# Patient Record
Sex: Male | Born: 1940
Health system: Southern US, Community
[De-identification: ages and names within clinical notes are randomized; demographics above are authoritative.]

## PROBLEM LIST (undated history)

## (undated) DIAGNOSIS — K219 Gastro-esophageal reflux disease without esophagitis: Secondary | ICD-10-CM

## (undated) DIAGNOSIS — J45909 Unspecified asthma, uncomplicated: Secondary | ICD-10-CM

## (undated) HISTORY — PX: EYE SURGERY: SHX253

## (undated) HISTORY — PX: HERNIA REPAIR: SHX51

---

## 1998-10-26 ENCOUNTER — Emergency Department (HOSPITAL_COMMUNITY): Admission: EM | Admit: 1998-10-26 | Discharge: 1998-10-27 | Payer: Self-pay

## 2000-07-13 ENCOUNTER — Ambulatory Visit (HOSPITAL_COMMUNITY): Admission: RE | Admit: 2000-07-13 | Discharge: 2000-07-13 | Payer: Self-pay | Admitting: *Deleted

## 2001-10-19 ENCOUNTER — Ambulatory Visit (HOSPITAL_COMMUNITY): Admission: RE | Admit: 2001-10-19 | Discharge: 2001-10-19 | Payer: Self-pay | Admitting: Gastroenterology

## 2005-11-23 ENCOUNTER — Ambulatory Visit (HOSPITAL_COMMUNITY): Admission: RE | Admit: 2005-11-23 | Discharge: 2005-11-23 | Payer: Self-pay | Admitting: General Surgery

## 2015-01-11 ENCOUNTER — Ambulatory Visit (INDEPENDENT_AMBULATORY_CARE_PROVIDER_SITE_OTHER): Payer: Medicare Other | Admitting: Family Medicine

## 2015-01-11 VITALS — BP 142/88 | HR 72 | Temp 98.0°F | Resp 18 | Ht 72.0 in | Wt 177.0 lb

## 2015-01-11 DIAGNOSIS — M79645 Pain in left finger(s): Secondary | ICD-10-CM

## 2015-01-11 DIAGNOSIS — L03012 Cellulitis of left finger: Secondary | ICD-10-CM

## 2015-01-11 MED ORDER — DOXYCYCLINE HYCLATE 100 MG PO TABS: 100.0000 mg | ORAL_TABLET | Freq: Two times a day (BID) | ORAL | Status: DC

## 2015-01-11 NOTE — Progress Notes (Signed)
° °  Subjective:    Patient ID: Chad Washington, male    DOB: 1941-12-20, 74 y.o.   MRN: [id number] This chart was scribed for Robyn Haber, MD by Marti Sleigh, Medical Scribe. This patient was seen in Room 13 and the patient's care was started a 8:33 AM.  Chief Complaint  Patient presents with   Hand Pain    left thumb, banged it couple of days ago, swelling since yesterday    HPI HPI Comments: Chad Washington is a 74 y.o. male who presents to [facility] complaining of an injury to his left thumb that happened two days ago. Pt endorses associated pain, swelling, discoloration, sleep disturbance due to pain, and limited ROM.  Pt is a retired Psychologist, sport and exercise at [employer].    Review of Systems  Constitutional: Negative for fever and chills.  Musculoskeletal: Positive for joint swelling.  Skin: Positive for color change and wound.  Psychiatric/Behavioral: Positive for sleep disturbance.       Objective:   Physical Exam  Constitutional: He is oriented to person, place, and time. He appears well-developed and well-nourished.  HENT:  Head: Normocephalic and atraumatic.  Eyes: Pupils are equal, round, and reactive to light.  Neck: No JVD present.  Cardiovascular: Normal rate and regular rhythm.   Pulmonary/Chest: Effort normal and breath sounds normal. No respiratory distress.  Neurological: He is alert and oriented to person, place, and time.  Skin: Skin is warm and dry.  Psychiatric: He has a normal mood and affect. His behavior is normal.  Nursing note and vitals reviewed. left thumb paronychia, quite large, I&D'd yielding copious yellow pus and patient relief     Assessment & Plan:   This chart was scribed in my presence and reviewed by me personally.    ICD-9-CM ICD-10-CM   1. Paronychia, left 681.9 L03.012 doxycycline (VIBRA-TABS) 100 MG tablet  2. Pain of left thumb 729.5 M79.645      Signed, Robyn Haber, MD

## 2015-01-11 NOTE — Patient Instructions (Signed)

## 2015-08-25 ENCOUNTER — Other Ambulatory Visit: Payer: Self-pay | Admitting: Internal Medicine

## 2015-08-25 ENCOUNTER — Ambulatory Visit
Admission: RE | Admit: 2015-08-25 | Discharge: 2015-08-25 | Disposition: A | Discharge status: Home / Self Care | Payer: Medicare Other | Source: Ambulatory Visit | Attending: Internal Medicine | Admitting: Internal Medicine

## 2015-08-25 DIAGNOSIS — R059 Cough, unspecified: Secondary | ICD-10-CM

## 2015-08-25 DIAGNOSIS — R05 Cough: Secondary | ICD-10-CM

## 2017-04-08 ENCOUNTER — Other Ambulatory Visit: Payer: Self-pay | Admitting: Gastroenterology

## 2017-06-15 ENCOUNTER — Encounter (HOSPITAL_COMMUNITY): Payer: Self-pay | Admitting: *Deleted

## 2017-06-17 ENCOUNTER — Encounter (HOSPITAL_COMMUNITY): Payer: Self-pay | Admitting: *Deleted

## 2017-06-21 ENCOUNTER — Ambulatory Visit (HOSPITAL_COMMUNITY): Payer: Medicare Other | Admitting: Anesthesiology

## 2017-06-21 ENCOUNTER — Ambulatory Visit (HOSPITAL_COMMUNITY)
Admission: RE | Admit: 2017-06-21 | Discharge: 2017-06-21 | Disposition: A | Discharge status: Home / Self Care | Payer: Medicare Other | Source: Ambulatory Visit | Attending: Gastroenterology | Admitting: Gastroenterology

## 2017-06-21 ENCOUNTER — Encounter (HOSPITAL_COMMUNITY): Payer: Self-pay | Admitting: Anesthesiology

## 2017-06-21 ENCOUNTER — Encounter (HOSPITAL_COMMUNITY)
Admission: RE | Disposition: A | Discharge status: Home / Self Care | Payer: Self-pay | Source: Ambulatory Visit | Attending: Gastroenterology

## 2017-06-21 DIAGNOSIS — L719 Rosacea, unspecified: Secondary | ICD-10-CM | POA: Diagnosis not present

## 2017-06-21 DIAGNOSIS — Z1211 Encounter for screening for malignant neoplasm of colon: Secondary | ICD-10-CM | POA: Insufficient documentation

## 2017-06-21 DIAGNOSIS — Z79899 Other long term (current) drug therapy: Secondary | ICD-10-CM | POA: Insufficient documentation

## 2017-06-21 DIAGNOSIS — D122 Benign neoplasm of ascending colon: Secondary | ICD-10-CM | POA: Diagnosis not present

## 2017-06-21 DIAGNOSIS — K219 Gastro-esophageal reflux disease without esophagitis: Secondary | ICD-10-CM | POA: Insufficient documentation

## 2017-06-21 DIAGNOSIS — Z7982 Long term (current) use of aspirin: Secondary | ICD-10-CM | POA: Insufficient documentation

## 2017-06-21 HISTORY — DX: Unspecified asthma, uncomplicated: J45.909

## 2017-06-21 HISTORY — PX: COLONOSCOPY WITH PROPOFOL: SHX5780

## 2017-06-21 HISTORY — DX: Gastro-esophageal reflux disease without esophagitis: K21.9

## 2017-06-21 SURGERY — COLONOSCOPY WITH PROPOFOL
Anesthesia: Monitor Anesthesia Care

## 2017-06-21 MED ORDER — PROPOFOL 500 MG/50ML IV EMUL
INTRAVENOUS | Status: DC | PRN
  Administered 2017-06-21: 140 ug/kg/min via INTRAVENOUS

## 2017-06-21 MED ORDER — SODIUM CHLORIDE 0.9 % IV SOLN: INTRAVENOUS | Status: DC

## 2017-06-21 MED ORDER — PROPOFOL 10 MG/ML IV BOLUS
INTRAVENOUS | Status: AC
  Filled 2017-06-21: qty 20

## 2017-06-21 MED ORDER — PROPOFOL 10 MG/ML IV BOLUS
INTRAVENOUS | Status: DC | PRN
  Administered 2017-06-21 (×2): 20 mg via INTRAVENOUS

## 2017-06-21 MED ORDER — LACTATED RINGERS IV SOLN
INTRAVENOUS | Status: DC
  Administered 2017-06-21: 1000 mL via INTRAVENOUS

## 2017-06-21 MED ORDER — LIDOCAINE 2% (20 MG/ML) 5 ML SYRINGE
INTRAMUSCULAR | Status: AC
  Filled 2017-06-21: qty 5

## 2017-06-21 SURGICAL SUPPLY — 21 items

## 2017-06-21 NOTE — Discharge Instructions (Signed)
Colonoscopy, Adult, Care After  This sheet gives you information about how to care for yourself after your procedure. Your health care provider may also give you more specific instructions. If you have problems or questions, contact your health care provider.  What can I expect after the procedure?  After the procedure, it is common to have:  · A small amount of blood in your stool for 24 hours after the procedure.  · Some gas.  · Mild abdominal cramping or bloating.    Follow these instructions at home:  General instructions    · For the first 24 hours after the procedure:  ? Do not drive or use machinery.  ? Do not sign important documents.  ? Do not drink alcohol.  ? Do your regular daily activities at a slower pace than normal.  ? Eat soft, easy-to-digest foods.  ? Rest often.  · Take over-the-counter or prescription medicines only as told by your health care provider.  · It is up to you to get the results of your procedure. Ask your health care provider, or the department performing the procedure, when your results will be ready.  Relieving cramping and bloating  · Try walking around when you have cramps or feel bloated.  · Apply heat to your abdomen as told by your health care provider. Use a heat source that your health care provider recommends, such as a moist heat pack or a heating pad.  ? Place a towel between your skin and the heat source.  ? Leave the heat on for 20-30 minutes.  ? Remove the heat if your skin turns bright red. This is especially important if you are unable to feel pain, heat, or cold. You may have a greater risk of getting burned.  Eating and drinking  · Drink enough fluid to keep your urine clear or pale yellow.  · Resume your normal diet as instructed by your health care provider. Avoid heavy or fried foods that are hard to digest.  · Avoid drinking alcohol for as long as instructed by your health care provider.  Contact a health care provider if:  · You have blood in your stool 2-3  days after the procedure.  Get help right away if:  · You have more than a small spotting of blood in your stool.  · You pass large blood clots in your stool.  · Your abdomen is swollen.  · You have nausea or vomiting.  · You have a fever.  · You have increasing abdominal pain that is not relieved with medicine.  This information is not intended to replace advice given to you by your health care provider. Make sure you discuss any questions you have with your health care provider.  Document Released: 07/27/2004 Document Revised: 09/06/2016 Document Reviewed: 02/24/2016  Elsevier Interactive Patient Education © 2018 Elsevier Inc.

## 2017-06-21 NOTE — Op Note (Signed)
[facility] Patient Name: Chad Washington Procedure Date: 06/21/2017 MRN: [id number] Attending MD: [name] , MD Date of Birth: 10/24/1941 CSN: [id number] Age: 76 Admit Type: Outpatient Procedure:                Colonoscopy Indications:              Screening for colorectal malignant neoplasm. Normal                            screening colonoscopy was performed on 01/05/2007. Providers:                [name], MD, Laverta Baltimore RN, RN,                            [name], Technician, Danley Danker, CRNA Referring MD:              Medicines:                Propofol per Anesthesia Complications:            No immediate complications. Estimated Blood Loss:     Estimated blood loss was minimal. Procedure:                Pre-Anesthesia Assessment:                           - Prior to the procedure, a History and Physical                            was performed, and patient medications and                            allergies were reviewed. The patient's tolerance of                            previous anesthesia was also reviewed. The risks                            and benefits of the procedure and the sedation                            options and risks were discussed with the patient.                            All questions were answered, and informed consent                            was obtained. Prior Anticoagulants: The patient has                            taken aspirin, last dose was 1 day prior to                            procedure. ASA Grade Assessment: II - A patient  with mild systemic disease. After reviewing the                            risks and benefits, the patient was deemed in                            satisfactory condition to undergo the procedure.                           After obtaining informed consent, the colonoscope                            was passed under direct vision.  Throughout the                            procedure, the patient's blood pressure, pulse, and                            oxygen saturations were monitored continuously. The                            EC-[id number] ([id number]) scope was introduced through                            the anus and advanced to the the cecum, identified                            by appendiceal orifice and ileocecal valve. The                            colonoscopy was performed without difficulty. The                            patient tolerated the procedure well. The quality                            of the bowel preparation was good. The appendiceal                            orifice and the rectum were photographed. Scope In: 7:25:04 AM Scope Out: 7:42:24 AM Scope Withdrawal Time: 0 hours 9 minutes 47 seconds  Total Procedure Duration: 0 hours 17 minutes 20 seconds  Findings:      The perianal and digital rectal examinations were normal.      A 3 mm polyp was found in the mid ascending colon. The polyp was       sessile. The polyp was removed with a cold biopsy forceps. Resection and       retrieval were complete.      The exam was otherwise without abnormality. Universal colonic       diverticulosis was present. Impression:               - One 3 mm polyp in the mid ascending colon,  removed with a cold biopsy forceps. Resected and                            retrieved.                           - The examination was otherwise normal. Moderate Sedation:      N/A- Per Anesthesia Care Recommendation:           - Patient has a contact number available for                            emergencies. The signs and symptoms of potential                            delayed complications were discussed with the                            patient. Return to normal activities tomorrow.                            Written discharge instructions were provided to the                             patient.                           - Repeat colonoscopy is not recommended for                            screening purposes.                           - Resume previous diet.                           - Continue present medications. Procedure Code(s):        --- Professional ---                           [phone], Colonoscopy, flexible; with biopsy, single                            or multiple Diagnosis Code(s):        --- Professional ---                           Z12.11, Encounter for screening for malignant                            neoplasm of colon                           D12.2, Benign neoplasm of ascending colon CPT copyright 2016 American Medical Association. All rights reserved. The codes documented in this report are preliminary and upon coder review may  be revised to meet current compliance requirements. Earle Gell, MD [name], MD 06/21/2017  7:49:20 AM This report has been signed electronically. Number of Addenda: 0

## 2017-06-21 NOTE — Anesthesia Preprocedure Evaluation (Signed)
Anesthesia Evaluation  Patient identified by MRN, date of birth, ID band Patient awake    Reviewed: Allergy & Precautions, NPO status , Patient's Chart, lab work & pertinent test results  Airway Mallampati: I       Dental no notable dental hx. (+) Teeth Intact   Pulmonary    Pulmonary exam normal breath sounds clear to auscultation       Cardiovascular negative cardio ROS Normal cardiovascular exam Rhythm:Regular Rate:Normal     Neuro/Psych negative neurological ROS  negative psych ROS   GI/Hepatic Neg liver ROS,   Endo/Other  negative endocrine ROS  Renal/GU negative Renal ROS  negative genitourinary   Musculoskeletal negative musculoskeletal ROS (+)   Abdominal Normal abdominal exam  (+)   Peds  Hematology negative hematology ROS (+)   Anesthesia Other Findings   Reproductive/Obstetrics                             Anesthesia Physical Anesthesia Plan  ASA: II  Anesthesia Plan: MAC   Post-op Pain Management:    Induction:   PONV Risk Score and Plan: 1 and Ondansetron, Dexamethasone and Treatment may vary due to age or medical condition  Airway Management Planned: Natural Airway and Simple Face Mask  Additional Equipment:   Intra-op Plan:   Post-operative Plan:   Informed Consent: I have reviewed the patients History and Physical, chart, labs and discussed the procedure including the risks, benefits and alternatives for the proposed anesthesia with the patient or authorized representative who has indicated his/her understanding and acceptance.     Plan Discussed with: CRNA and Surgeon  Anesthesia Plan Comments:         Anesthesia Quick Evaluation

## 2017-06-21 NOTE — Transfer of Care (Signed)
Immediate Anesthesia Transfer of Care Note  Patient: Chad Washington  Procedure(s) Performed: Procedure(s): COLONOSCOPY WITH PROPOFOL (N/A)  Patient Location: Endoscopy Unit  Anesthesia Type:MAC  Level of Consciousness: sedated  Airway & Oxygen Therapy: Patient Spontanous Breathing and Patient connected to face mask oxygen  Post-op Assessment: Report given to RN and Post -op Vital signs reviewed and stable  Post vital signs: Reviewed and stable  Last Vitals:  Vitals:   06/21/17 0647  BP: (!) 148/85  Pulse: 75  Resp: 16  Temp: 36.4 C    Last Pain:  Vitals:   06/21/17 0647  TempSrc: Oral         Complications: No apparent anesthesia complications

## 2017-06-21 NOTE — H&P (Signed)
Procedure: Screening colonoscopy. Normal screening colonoscopy was performed on 01/05/2007  History: The patient is a 76 year old male born 1941-10-04. He is scheduled to undergo a repeat screening colonoscopy today.  Past medical history: Gastroesophageal reflux. Rosacea. Tonsillectomy. Right inguinal hernia repair as a child. Left inguinal hernia repair performed in 2006. Sinus surgery. Bilateral cataract surgery.  Medication allergies: Latex gloves  Exam: Patient is alert and lying comfortably on the endoscopy stretcher. Abdomen is soft and nontender to palpation. Lungs are clear to auscultation. Cardiac exam reveals a regular rhythm.  Plan: Proceed with screening colonoscopy

## 2017-06-21 NOTE — Anesthesia Postprocedure Evaluation (Signed)
Anesthesia Post Note  Patient: Chad Washington  Procedure(s) Performed: Procedure(s) (LRB): COLONOSCOPY WITH PROPOFOL (N/A)     Patient location during evaluation: Endoscopy Anesthesia Type: MAC Level of consciousness: awake Pain management: pain level controlled Vital Signs Assessment: post-procedure vital signs reviewed and stable Respiratory status: spontaneous breathing Cardiovascular status: stable Postop Assessment: no signs of nausea or vomiting Anesthetic complications: no    Last Vitals:  Vitals:   06/21/17 0800 06/21/17 0810  BP: 114/70 126/72  Pulse: (!) 58 70  Resp: 16 13  Temp:      Last Pain:  Vitals:   06/21/17 0748  TempSrc: Oral   Pain Goal:                 Maximus Hoffert JR,JOHN Michaelangelo Mittelman

## 2017-06-24 ENCOUNTER — Encounter (HOSPITAL_COMMUNITY): Payer: Self-pay | Admitting: Gastroenterology

## 2017-11-15 ENCOUNTER — Ambulatory Visit
Admission: RE | Admit: 2017-11-15 | Discharge: 2017-11-15 | Disposition: A | Discharge status: Home / Self Care | Payer: Medicare Other | Source: Ambulatory Visit | Attending: Internal Medicine | Admitting: Internal Medicine

## 2017-11-15 ENCOUNTER — Other Ambulatory Visit: Payer: Self-pay | Admitting: Internal Medicine

## 2017-11-15 DIAGNOSIS — M545 Low back pain: Secondary | ICD-10-CM

## 2017-11-25 ENCOUNTER — Other Ambulatory Visit: Payer: Self-pay | Admitting: Internal Medicine

## 2017-11-25 DIAGNOSIS — M5416 Radiculopathy, lumbar region: Secondary | ICD-10-CM

## 2017-12-02 ENCOUNTER — Ambulatory Visit
Admission: RE | Admit: 2017-12-02 | Discharge: 2017-12-02 | Disposition: A | Discharge status: Home / Self Care | Payer: Medicare Other | Source: Ambulatory Visit | Attending: Internal Medicine | Admitting: Internal Medicine

## 2017-12-02 DIAGNOSIS — M5416 Radiculopathy, lumbar region: Secondary | ICD-10-CM

## 2020-05-02 DIAGNOSIS — J3089 Other allergic rhinitis: Secondary | ICD-10-CM | POA: Diagnosis not present

## 2020-05-02 DIAGNOSIS — J3081 Allergic rhinitis due to animal (cat) (dog) hair and dander: Secondary | ICD-10-CM | POA: Diagnosis not present

## 2020-05-02 DIAGNOSIS — J301 Allergic rhinitis due to pollen: Secondary | ICD-10-CM | POA: Diagnosis not present

## 2020-05-12 DIAGNOSIS — J3089 Other allergic rhinitis: Secondary | ICD-10-CM | POA: Diagnosis not present

## 2020-05-12 DIAGNOSIS — J3081 Allergic rhinitis due to animal (cat) (dog) hair and dander: Secondary | ICD-10-CM | POA: Diagnosis not present

## 2020-05-12 DIAGNOSIS — J301 Allergic rhinitis due to pollen: Secondary | ICD-10-CM | POA: Diagnosis not present

## 2020-05-13 DIAGNOSIS — J3089 Other allergic rhinitis: Secondary | ICD-10-CM | POA: Diagnosis not present

## 2020-05-13 DIAGNOSIS — J301 Allergic rhinitis due to pollen: Secondary | ICD-10-CM | POA: Diagnosis not present

## 2020-05-13 DIAGNOSIS — J3081 Allergic rhinitis due to animal (cat) (dog) hair and dander: Secondary | ICD-10-CM | POA: Diagnosis not present

## 2020-05-20 DIAGNOSIS — J3081 Allergic rhinitis due to animal (cat) (dog) hair and dander: Secondary | ICD-10-CM | POA: Diagnosis not present

## 2020-05-20 DIAGNOSIS — J3089 Other allergic rhinitis: Secondary | ICD-10-CM | POA: Diagnosis not present

## 2020-05-20 DIAGNOSIS — J301 Allergic rhinitis due to pollen: Secondary | ICD-10-CM | POA: Diagnosis not present

## 2020-05-28 DIAGNOSIS — J301 Allergic rhinitis due to pollen: Secondary | ICD-10-CM | POA: Diagnosis not present

## 2020-05-28 DIAGNOSIS — J3089 Other allergic rhinitis: Secondary | ICD-10-CM | POA: Diagnosis not present

## 2020-05-28 DIAGNOSIS — J3081 Allergic rhinitis due to animal (cat) (dog) hair and dander: Secondary | ICD-10-CM | POA: Diagnosis not present

## 2020-06-04 DIAGNOSIS — J301 Allergic rhinitis due to pollen: Secondary | ICD-10-CM | POA: Diagnosis not present

## 2020-06-04 DIAGNOSIS — J3081 Allergic rhinitis due to animal (cat) (dog) hair and dander: Secondary | ICD-10-CM | POA: Diagnosis not present

## 2020-06-04 DIAGNOSIS — J3089 Other allergic rhinitis: Secondary | ICD-10-CM | POA: Diagnosis not present

## 2020-06-11 DIAGNOSIS — J301 Allergic rhinitis due to pollen: Secondary | ICD-10-CM | POA: Diagnosis not present

## 2020-06-11 DIAGNOSIS — J3081 Allergic rhinitis due to animal (cat) (dog) hair and dander: Secondary | ICD-10-CM | POA: Diagnosis not present

## 2020-06-11 DIAGNOSIS — J3089 Other allergic rhinitis: Secondary | ICD-10-CM | POA: Diagnosis not present

## 2020-06-19 DIAGNOSIS — J301 Allergic rhinitis due to pollen: Secondary | ICD-10-CM | POA: Diagnosis not present

## 2020-06-19 DIAGNOSIS — J3089 Other allergic rhinitis: Secondary | ICD-10-CM | POA: Diagnosis not present

## 2020-06-19 DIAGNOSIS — J3081 Allergic rhinitis due to animal (cat) (dog) hair and dander: Secondary | ICD-10-CM | POA: Diagnosis not present

## 2020-06-26 DIAGNOSIS — J3089 Other allergic rhinitis: Secondary | ICD-10-CM | POA: Diagnosis not present

## 2020-06-26 DIAGNOSIS — J301 Allergic rhinitis due to pollen: Secondary | ICD-10-CM | POA: Diagnosis not present

## 2020-06-26 DIAGNOSIS — J3081 Allergic rhinitis due to animal (cat) (dog) hair and dander: Secondary | ICD-10-CM | POA: Diagnosis not present

## 2020-07-03 DIAGNOSIS — J301 Allergic rhinitis due to pollen: Secondary | ICD-10-CM | POA: Diagnosis not present

## 2020-07-03 DIAGNOSIS — J3089 Other allergic rhinitis: Secondary | ICD-10-CM | POA: Diagnosis not present

## 2020-07-03 DIAGNOSIS — J3081 Allergic rhinitis due to animal (cat) (dog) hair and dander: Secondary | ICD-10-CM | POA: Diagnosis not present

## 2020-07-11 DIAGNOSIS — J3081 Allergic rhinitis due to animal (cat) (dog) hair and dander: Secondary | ICD-10-CM | POA: Diagnosis not present

## 2020-07-11 DIAGNOSIS — J3089 Other allergic rhinitis: Secondary | ICD-10-CM | POA: Diagnosis not present

## 2020-07-11 DIAGNOSIS — J301 Allergic rhinitis due to pollen: Secondary | ICD-10-CM | POA: Diagnosis not present

## 2020-07-18 DIAGNOSIS — J3081 Allergic rhinitis due to animal (cat) (dog) hair and dander: Secondary | ICD-10-CM | POA: Diagnosis not present

## 2020-07-18 DIAGNOSIS — J301 Allergic rhinitis due to pollen: Secondary | ICD-10-CM | POA: Diagnosis not present

## 2020-07-18 DIAGNOSIS — J3089 Other allergic rhinitis: Secondary | ICD-10-CM | POA: Diagnosis not present

## 2020-07-24 DIAGNOSIS — J301 Allergic rhinitis due to pollen: Secondary | ICD-10-CM | POA: Diagnosis not present

## 2020-07-24 DIAGNOSIS — J3089 Other allergic rhinitis: Secondary | ICD-10-CM | POA: Diagnosis not present

## 2020-07-24 DIAGNOSIS — J3081 Allergic rhinitis due to animal (cat) (dog) hair and dander: Secondary | ICD-10-CM | POA: Diagnosis not present

## 2020-07-31 DIAGNOSIS — J301 Allergic rhinitis due to pollen: Secondary | ICD-10-CM | POA: Diagnosis not present

## 2020-07-31 DIAGNOSIS — J3081 Allergic rhinitis due to animal (cat) (dog) hair and dander: Secondary | ICD-10-CM | POA: Diagnosis not present

## 2020-07-31 DIAGNOSIS — J3089 Other allergic rhinitis: Secondary | ICD-10-CM | POA: Diagnosis not present

## 2020-08-11 DIAGNOSIS — Z961 Presence of intraocular lens: Secondary | ICD-10-CM | POA: Diagnosis not present

## 2020-08-11 DIAGNOSIS — J3081 Allergic rhinitis due to animal (cat) (dog) hair and dander: Secondary | ICD-10-CM | POA: Diagnosis not present

## 2020-08-11 DIAGNOSIS — J301 Allergic rhinitis due to pollen: Secondary | ICD-10-CM | POA: Diagnosis not present

## 2020-08-11 DIAGNOSIS — J3089 Other allergic rhinitis: Secondary | ICD-10-CM | POA: Diagnosis not present

## 2020-08-11 DIAGNOSIS — H43393 Other vitreous opacities, bilateral: Secondary | ICD-10-CM | POA: Diagnosis not present

## 2020-08-20 DIAGNOSIS — J3081 Allergic rhinitis due to animal (cat) (dog) hair and dander: Secondary | ICD-10-CM | POA: Diagnosis not present

## 2020-08-20 DIAGNOSIS — J301 Allergic rhinitis due to pollen: Secondary | ICD-10-CM | POA: Diagnosis not present

## 2020-08-20 DIAGNOSIS — J3089 Other allergic rhinitis: Secondary | ICD-10-CM | POA: Diagnosis not present

## 2020-09-02 DIAGNOSIS — J301 Allergic rhinitis due to pollen: Secondary | ICD-10-CM | POA: Diagnosis not present

## 2020-09-02 DIAGNOSIS — J3089 Other allergic rhinitis: Secondary | ICD-10-CM | POA: Diagnosis not present

## 2020-09-11 DIAGNOSIS — J3081 Allergic rhinitis due to animal (cat) (dog) hair and dander: Secondary | ICD-10-CM | POA: Diagnosis not present

## 2020-09-11 DIAGNOSIS — J3089 Other allergic rhinitis: Secondary | ICD-10-CM | POA: Diagnosis not present

## 2020-09-11 DIAGNOSIS — J301 Allergic rhinitis due to pollen: Secondary | ICD-10-CM | POA: Diagnosis not present

## 2020-09-17 DIAGNOSIS — J3081 Allergic rhinitis due to animal (cat) (dog) hair and dander: Secondary | ICD-10-CM | POA: Diagnosis not present

## 2020-09-17 DIAGNOSIS — J301 Allergic rhinitis due to pollen: Secondary | ICD-10-CM | POA: Diagnosis not present

## 2020-09-17 DIAGNOSIS — J3089 Other allergic rhinitis: Secondary | ICD-10-CM | POA: Diagnosis not present

## 2020-09-23 DIAGNOSIS — J3089 Other allergic rhinitis: Secondary | ICD-10-CM | POA: Diagnosis not present

## 2020-09-23 DIAGNOSIS — J301 Allergic rhinitis due to pollen: Secondary | ICD-10-CM | POA: Diagnosis not present

## 2020-09-23 DIAGNOSIS — R05 Cough: Secondary | ICD-10-CM | POA: Diagnosis not present

## 2020-09-23 DIAGNOSIS — J3081 Allergic rhinitis due to animal (cat) (dog) hair and dander: Secondary | ICD-10-CM | POA: Diagnosis not present

## 2020-10-06 DIAGNOSIS — L570 Actinic keratosis: Secondary | ICD-10-CM | POA: Diagnosis not present

## 2020-10-06 DIAGNOSIS — L905 Scar conditions and fibrosis of skin: Secondary | ICD-10-CM | POA: Diagnosis not present

## 2020-10-06 DIAGNOSIS — L814 Other melanin hyperpigmentation: Secondary | ICD-10-CM | POA: Diagnosis not present

## 2020-10-06 DIAGNOSIS — D229 Melanocytic nevi, unspecified: Secondary | ICD-10-CM | POA: Diagnosis not present

## 2020-10-06 DIAGNOSIS — B351 Tinea unguium: Secondary | ICD-10-CM | POA: Diagnosis not present

## 2020-10-06 DIAGNOSIS — Z85828 Personal history of other malignant neoplasm of skin: Secondary | ICD-10-CM | POA: Diagnosis not present

## 2020-10-06 DIAGNOSIS — D1801 Hemangioma of skin and subcutaneous tissue: Secondary | ICD-10-CM | POA: Diagnosis not present

## 2020-10-06 DIAGNOSIS — L819 Disorder of pigmentation, unspecified: Secondary | ICD-10-CM | POA: Diagnosis not present

## 2020-10-06 DIAGNOSIS — L821 Other seborrheic keratosis: Secondary | ICD-10-CM | POA: Diagnosis not present

## 2020-10-13 DIAGNOSIS — J3081 Allergic rhinitis due to animal (cat) (dog) hair and dander: Secondary | ICD-10-CM | POA: Diagnosis not present

## 2020-10-13 DIAGNOSIS — J301 Allergic rhinitis due to pollen: Secondary | ICD-10-CM | POA: Diagnosis not present

## 2020-10-13 DIAGNOSIS — J3089 Other allergic rhinitis: Secondary | ICD-10-CM | POA: Diagnosis not present

## 2020-10-20 DIAGNOSIS — Z0000 Encounter for general adult medical examination without abnormal findings: Secondary | ICD-10-CM | POA: Diagnosis not present

## 2020-10-20 DIAGNOSIS — K589 Irritable bowel syndrome without diarrhea: Secondary | ICD-10-CM | POA: Diagnosis not present

## 2020-10-20 DIAGNOSIS — E7800 Pure hypercholesterolemia, unspecified: Secondary | ICD-10-CM | POA: Diagnosis not present

## 2020-10-20 DIAGNOSIS — J309 Allergic rhinitis, unspecified: Secondary | ICD-10-CM | POA: Diagnosis not present

## 2020-10-20 DIAGNOSIS — N400 Enlarged prostate without lower urinary tract symptoms: Secondary | ICD-10-CM | POA: Diagnosis not present

## 2020-10-20 DIAGNOSIS — K219 Gastro-esophageal reflux disease without esophagitis: Secondary | ICD-10-CM | POA: Diagnosis not present

## 2020-10-23 DIAGNOSIS — J3081 Allergic rhinitis due to animal (cat) (dog) hair and dander: Secondary | ICD-10-CM | POA: Diagnosis not present

## 2020-10-23 DIAGNOSIS — J3089 Other allergic rhinitis: Secondary | ICD-10-CM | POA: Diagnosis not present

## 2020-10-23 DIAGNOSIS — J301 Allergic rhinitis due to pollen: Secondary | ICD-10-CM | POA: Diagnosis not present

## 2020-10-31 DIAGNOSIS — J3081 Allergic rhinitis due to animal (cat) (dog) hair and dander: Secondary | ICD-10-CM | POA: Diagnosis not present

## 2020-10-31 DIAGNOSIS — J301 Allergic rhinitis due to pollen: Secondary | ICD-10-CM | POA: Diagnosis not present

## 2020-10-31 DIAGNOSIS — J3089 Other allergic rhinitis: Secondary | ICD-10-CM | POA: Diagnosis not present

## 2020-11-12 DIAGNOSIS — J0191 Acute recurrent sinusitis, unspecified: Secondary | ICD-10-CM | POA: Diagnosis not present

## 2020-11-14 DIAGNOSIS — J301 Allergic rhinitis due to pollen: Secondary | ICD-10-CM | POA: Diagnosis not present

## 2020-11-14 DIAGNOSIS — J3089 Other allergic rhinitis: Secondary | ICD-10-CM | POA: Diagnosis not present

## 2020-11-14 DIAGNOSIS — J3081 Allergic rhinitis due to animal (cat) (dog) hair and dander: Secondary | ICD-10-CM | POA: Diagnosis not present

## 2020-11-27 DIAGNOSIS — J3089 Other allergic rhinitis: Secondary | ICD-10-CM | POA: Diagnosis not present

## 2020-11-27 DIAGNOSIS — J3081 Allergic rhinitis due to animal (cat) (dog) hair and dander: Secondary | ICD-10-CM | POA: Diagnosis not present

## 2020-11-27 DIAGNOSIS — J301 Allergic rhinitis due to pollen: Secondary | ICD-10-CM | POA: Diagnosis not present

## 2020-12-05 DIAGNOSIS — J3081 Allergic rhinitis due to animal (cat) (dog) hair and dander: Secondary | ICD-10-CM | POA: Diagnosis not present

## 2020-12-05 DIAGNOSIS — J3089 Other allergic rhinitis: Secondary | ICD-10-CM | POA: Diagnosis not present

## 2020-12-05 DIAGNOSIS — J301 Allergic rhinitis due to pollen: Secondary | ICD-10-CM | POA: Diagnosis not present

## 2020-12-12 DIAGNOSIS — J3089 Other allergic rhinitis: Secondary | ICD-10-CM | POA: Diagnosis not present

## 2020-12-12 DIAGNOSIS — J3081 Allergic rhinitis due to animal (cat) (dog) hair and dander: Secondary | ICD-10-CM | POA: Diagnosis not present

## 2020-12-12 DIAGNOSIS — J301 Allergic rhinitis due to pollen: Secondary | ICD-10-CM | POA: Diagnosis not present

## 2020-12-15 DIAGNOSIS — J301 Allergic rhinitis due to pollen: Secondary | ICD-10-CM | POA: Diagnosis not present

## 2020-12-15 DIAGNOSIS — J3081 Allergic rhinitis due to animal (cat) (dog) hair and dander: Secondary | ICD-10-CM | POA: Diagnosis not present

## 2020-12-15 DIAGNOSIS — J3089 Other allergic rhinitis: Secondary | ICD-10-CM | POA: Diagnosis not present

## 2020-12-18 DIAGNOSIS — J3081 Allergic rhinitis due to animal (cat) (dog) hair and dander: Secondary | ICD-10-CM | POA: Diagnosis not present

## 2020-12-18 DIAGNOSIS — J3089 Other allergic rhinitis: Secondary | ICD-10-CM | POA: Diagnosis not present

## 2020-12-18 DIAGNOSIS — J301 Allergic rhinitis due to pollen: Secondary | ICD-10-CM | POA: Diagnosis not present

## 2021-01-01 DIAGNOSIS — J3081 Allergic rhinitis due to animal (cat) (dog) hair and dander: Secondary | ICD-10-CM | POA: Diagnosis not present

## 2021-01-01 DIAGNOSIS — J3089 Other allergic rhinitis: Secondary | ICD-10-CM | POA: Diagnosis not present

## 2021-01-01 DIAGNOSIS — J301 Allergic rhinitis due to pollen: Secondary | ICD-10-CM | POA: Diagnosis not present

## 2021-01-08 DIAGNOSIS — J3081 Allergic rhinitis due to animal (cat) (dog) hair and dander: Secondary | ICD-10-CM | POA: Diagnosis not present

## 2021-01-08 DIAGNOSIS — J3089 Other allergic rhinitis: Secondary | ICD-10-CM | POA: Diagnosis not present

## 2021-01-08 DIAGNOSIS — J301 Allergic rhinitis due to pollen: Secondary | ICD-10-CM | POA: Diagnosis not present

## 2021-01-22 DIAGNOSIS — J3081 Allergic rhinitis due to animal (cat) (dog) hair and dander: Secondary | ICD-10-CM | POA: Diagnosis not present

## 2021-01-22 DIAGNOSIS — J3089 Other allergic rhinitis: Secondary | ICD-10-CM | POA: Diagnosis not present

## 2021-01-22 DIAGNOSIS — J301 Allergic rhinitis due to pollen: Secondary | ICD-10-CM | POA: Diagnosis not present

## 2021-01-29 DIAGNOSIS — J3089 Other allergic rhinitis: Secondary | ICD-10-CM | POA: Diagnosis not present

## 2021-01-29 DIAGNOSIS — J301 Allergic rhinitis due to pollen: Secondary | ICD-10-CM | POA: Diagnosis not present

## 2021-01-29 DIAGNOSIS — J3081 Allergic rhinitis due to animal (cat) (dog) hair and dander: Secondary | ICD-10-CM | POA: Diagnosis not present

## 2021-02-04 DIAGNOSIS — H00014 Hordeolum externum left upper eyelid: Secondary | ICD-10-CM | POA: Diagnosis not present

## 2021-02-04 DIAGNOSIS — B309 Viral conjunctivitis, unspecified: Secondary | ICD-10-CM | POA: Diagnosis not present

## 2021-02-11 DIAGNOSIS — L819 Disorder of pigmentation, unspecified: Secondary | ICD-10-CM | POA: Diagnosis not present

## 2021-02-11 DIAGNOSIS — L821 Other seborrheic keratosis: Secondary | ICD-10-CM | POA: Diagnosis not present

## 2021-02-11 DIAGNOSIS — L905 Scar conditions and fibrosis of skin: Secondary | ICD-10-CM | POA: Diagnosis not present

## 2021-02-11 DIAGNOSIS — L820 Inflamed seborrheic keratosis: Secondary | ICD-10-CM | POA: Diagnosis not present

## 2021-02-11 DIAGNOSIS — L570 Actinic keratosis: Secondary | ICD-10-CM | POA: Diagnosis not present

## 2021-02-11 DIAGNOSIS — L814 Other melanin hyperpigmentation: Secondary | ICD-10-CM | POA: Diagnosis not present

## 2021-02-11 DIAGNOSIS — Z85828 Personal history of other malignant neoplasm of skin: Secondary | ICD-10-CM | POA: Diagnosis not present

## 2021-02-12 DIAGNOSIS — J301 Allergic rhinitis due to pollen: Secondary | ICD-10-CM | POA: Diagnosis not present

## 2021-02-12 DIAGNOSIS — J3089 Other allergic rhinitis: Secondary | ICD-10-CM | POA: Diagnosis not present

## 2021-02-12 DIAGNOSIS — J3081 Allergic rhinitis due to animal (cat) (dog) hair and dander: Secondary | ICD-10-CM | POA: Diagnosis not present

## 2021-02-19 DIAGNOSIS — J3089 Other allergic rhinitis: Secondary | ICD-10-CM | POA: Diagnosis not present

## 2021-02-19 DIAGNOSIS — J301 Allergic rhinitis due to pollen: Secondary | ICD-10-CM | POA: Diagnosis not present

## 2021-02-26 DIAGNOSIS — J3089 Other allergic rhinitis: Secondary | ICD-10-CM | POA: Diagnosis not present

## 2021-02-26 DIAGNOSIS — J301 Allergic rhinitis due to pollen: Secondary | ICD-10-CM | POA: Diagnosis not present

## 2021-02-26 DIAGNOSIS — J3081 Allergic rhinitis due to animal (cat) (dog) hair and dander: Secondary | ICD-10-CM | POA: Diagnosis not present

## 2021-03-03 DIAGNOSIS — J3089 Other allergic rhinitis: Secondary | ICD-10-CM | POA: Diagnosis not present

## 2021-03-03 DIAGNOSIS — J301 Allergic rhinitis due to pollen: Secondary | ICD-10-CM | POA: Diagnosis not present

## 2021-03-03 DIAGNOSIS — J3081 Allergic rhinitis due to animal (cat) (dog) hair and dander: Secondary | ICD-10-CM | POA: Diagnosis not present

## 2021-03-05 DIAGNOSIS — H1032 Unspecified acute conjunctivitis, left eye: Secondary | ICD-10-CM | POA: Diagnosis not present

## 2021-03-11 DIAGNOSIS — J3081 Allergic rhinitis due to animal (cat) (dog) hair and dander: Secondary | ICD-10-CM | POA: Diagnosis not present

## 2021-03-11 DIAGNOSIS — J3089 Other allergic rhinitis: Secondary | ICD-10-CM | POA: Diagnosis not present

## 2021-03-11 DIAGNOSIS — J301 Allergic rhinitis due to pollen: Secondary | ICD-10-CM | POA: Diagnosis not present

## 2021-03-20 DIAGNOSIS — J3081 Allergic rhinitis due to animal (cat) (dog) hair and dander: Secondary | ICD-10-CM | POA: Diagnosis not present

## 2021-03-20 DIAGNOSIS — J301 Allergic rhinitis due to pollen: Secondary | ICD-10-CM | POA: Diagnosis not present

## 2021-03-20 DIAGNOSIS — J3089 Other allergic rhinitis: Secondary | ICD-10-CM | POA: Diagnosis not present

## 2021-04-03 DIAGNOSIS — J301 Allergic rhinitis due to pollen: Secondary | ICD-10-CM | POA: Diagnosis not present

## 2021-04-03 DIAGNOSIS — J3081 Allergic rhinitis due to animal (cat) (dog) hair and dander: Secondary | ICD-10-CM | POA: Diagnosis not present

## 2021-04-03 DIAGNOSIS — J3089 Other allergic rhinitis: Secondary | ICD-10-CM | POA: Diagnosis not present

## 2021-04-15 DIAGNOSIS — J3081 Allergic rhinitis due to animal (cat) (dog) hair and dander: Secondary | ICD-10-CM | POA: Diagnosis not present

## 2021-04-15 DIAGNOSIS — J3089 Other allergic rhinitis: Secondary | ICD-10-CM | POA: Diagnosis not present

## 2021-04-15 DIAGNOSIS — J301 Allergic rhinitis due to pollen: Secondary | ICD-10-CM | POA: Diagnosis not present

## 2021-04-17 DIAGNOSIS — Z961 Presence of intraocular lens: Secondary | ICD-10-CM | POA: Diagnosis not present

## 2021-04-17 DIAGNOSIS — H43813 Vitreous degeneration, bilateral: Secondary | ICD-10-CM | POA: Diagnosis not present

## 2021-04-30 DIAGNOSIS — J3089 Other allergic rhinitis: Secondary | ICD-10-CM | POA: Diagnosis not present

## 2021-04-30 DIAGNOSIS — J301 Allergic rhinitis due to pollen: Secondary | ICD-10-CM | POA: Diagnosis not present

## 2021-04-30 DIAGNOSIS — J3081 Allergic rhinitis due to animal (cat) (dog) hair and dander: Secondary | ICD-10-CM | POA: Diagnosis not present

## 2021-05-13 DIAGNOSIS — J3081 Allergic rhinitis due to animal (cat) (dog) hair and dander: Secondary | ICD-10-CM | POA: Diagnosis not present

## 2021-05-13 DIAGNOSIS — J301 Allergic rhinitis due to pollen: Secondary | ICD-10-CM | POA: Diagnosis not present

## 2021-05-13 DIAGNOSIS — J3089 Other allergic rhinitis: Secondary | ICD-10-CM | POA: Diagnosis not present

## 2021-05-21 DIAGNOSIS — J3089 Other allergic rhinitis: Secondary | ICD-10-CM | POA: Diagnosis not present

## 2021-05-21 DIAGNOSIS — J3081 Allergic rhinitis due to animal (cat) (dog) hair and dander: Secondary | ICD-10-CM | POA: Diagnosis not present

## 2021-05-21 DIAGNOSIS — K5900 Constipation, unspecified: Secondary | ICD-10-CM | POA: Diagnosis not present

## 2021-05-21 DIAGNOSIS — J301 Allergic rhinitis due to pollen: Secondary | ICD-10-CM | POA: Diagnosis not present

## 2021-06-03 DIAGNOSIS — J301 Allergic rhinitis due to pollen: Secondary | ICD-10-CM | POA: Diagnosis not present

## 2021-06-03 DIAGNOSIS — J3089 Other allergic rhinitis: Secondary | ICD-10-CM | POA: Diagnosis not present

## 2021-06-03 DIAGNOSIS — J3081 Allergic rhinitis due to animal (cat) (dog) hair and dander: Secondary | ICD-10-CM | POA: Diagnosis not present

## 2021-06-06 ENCOUNTER — Inpatient Hospital Stay (HOSPITAL_COMMUNITY)
Admission: EM | Disposition: A | Discharge status: Skilled Nursing Facility | Payer: Self-pay | Source: Home / Self Care | Attending: Thoracic Surgery (Cardiothoracic Vascular Surgery)

## 2021-06-06 ENCOUNTER — Emergency Department (HOSPITAL_COMMUNITY): Payer: Medicare PPO

## 2021-06-06 ENCOUNTER — Other Ambulatory Visit: Payer: Self-pay

## 2021-06-06 ENCOUNTER — Inpatient Hospital Stay (HOSPITAL_COMMUNITY)
Admission: EM | Admit: 2021-06-06 | Discharge: 2021-06-15 | DRG: 233 | Disposition: A | Discharge status: Skilled Nursing Facility | Payer: Medicare PPO | Attending: Thoracic Surgery (Cardiothoracic Vascular Surgery) | Admitting: Thoracic Surgery (Cardiothoracic Vascular Surgery)

## 2021-06-06 DIAGNOSIS — J9811 Atelectasis: Secondary | ICD-10-CM

## 2021-06-06 DIAGNOSIS — D62 Acute posthemorrhagic anemia: Secondary | ICD-10-CM | POA: Diagnosis not present

## 2021-06-06 DIAGNOSIS — J939 Pneumothorax, unspecified: Secondary | ICD-10-CM | POA: Diagnosis not present

## 2021-06-06 DIAGNOSIS — R000 Tachycardia, unspecified: Secondary | ICD-10-CM | POA: Diagnosis not present

## 2021-06-06 DIAGNOSIS — R931 Abnormal findings on diagnostic imaging of heart and coronary circulation: Secondary | ICD-10-CM

## 2021-06-06 DIAGNOSIS — R4182 Altered mental status, unspecified: Secondary | ICD-10-CM | POA: Diagnosis not present

## 2021-06-06 DIAGNOSIS — Z9889 Other specified postprocedural states: Secondary | ICD-10-CM

## 2021-06-06 DIAGNOSIS — R4587 Impulsiveness: Secondary | ICD-10-CM | POA: Diagnosis not present

## 2021-06-06 DIAGNOSIS — Z7982 Long term (current) use of aspirin: Secondary | ICD-10-CM

## 2021-06-06 DIAGNOSIS — I2102 ST elevation (STEMI) myocardial infarction involving left anterior descending coronary artery: Secondary | ICD-10-CM | POA: Diagnosis present

## 2021-06-06 DIAGNOSIS — I5043 Acute on chronic combined systolic (congestive) and diastolic (congestive) heart failure: Secondary | ICD-10-CM | POA: Diagnosis present

## 2021-06-06 DIAGNOSIS — J45998 Other asthma: Secondary | ICD-10-CM | POA: Diagnosis not present

## 2021-06-06 DIAGNOSIS — K219 Gastro-esophageal reflux disease without esophagitis: Secondary | ICD-10-CM | POA: Diagnosis present

## 2021-06-06 DIAGNOSIS — I2109 ST elevation (STEMI) myocardial infarction involving other coronary artery of anterior wall: Secondary | ICD-10-CM | POA: Diagnosis not present

## 2021-06-06 DIAGNOSIS — R5381 Other malaise: Secondary | ICD-10-CM | POA: Diagnosis not present

## 2021-06-06 DIAGNOSIS — Z833 Family history of diabetes mellitus: Secondary | ICD-10-CM

## 2021-06-06 DIAGNOSIS — J811 Chronic pulmonary edema: Secondary | ICD-10-CM | POA: Diagnosis not present

## 2021-06-06 DIAGNOSIS — Z743 Need for continuous supervision: Secondary | ICD-10-CM | POA: Diagnosis not present

## 2021-06-06 DIAGNOSIS — I25118 Atherosclerotic heart disease of native coronary artery with other forms of angina pectoris: Secondary | ICD-10-CM | POA: Diagnosis present

## 2021-06-06 DIAGNOSIS — I255 Ischemic cardiomyopathy: Secondary | ICD-10-CM | POA: Diagnosis present

## 2021-06-06 DIAGNOSIS — E861 Hypovolemia: Secondary | ICD-10-CM | POA: Diagnosis not present

## 2021-06-06 DIAGNOSIS — Z20822 Contact with and (suspected) exposure to covid-19: Secondary | ICD-10-CM | POA: Diagnosis present

## 2021-06-06 DIAGNOSIS — D696 Thrombocytopenia, unspecified: Secondary | ICD-10-CM | POA: Diagnosis not present

## 2021-06-06 DIAGNOSIS — Z4682 Encounter for fitting and adjustment of non-vascular catheter: Secondary | ICD-10-CM | POA: Diagnosis not present

## 2021-06-06 DIAGNOSIS — Z79899 Other long term (current) drug therapy: Secondary | ICD-10-CM

## 2021-06-06 DIAGNOSIS — I213 ST elevation (STEMI) myocardial infarction of unspecified site: Secondary | ICD-10-CM | POA: Diagnosis not present

## 2021-06-06 DIAGNOSIS — R9431 Abnormal electrocardiogram [ECG] [EKG]: Secondary | ICD-10-CM | POA: Diagnosis not present

## 2021-06-06 DIAGNOSIS — J45909 Unspecified asthma, uncomplicated: Secondary | ICD-10-CM | POA: Diagnosis present

## 2021-06-06 DIAGNOSIS — I2510 Atherosclerotic heart disease of native coronary artery without angina pectoris: Secondary | ICD-10-CM

## 2021-06-06 DIAGNOSIS — M545 Low back pain, unspecified: Secondary | ICD-10-CM | POA: Diagnosis not present

## 2021-06-06 DIAGNOSIS — I110 Hypertensive heart disease with heart failure: Secondary | ICD-10-CM | POA: Diagnosis present

## 2021-06-06 DIAGNOSIS — Z09 Encounter for follow-up examination after completed treatment for conditions other than malignant neoplasm: Secondary | ICD-10-CM

## 2021-06-06 DIAGNOSIS — J449 Chronic obstructive pulmonary disease, unspecified: Secondary | ICD-10-CM | POA: Diagnosis present

## 2021-06-06 DIAGNOSIS — R531 Weakness: Secondary | ICD-10-CM | POA: Diagnosis not present

## 2021-06-06 DIAGNOSIS — Z951 Presence of aortocoronary bypass graft: Secondary | ICD-10-CM | POA: Diagnosis not present

## 2021-06-06 DIAGNOSIS — I2101 ST elevation (STEMI) myocardial infarction involving left main coronary artery: Secondary | ICD-10-CM | POA: Diagnosis not present

## 2021-06-06 DIAGNOSIS — Z91040 Latex allergy status: Secondary | ICD-10-CM | POA: Diagnosis not present

## 2021-06-06 DIAGNOSIS — E785 Hyperlipidemia, unspecified: Secondary | ICD-10-CM | POA: Diagnosis present

## 2021-06-06 DIAGNOSIS — E569 Vitamin deficiency, unspecified: Secondary | ICD-10-CM | POA: Diagnosis not present

## 2021-06-06 DIAGNOSIS — Z8249 Family history of ischemic heart disease and other diseases of the circulatory system: Secondary | ICD-10-CM

## 2021-06-06 DIAGNOSIS — I501 Left ventricular failure: Secondary | ICD-10-CM | POA: Diagnosis not present

## 2021-06-06 DIAGNOSIS — H01119 Allergic dermatitis of unspecified eye, unspecified eyelid: Secondary | ICD-10-CM | POA: Diagnosis not present

## 2021-06-06 DIAGNOSIS — J90 Pleural effusion, not elsewhere classified: Secondary | ICD-10-CM | POA: Diagnosis not present

## 2021-06-06 DIAGNOSIS — I088 Other rheumatic multiple valve diseases: Secondary | ICD-10-CM | POA: Diagnosis not present

## 2021-06-06 HISTORY — PX: CORONARY ARTERY BYPASS GRAFT: SHX141

## 2021-06-06 HISTORY — PX: LEFT HEART CATH AND CORONARY ANGIOGRAPHY: CATH118249

## 2021-06-06 HISTORY — PX: IABP INSERTION: CATH118242

## 2021-06-06 HISTORY — PX: ENDOVEIN HARVEST OF GREATER SAPHENOUS VEIN: SHX5059

## 2021-06-06 HISTORY — PX: TEE WITHOUT CARDIOVERSION: SHX5443

## 2021-06-06 LAB — CBC WITH DIFFERENTIAL/PLATELET
Abs Immature Granulocytes: 0.03 10*3/uL (ref 0.00–0.07)
Basophils Absolute: 0.1 10*3/uL (ref 0.0–0.1)
Basophils Relative: 1 %
Eosinophils Absolute: 0.4 10*3/uL (ref 0.0–0.5)
Eosinophils Relative: 4 %
HCT: 47.2 % (ref 39.0–52.0)
Hemoglobin: 15.8 g/dL (ref 13.0–17.0)
Immature Granulocytes: 0 %
Lymphocytes Relative: 19 %
Lymphs Abs: 1.9 10*3/uL (ref 0.7–4.0)
MCH: 32 pg (ref 26.0–34.0)
MCHC: 33.5 g/dL (ref 30.0–36.0)
MCV: 95.5 fL (ref 80.0–100.0)
Monocytes Absolute: 1.2 10*3/uL — ABNORMAL HIGH (ref 0.1–1.0)
Monocytes Relative: 12 %
Neutro Abs: 6.3 10*3/uL (ref 1.7–7.7)
Neutrophils Relative %: 64 %
Platelets: 236 10*3/uL (ref 150–400)
RBC: 4.94 MIL/uL (ref 4.22–5.81)
RDW: 13.9 % (ref 11.5–15.5)
WBC: 9.8 10*3/uL (ref 4.0–10.5)
nRBC: 0 % (ref 0.0–0.2)

## 2021-06-06 LAB — PROTIME-INR
INR: 1 (ref 0.8–1.2)
Prothrombin Time: 13.3 seconds (ref 11.4–15.2)

## 2021-06-06 LAB — LIPID PANEL
Cholesterol: 191 mg/dL (ref 0–200)
HDL: 40 mg/dL — ABNORMAL LOW (ref >40)
LDL Cholesterol: 128 mg/dL — ABNORMAL HIGH (ref 0–99)
Total CHOL/HDL Ratio: 4.8 RATIO
Triglycerides: 117 mg/dL (ref <150)
VLDL: 23 mg/dL (ref 0–40)

## 2021-06-06 LAB — RESP PANEL BY RT-PCR (FLU A&B, COVID) ARPGX2
Influenza A by PCR: NEGATIVE
Influenza B by PCR: NEGATIVE
SARS Coronavirus 2 by RT PCR: NEGATIVE

## 2021-06-06 LAB — APTT: aPTT: 30 seconds (ref 24–36)

## 2021-06-06 SURGERY — LEFT HEART CATH AND CORONARY ANGIOGRAPHY
Anesthesia: LOCAL

## 2021-06-06 SURGERY — CORONARY ARTERY BYPASS GRAFTING (CABG)
Anesthesia: General | Site: Chest | Laterality: Right

## 2021-06-06 MED ORDER — LIDOCAINE HCL (PF) 1 % IJ SOLN
INTRAMUSCULAR | Status: AC
  Filled 2021-06-06: qty 30

## 2021-06-06 MED ORDER — ASPIRIN 81 MG PO CHEW
324.0000 mg | CHEWABLE_TABLET | Freq: Once | ORAL | Status: DC
  Filled 2021-06-06: qty 4

## 2021-06-06 MED ORDER — HEPARIN SODIUM (PORCINE) 1000 UNIT/ML IJ SOLN
INTRAMUSCULAR | Status: DC | PRN
Start: 2021-06-06 — End: 2021-06-07
  Administered 2021-06-06 – 2021-06-07 (×2): 4000 [IU] via INTRAVENOUS

## 2021-06-06 MED ORDER — LIDOCAINE HCL (PF) 1 % IJ SOLN
INTRAMUSCULAR | Status: DC | PRN
  Administered 2021-06-06: 5 mL via SUBCUTANEOUS
  Administered 2021-06-06: 18 mL via SUBCUTANEOUS

## 2021-06-06 MED ORDER — FENTANYL CITRATE (PF) 100 MCG/2ML IJ SOLN
INTRAMUSCULAR | Status: AC
  Filled 2021-06-06: qty 2

## 2021-06-06 MED ORDER — HEPARIN SODIUM (PORCINE) 1000 UNIT/ML IJ SOLN
INTRAMUSCULAR | Status: AC
  Filled 2021-06-06: qty 1

## 2021-06-06 MED ORDER — HEPARIN (PORCINE) IN NACL 1000-0.9 UT/500ML-% IV SOLN
INTRAVENOUS | Status: AC
  Filled 2021-06-06: qty 1000

## 2021-06-06 MED ORDER — HEPARIN (PORCINE) IN NACL 2-0.9 UNITS/ML
INTRAMUSCULAR | Status: DC | PRN
  Administered 2021-06-06: 10 mL via INTRA_ARTERIAL

## 2021-06-06 MED ORDER — NITROGLYCERIN 1 MG/10 ML FOR IR/CATH LAB
INTRA_ARTERIAL | Status: AC
  Filled 2021-06-06: qty 10

## 2021-06-06 MED ORDER — VERAPAMIL HCL 2.5 MG/ML IV SOLN
INTRAVENOUS | Status: AC
  Filled 2021-06-06: qty 2

## 2021-06-06 MED ORDER — MIDAZOLAM HCL 2 MG/2ML IJ SOLN
INTRAMUSCULAR | Status: DC | PRN
Start: 2021-06-06 — End: 2021-06-07
  Administered 2021-06-06: 1 mg via INTRAVENOUS

## 2021-06-06 MED ORDER — HEPARIN (PORCINE) IN NACL 1000-0.9 UT/500ML-% IV SOLN
INTRAVENOUS | Status: DC | PRN
Start: 2021-06-06 — End: 2021-06-07
  Administered 2021-06-06 (×2): 500 mL

## 2021-06-06 MED ORDER — FENTANYL CITRATE (PF) 100 MCG/2ML IJ SOLN
INTRAMUSCULAR | Status: DC | PRN
  Administered 2021-06-06: 25 ug via INTRAVENOUS

## 2021-06-06 MED ORDER — SODIUM CHLORIDE 0.9 % IV SOLN: INTRAVENOUS | Status: DC

## 2021-06-06 MED ORDER — MIDAZOLAM HCL 2 MG/2ML IJ SOLN
INTRAMUSCULAR | Status: AC
  Filled 2021-06-06: qty 2

## 2021-06-06 MED ORDER — HEPARIN SODIUM (PORCINE) 5000 UNIT/ML IJ SOLN
4000.0000 [IU] | Freq: Once | INTRAMUSCULAR | Status: AC
  Administered 2021-06-06: 4000 [IU] via INTRAVENOUS
  Filled 2021-06-06: qty 1

## 2021-06-06 SURGICAL SUPPLY — 84 items
BAG DECANTER FOR FLEXI CONT (MISCELLANEOUS) ×4 IMPLANT
BLADE CLIPPER SURG (BLADE) ×4 IMPLANT
BLADE STERNUM SYSTEM 6 (BLADE) ×4 IMPLANT
BNDG ELASTIC 4X5.8 VLCR STR LF (GAUZE/BANDAGES/DRESSINGS) ×4 IMPLANT
BNDG ELASTIC 6X5.8 VLCR STR LF (GAUZE/BANDAGES/DRESSINGS) ×4 IMPLANT
BNDG GAUZE ELAST 4 BULKY (GAUZE/BANDAGES/DRESSINGS) ×4 IMPLANT
CANISTER SUCT 3000ML PPV (MISCELLANEOUS) ×4 IMPLANT
CANNULA EZ GLIDE AORTIC 21FR (CANNULA) ×4 IMPLANT
CATH CPB KIT HENDRICKSON (MISCELLANEOUS) ×4 IMPLANT
CATH FOLEY LATEX FREE 16FR (CATHETERS) ×4
CATH FOLEY LF 16FR (CATHETERS) ×3 IMPLANT
CATH ROBINSON RED A/P 18FR (CATHETERS) IMPLANT
CATH THORACIC 36FR (CATHETERS) ×4 IMPLANT
CATH THORACIC 36FR RT ANG (CATHETERS) ×4 IMPLANT
CLIP VESOCCLUDE MED 24/CT (CLIP) IMPLANT
CLIP VESOCCLUDE SM WIDE 24/CT (CLIP) ×4 IMPLANT
CONTAINER PROTECT SURGISLUSH (MISCELLANEOUS) ×8 IMPLANT
DERMABOND ADVANCED (GAUZE/BANDAGES/DRESSINGS) ×1
DERMABOND ADVANCED .7 DNX12 (GAUZE/BANDAGES/DRESSINGS) ×3 IMPLANT
DRAPE CARDIOVASCULAR INCISE (DRAPES) ×4
DRAPE SRG 135X102X78XABS (DRAPES) ×3 IMPLANT
DRAPE WARM FLUID 44X44 (DRAPES) ×4 IMPLANT
DRSG COVADERM 4X14 (GAUZE/BANDAGES/DRESSINGS) ×4 IMPLANT
ELECT REM PT RETURN 9FT ADLT (ELECTROSURGICAL) ×8
ELECTRODE REM PT RTRN 9FT ADLT (ELECTROSURGICAL) ×6 IMPLANT
FELT TEFLON 1X6 (MISCELLANEOUS) ×4 IMPLANT
GAUZE SPONGE 4X4 12PLY STRL (GAUZE/BANDAGES/DRESSINGS) ×8 IMPLANT
GLOVE SURG SIGNA 7.5 PF LTX (GLOVE) ×12 IMPLANT
GOWN STRL REUS W/ TWL LRG LVL3 (GOWN DISPOSABLE) ×18 IMPLANT
GOWN STRL REUS W/ TWL XL LVL3 (GOWN DISPOSABLE) ×6 IMPLANT
GOWN STRL REUS W/TWL LRG LVL3 (GOWN DISPOSABLE) ×24
GOWN STRL REUS W/TWL XL LVL3 (GOWN DISPOSABLE) ×8
HEMOSTAT POWDER SURGIFOAM 1G (HEMOSTASIS) ×12 IMPLANT
HEMOSTAT SURGICEL 2X14 (HEMOSTASIS) ×4 IMPLANT
INSERT FOGARTY XLG (MISCELLANEOUS) IMPLANT
KIT BASIN OR (CUSTOM PROCEDURE TRAY) ×4 IMPLANT
KIT SUCTION CATH 14FR (SUCTIONS) ×12 IMPLANT
KIT TURNOVER KIT B (KITS) ×4 IMPLANT
KIT VASOVIEW HEMOPRO 2 VH 4000 (KITS) ×4 IMPLANT
MARKER GRAFT CORONARY BYPASS (MISCELLANEOUS) ×12 IMPLANT
NS IRRIG 1000ML POUR BTL (IV SOLUTION) ×20 IMPLANT
PACK E OPEN HEART (SUTURE) ×4 IMPLANT
PACK OPEN HEART (CUSTOM PROCEDURE TRAY) ×4 IMPLANT
PAD ARMBOARD 7.5X6 YLW CONV (MISCELLANEOUS) ×8 IMPLANT
PAD ELECT DEFIB RADIOL ZOLL (MISCELLANEOUS) ×4 IMPLANT
PENCIL BUTTON HOLSTER BLD 10FT (ELECTRODE) ×4 IMPLANT
POSITIONER HEAD DONUT 9IN (MISCELLANEOUS) ×4 IMPLANT
PUNCH AORTIC ROTATE 4.0MM (MISCELLANEOUS) IMPLANT
PUNCH AORTIC ROTATE 4.5MM 8IN (MISCELLANEOUS) ×4 IMPLANT
PUNCH AORTIC ROTATE 5MM 8IN (MISCELLANEOUS) IMPLANT
SET MPS 3-ND DEL (MISCELLANEOUS) ×4 IMPLANT
SUPPORT HEART JANKE-BARRON (MISCELLANEOUS) ×4 IMPLANT
SUT BONE WAX W31G (SUTURE) ×4 IMPLANT
SUT ETHIBOND 2 0 SH (SUTURE) ×4
SUT ETHIBOND 2 0 SH 36X2 (SUTURE) ×3 IMPLANT
SUT MNCRL AB 4-0 PS2 18 (SUTURE) IMPLANT
SUT PROLENE 3 0 SH DA (SUTURE) ×4 IMPLANT
SUT PROLENE 4 0 RB 1 (SUTURE) ×16
SUT PROLENE 4 0 SH DA (SUTURE) IMPLANT
SUT PROLENE 4-0 RB1 .5 CRCL 36 (SUTURE) ×12 IMPLANT
SUT PROLENE 6 0 C 1 30 (SUTURE) ×16 IMPLANT
SUT PROLENE 7 0 BV1 MDA (SUTURE) ×8 IMPLANT
SUT PROLENE 8 0 BV175 6 (SUTURE) IMPLANT
SUT STEEL 6MS V (SUTURE) ×4 IMPLANT
SUT STEEL STERNAL CCS#1 18IN (SUTURE) IMPLANT
SUT STEEL SZ 6 DBL 3X14 BALL (SUTURE) ×4 IMPLANT
SUT VIC AB 1 CTX 36 (SUTURE) ×8
SUT VIC AB 1 CTX36XBRD ANBCTR (SUTURE) ×6 IMPLANT
SUT VIC AB 2-0 CT1 27 (SUTURE) ×4
SUT VIC AB 2-0 CT1 TAPERPNT 27 (SUTURE) ×3 IMPLANT
SUT VIC AB 2-0 CTX 27 (SUTURE) IMPLANT
SUT VIC AB 3-0 SH 27 (SUTURE)
SUT VIC AB 3-0 SH 27X BRD (SUTURE) IMPLANT
SUT VIC AB 3-0 X1 27 (SUTURE) IMPLANT
SUT VICRYL 3 0 CT 3 (SUTURE) ×4 IMPLANT
SUT VICRYL 4-0 PS2 18IN ABS (SUTURE) IMPLANT
SYSTEM SAHARA CHEST DRAIN ATS (WOUND CARE) ×4 IMPLANT
TAPE CLOTH SURG 4X10 WHT LF (GAUZE/BANDAGES/DRESSINGS) ×4 IMPLANT
TAPE PAPER 2X10 WHT MICROPORE (GAUZE/BANDAGES/DRESSINGS) ×4 IMPLANT
TOWEL GREEN STERILE (TOWEL DISPOSABLE) ×4 IMPLANT
TOWEL GREEN STERILE FF (TOWEL DISPOSABLE) ×4 IMPLANT
TUBING LAP HI FLOW INSUFFLATIO (TUBING) ×4 IMPLANT
UNDERPAD 30X36 HEAVY ABSORB (UNDERPADS AND DIAPERS) ×4 IMPLANT
WATER STERILE IRR 1000ML POUR (IV SOLUTION) ×8 IMPLANT

## 2021-06-06 SURGICAL SUPPLY — 13 items
BALLN IABP SENSA PLUS 8F 50CC (BALLOONS) ×2
BALLOON IABP SENS PLUS 8F 50CC (BALLOONS) ×1 IMPLANT
CATH INFINITI JR4 5F (CATHETERS) ×2 IMPLANT
CATH OPTITORQUE TIG 4.0 5F (CATHETERS) ×2 IMPLANT
GLIDESHEATH SLEND SS 6F .021 (SHEATH) ×2 IMPLANT
GUIDEWIRE INQWIRE 1.5J.035X260 (WIRE) ×1 IMPLANT
INQWIRE 1.5J .035X260CM (WIRE) ×2
KIT HEART LEFT (KITS) ×2 IMPLANT
PACK CARDIAC CATHETERIZATION (CUSTOM PROCEDURE TRAY) ×2 IMPLANT
SHEATH PINNACLE 6F 10CM (SHEATH) ×2 IMPLANT
SHEATH PROBE COVER 6X72 (BAG) ×2 IMPLANT
TRANSDUCER W/STOPCOCK (MISCELLANEOUS) ×2 IMPLANT
TUBING CIL FLEX 10 FLL-RA (TUBING) ×2 IMPLANT

## 2021-06-06 NOTE — ED Notes (Signed)
Cards at bedside

## 2021-06-06 NOTE — ED Provider Notes (Addendum)
[facility] DEPART[facility] Provider Note   CSN: [id number] Arrival date & time: 06/06/21  2246     History Chief Complaint  Patient presents with   Code STEMI    Chad Washington is a 80 y.o. male with a hx of asthma & GERD who presents to the ED via EMS as CODE STEMI with complaints of chest pain that began approximately 1 hour PTA. Patient states he was not doing anything particularly strenuous with onset of chest pain- central, radiates to the LUE, feels like an ache, alleviated some with nitroglycerin given by EMS. Current pain is a 5/10 in severity. Per EMS gave 325 mg of ASA & 1 nitroglycerin, however they suspect this dropped his BP, initially 110/50, S/p nitro could not palpate radial pulse or get an accurate BP reading, improved with fluids. Patient had similar episode @ 10AM, EMS was called, reassuring EKG at that time, and was not transported- spontaneously resolved. Patient denies nausea, vomiting, diaphoresis, dyspnea, or syncope.   HPI     Past Medical History:  Diagnosis Date   Asthma    allergy induced asthma   GERD (gastroesophageal reflux disease)     There are no problems to display for this patient.   Past Surgical History:  Procedure Laterality Date   COLONOSCOPY WITH PROPOFOL N/A 06/21/2017   Procedure: COLONOSCOPY WITH PROPOFOL;  Surgeon: [name], MD;  Location: [facility] ENDOSCOPY;  Service: Endoscopy;  Laterality: N/A;   EYE SURGERY Bilateral 6 yrs ago   ioc lens implants   HERNIA REPAIR     inguinal hernia left 8- 10 yrs ago, right inguinal hernia age 50       Family History  Problem Relation Age of Onset   Hypertension Mother    Cancer Father    Diabetes Father     Social History   Tobacco Use   Smoking status: Never   Smokeless tobacco: Never  Vaping Use   Vaping Use: Never used  Substance Use Topics   Alcohol use: Yes    Alcohol/week: 0.0 standard drinks    Comment: occasionally   Drug use: No     Home Medications Prior to Admission medications   Medication Sig Start Date End Date Taking? Authorizing Provider  aspirin EC 81 MG tablet Take 81 mg by mouth. On Tuesday, Thursday, and Saturday    [provider]  azelastine (OPTIVAR) 0.05 % ophthalmic solution Place 1 drop into both eyes 2 (two) times daily.    [provider]  cetirizine (ZYRTEC) 10 MG tablet Take 10 mg by mouth daily at 6 PM.    [provider]  dexlansoprazole (DEXILANT) 60 MG capsule Take 60 mg by mouth daily.    [provider]  EPINEPHrine (EPIPEN 2-PAK) 0.3 mg/0.3 mL IJ SOAJ injection Inject 0.3 mg into the muscle as needed (allergic reaction).    [provider]  fluticasone (FLONASE) 50 MCG/ACT nasal spray Place 1 spray into both nostrils at bedtime.     [provider]  fluticasone (FLOVENT HFA) 110 MCG/ACT inhaler Inhale 1 puff into the lungs 2 (two) times daily.    [provider]  Ibuprofen-Diphenhydramine HCl (ADVIL PM) 200-25 MG CAPS Take 1 tablet by mouth at bedtime as needed (sleep).    [provider]  montelukast (SINGULAIR) 10 MG tablet Take 10 mg by mouth daily at 6 PM.    [provider]  Multiple Vitamins-Minerals (CENTRUM SILVER PO) Take 1 tablet by mouth  daily.    [provider]    Allergies    Latex  Review of Systems   Review of Systems  Constitutional:  Negative for chills, diaphoresis and fever.  Respiratory:  Negative for shortness of breath.   Cardiovascular:  Positive for chest pain. Negative for leg swelling.  Gastrointestinal:  Negative for abdominal pain, nausea and vomiting.  Neurological:  Negative for dizziness and syncope.  All other systems reviewed and are negative.  Physical Exam Updated Vital Signs Pulse 70   Resp 16   Ht 6' (1.829 m)   Wt 77.1 kg   BMI 23.06 kg/m   Physical Exam Vitals and nursing note reviewed.  Constitutional:      Appearance: He is well-developed. He  is not toxic-appearing.  HENT:     Head: Normocephalic and atraumatic.  Eyes:     General:        Right eye: No discharge.        Left eye: No discharge.     Conjunctiva/sclera: Conjunctivae normal.  Cardiovascular:     Rate and Rhythm: Normal rate and regular rhythm.  Pulmonary:     Effort: Pulmonary effort is normal. No respiratory distress.     Breath sounds: Normal breath sounds. No wheezing, rhonchi or rales.  Abdominal:     General: There is no distension.     Palpations: Abdomen is soft.     Tenderness: There is no abdominal tenderness.  Musculoskeletal:        General: No tenderness.     Cervical back: Neck supple.     Right lower leg: No edema.     Left lower leg: No edema.  Skin:    General: Skin is warm.     Findings: No rash.  Neurological:     Mental Status: He is alert.     Comments: Clear speech.   Psychiatric:        Behavior: Behavior normal.    ED Results / Procedures / Treatments   Labs (all labs ordered are listed, but only abnormal results are displayed) Labs Reviewed  RESP PANEL BY RT-PCR (FLU A&B, COVID) ARPGX2  HEMOGLOBIN A1C  CBC WITH DIFFERENTIAL/PLATELET  PROTIME-INR  APTT  COMPREHENSIVE METABOLIC PANEL  LIPID PANEL  TROPONIN I (HIGH SENSITIVITY)    EKG EKG Interpretation  Date/Time:  Saturday June 06 2021 22:51:43 EDT Ventricular Rate:  73 PR Interval:  145 QRS Duration: 92 QT Interval:  420 QTC Calculation: 463 R Axis:   73 Text Interpretation: ** ** ACUTE MI / STEMI ** ** Confirmed by Isla Pence [phone]) on 06/06/2021 11:12:01 PM  Radiology No results found.  Procedures .Critical Care  Date/Time: 06/06/2021 11:15 PM Performed by: Amaryllis Dyke, PA-C Authorized by: Amaryllis Dyke, PA-C   CRITICAL CARE Performed by: Kennith Maes   Total critical care time: 20 minutes  Critical care time was exclusive of separately billable procedures and treating other patients.  Critical care was  necessary to treat or prevent imminent or life-threatening deterioration.  Critical care was time spent personally by me on the following activities: development of treatment plan with patient and/or surrogate as well as nursing, discussions with consultants, evaluation of patient's response to treatment, examination of patient, obtaining history from patient or surrogate, ordering and performing treatments and interventions, ordering and review of laboratory studies, ordering and review of radiographic studies, pulse oximetry and re-evaluation of patient's condition.  Medications Ordered in ED Medications  0.9 %  sodium chloride infusion (has  no administration in time range)  aspirin chewable tablet 324 mg (has no administration in time range)  heparin injection 4,000 Units (has no administration in time range)    ED Course  I have reviewed the triage vital signs and the nursing notes.  Pertinent labs & imaging results that were available during my care of the patient were reviewed by me and considered in my medical decision making (see chart for details).    MDM Rules/Calculators/A&P                          Patient presents to the ED with complaints of chest pain. Current pain 5/10 in severity, has had aspirin en route, & received nitroglycerin with concern for decrease in blood pressure improved with fluids per EMS.  Patient is nontoxic, vitals WNL on arrival.   EKG on ED arrival consistent with acute MI- STEMI  Dr. Humphrey Rolls with cardiology @ bedside on arrival, interventionalist Dr. Claiborne Billings en route to the hospital- plan to go to cath lab, give 4,000 units of heparin.   Additional history obtained:  Additional history obtained from chart review & EMS - no prior cardiac catheterization or stress test on record.   Lab Tests:  Ordered & pending.   ED Course:  Patient transported to cath lab  Findings and plan of care discussed with supervising physician Dr. Gilford Raid who has evaluated  patient as shared visit & is in agreement.   Portions of this note were generated with [employer]. Dictation errors may occur despite best attempts at proofreading.  Final Clinical Impression(s) / ED Diagnoses Final diagnoses:  ST elevation myocardial infarction involving left anterior descending (LAD) coronary artery [facility])    Rx / DC Orders ED Discharge Orders     None        Leafy Kindle 06/06/21 2318    Isla Pence, MD 06/06/21 [address], [place] R, PA-C 06/06/21 2326    Isla Pence, MD 06/07/21 [phone]

## 2021-06-06 NOTE — ED Notes (Signed)
Pt reports began at 7/10, decreased to 5/10 after 1NTG.

## 2021-06-06 NOTE — ED Triage Notes (Signed)
Pt BIB [facility] from home, c/o sudden onset chest pain that started approx. 1 hour pta. Pt had similar episode this morning at 10am that resolved. Given 324mg  asa, 1 NTG given by EMS.

## 2021-06-07 ENCOUNTER — Inpatient Hospital Stay (HOSPITAL_COMMUNITY): Payer: Medicare PPO

## 2021-06-07 ENCOUNTER — Inpatient Hospital Stay (HOSPITAL_COMMUNITY): Payer: Medicare PPO | Admitting: Anesthesiology

## 2021-06-07 ENCOUNTER — Encounter (HOSPITAL_COMMUNITY): Payer: Self-pay | Admitting: Thoracic Surgery (Cardiothoracic Vascular Surgery)

## 2021-06-07 DIAGNOSIS — E861 Hypovolemia: Secondary | ICD-10-CM | POA: Diagnosis not present

## 2021-06-07 DIAGNOSIS — R4182 Altered mental status, unspecified: Secondary | ICD-10-CM | POA: Diagnosis not present

## 2021-06-07 DIAGNOSIS — Z951 Presence of aortocoronary bypass graft: Secondary | ICD-10-CM | POA: Diagnosis not present

## 2021-06-07 DIAGNOSIS — D62 Acute posthemorrhagic anemia: Secondary | ICD-10-CM | POA: Diagnosis not present

## 2021-06-07 DIAGNOSIS — I2510 Atherosclerotic heart disease of native coronary artery without angina pectoris: Secondary | ICD-10-CM | POA: Diagnosis not present

## 2021-06-07 DIAGNOSIS — I2109 ST elevation (STEMI) myocardial infarction involving other coronary artery of anterior wall: Secondary | ICD-10-CM

## 2021-06-07 DIAGNOSIS — M545 Low back pain, unspecified: Secondary | ICD-10-CM | POA: Diagnosis not present

## 2021-06-07 DIAGNOSIS — I2102 ST elevation (STEMI) myocardial infarction involving left anterior descending coronary artery: Secondary | ICD-10-CM | POA: Diagnosis present

## 2021-06-07 DIAGNOSIS — I5043 Acute on chronic combined systolic (congestive) and diastolic (congestive) heart failure: Secondary | ICD-10-CM | POA: Diagnosis present

## 2021-06-07 DIAGNOSIS — Z91040 Latex allergy status: Secondary | ICD-10-CM | POA: Diagnosis not present

## 2021-06-07 DIAGNOSIS — E785 Hyperlipidemia, unspecified: Secondary | ICD-10-CM | POA: Diagnosis present

## 2021-06-07 DIAGNOSIS — Z20822 Contact with and (suspected) exposure to covid-19: Secondary | ICD-10-CM | POA: Diagnosis present

## 2021-06-07 DIAGNOSIS — I501 Left ventricular failure: Secondary | ICD-10-CM | POA: Diagnosis not present

## 2021-06-07 DIAGNOSIS — Z8249 Family history of ischemic heart disease and other diseases of the circulatory system: Secondary | ICD-10-CM | POA: Diagnosis not present

## 2021-06-07 DIAGNOSIS — I110 Hypertensive heart disease with heart failure: Secondary | ICD-10-CM | POA: Diagnosis present

## 2021-06-07 DIAGNOSIS — R9431 Abnormal electrocardiogram [ECG] [EKG]: Secondary | ICD-10-CM | POA: Diagnosis not present

## 2021-06-07 DIAGNOSIS — D696 Thrombocytopenia, unspecified: Secondary | ICD-10-CM | POA: Diagnosis not present

## 2021-06-07 DIAGNOSIS — Z9889 Other specified postprocedural states: Secondary | ICD-10-CM

## 2021-06-07 DIAGNOSIS — J45909 Unspecified asthma, uncomplicated: Secondary | ICD-10-CM | POA: Diagnosis present

## 2021-06-07 DIAGNOSIS — I255 Ischemic cardiomyopathy: Secondary | ICD-10-CM | POA: Diagnosis present

## 2021-06-07 DIAGNOSIS — K219 Gastro-esophageal reflux disease without esophagitis: Secondary | ICD-10-CM | POA: Diagnosis present

## 2021-06-07 DIAGNOSIS — R000 Tachycardia, unspecified: Secondary | ICD-10-CM | POA: Diagnosis not present

## 2021-06-07 DIAGNOSIS — J9811 Atelectasis: Secondary | ICD-10-CM | POA: Diagnosis not present

## 2021-06-07 DIAGNOSIS — J449 Chronic obstructive pulmonary disease, unspecified: Secondary | ICD-10-CM | POA: Diagnosis present

## 2021-06-07 DIAGNOSIS — R4587 Impulsiveness: Secondary | ICD-10-CM | POA: Diagnosis not present

## 2021-06-07 DIAGNOSIS — Z833 Family history of diabetes mellitus: Secondary | ICD-10-CM | POA: Diagnosis not present

## 2021-06-07 DIAGNOSIS — Z7982 Long term (current) use of aspirin: Secondary | ICD-10-CM | POA: Diagnosis not present

## 2021-06-07 DIAGNOSIS — I25118 Atherosclerotic heart disease of native coronary artery with other forms of angina pectoris: Secondary | ICD-10-CM | POA: Diagnosis present

## 2021-06-07 DIAGNOSIS — Z79899 Other long term (current) drug therapy: Secondary | ICD-10-CM | POA: Diagnosis not present

## 2021-06-07 LAB — CBC
HCT: 32.6 % — ABNORMAL LOW (ref 39.0–52.0)
HCT: 35.3 % — ABNORMAL LOW (ref 39.0–52.0)
HCT: 44.8 % (ref 39.0–52.0)
Hemoglobin: 11 g/dL — ABNORMAL LOW (ref 13.0–17.0)
Hemoglobin: 11.8 g/dL — ABNORMAL LOW (ref 13.0–17.0)
Hemoglobin: 15.2 g/dL (ref 13.0–17.0)
MCH: 31.8 pg (ref 26.0–34.0)
MCH: 32.2 pg (ref 26.0–34.0)
MCH: 32.8 pg (ref 26.0–34.0)
MCHC: 33.4 g/dL (ref 30.0–36.0)
MCHC: 33.7 g/dL (ref 30.0–36.0)
MCHC: 33.9 g/dL (ref 30.0–36.0)
MCV: 93.7 fL (ref 80.0–100.0)
MCV: 96.2 fL (ref 80.0–100.0)
MCV: 97.3 fL (ref 80.0–100.0)
Platelets: 111 10*3/uL — ABNORMAL LOW (ref 150–400)
Platelets: 136 10*3/uL — ABNORMAL LOW (ref 150–400)
Platelets: 216 10*3/uL (ref 150–400)
RBC: 3.35 MIL/uL — ABNORMAL LOW (ref 4.22–5.81)
RBC: 3.67 MIL/uL — ABNORMAL LOW (ref 4.22–5.81)
RBC: 4.78 MIL/uL (ref 4.22–5.81)
RDW: 13.8 % (ref 11.5–15.5)
RDW: 14 % (ref 11.5–15.5)
RDW: 14.3 % (ref 11.5–15.5)
WBC: 10.3 10*3/uL (ref 4.0–10.5)
WBC: 16 10*3/uL — ABNORMAL HIGH (ref 4.0–10.5)
WBC: 8.1 10*3/uL (ref 4.0–10.5)
nRBC: 0 % (ref 0.0–0.2)
nRBC: 0 % (ref 0.0–0.2)
nRBC: 0 % (ref 0.0–0.2)

## 2021-06-07 LAB — PREPARE FRESH FROZEN PLASMA
Unit division: 0
Unit division: 0

## 2021-06-07 LAB — BPAM FFP
Blood Product Expiration Date: 202206172359
Blood Product Expiration Date: 202206172359
Blood Product Expiration Date: 202206172359
Blood Product Expiration Date: 202206172359
ISSUE DATE / TIME: 202206120123
ISSUE DATE / TIME: 202206120123
ISSUE DATE / TIME: 202206120123
ISSUE DATE / TIME: 202206120123
Unit Type and Rh: 5100
Unit Type and Rh: 5100
Unit Type and Rh: 9500
Unit Type and Rh: 9500

## 2021-06-07 LAB — POCT I-STAT, CHEM 8
BUN: 13 mg/dL (ref 8–23)
BUN: 13 mg/dL (ref 8–23)
BUN: 14 mg/dL (ref 8–23)
BUN: 16 mg/dL (ref 8–23)
BUN: 17 mg/dL (ref 8–23)
Calcium, Ion: 0.98 mmol/L — ABNORMAL LOW (ref 1.15–1.40)
Calcium, Ion: 1.02 mmol/L — ABNORMAL LOW (ref 1.15–1.40)
Calcium, Ion: 1.06 mmol/L — ABNORMAL LOW (ref 1.15–1.40)
Calcium, Ion: 1.15 mmol/L (ref 1.15–1.40)
Calcium, Ion: 1.17 mmol/L (ref 1.15–1.40)
Chloride: 104 mmol/L (ref 98–111)
Chloride: 104 mmol/L (ref 98–111)
Chloride: 104 mmol/L (ref 98–111)
Chloride: 105 mmol/L (ref 98–111)
Chloride: 105 mmol/L (ref 98–111)
Creatinine, Ser: 0.6 mg/dL — ABNORMAL LOW (ref 0.61–1.24)
Creatinine, Ser: 0.6 mg/dL — ABNORMAL LOW (ref 0.61–1.24)
Creatinine, Ser: 0.6 mg/dL — ABNORMAL LOW (ref 0.61–1.24)
Creatinine, Ser: 0.7 mg/dL (ref 0.61–1.24)
Creatinine, Ser: 0.7 mg/dL (ref 0.61–1.24)
Glucose, Bld: 100 mg/dL — ABNORMAL HIGH (ref 70–99)
Glucose, Bld: 112 mg/dL — ABNORMAL HIGH (ref 70–99)
Glucose, Bld: 114 mg/dL — ABNORMAL HIGH (ref 70–99)
Glucose, Bld: 140 mg/dL — ABNORMAL HIGH (ref 70–99)
Glucose, Bld: 147 mg/dL — ABNORMAL HIGH (ref 70–99)
HCT: 28 % — ABNORMAL LOW (ref 39.0–52.0)
HCT: 29 % — ABNORMAL LOW (ref 39.0–52.0)
HCT: 31 % — ABNORMAL LOW (ref 39.0–52.0)
HCT: 39 % (ref 39.0–52.0)
HCT: 44 % (ref 39.0–52.0)
Hemoglobin: 10.5 g/dL — ABNORMAL LOW (ref 13.0–17.0)
Hemoglobin: 13.3 g/dL (ref 13.0–17.0)
Hemoglobin: 15 g/dL (ref 13.0–17.0)
Hemoglobin: 9.5 g/dL — ABNORMAL LOW (ref 13.0–17.0)
Hemoglobin: 9.9 g/dL — ABNORMAL LOW (ref 13.0–17.0)
Potassium: 3.9 mmol/L (ref 3.5–5.1)
Potassium: 3.9 mmol/L (ref 3.5–5.1)
Potassium: 4 mmol/L (ref 3.5–5.1)
Potassium: 5.2 mmol/L — ABNORMAL HIGH (ref 3.5–5.1)
Potassium: 5.4 mmol/L — ABNORMAL HIGH (ref 3.5–5.1)
Sodium: 137 mmol/L (ref 135–145)
Sodium: 139 mmol/L (ref 135–145)
Sodium: 141 mmol/L (ref 135–145)
Sodium: 141 mmol/L (ref 135–145)
Sodium: 141 mmol/L (ref 135–145)
TCO2: 23 mmol/L (ref 22–32)
TCO2: 25 mmol/L (ref 22–32)
TCO2: 26 mmol/L (ref 22–32)
TCO2: 26 mmol/L (ref 22–32)
TCO2: 27 mmol/L (ref 22–32)

## 2021-06-07 LAB — GLUCOSE, CAPILLARY
Glucose-Capillary: 125 mg/dL — ABNORMAL HIGH (ref 70–99)
Glucose-Capillary: 124 mg/dL — ABNORMAL HIGH (ref 70–99)
Glucose-Capillary: 122 mg/dL — ABNORMAL HIGH (ref 70–99)
Glucose-Capillary: 117 mg/dL — ABNORMAL HIGH (ref 70–99)
Glucose-Capillary: 128 mg/dL — ABNORMAL HIGH (ref 70–99)
Glucose-Capillary: 128 mg/dL — ABNORMAL HIGH (ref 70–99)
Glucose-Capillary: 108 mg/dL — ABNORMAL HIGH (ref 70–99)
Glucose-Capillary: 115 mg/dL — ABNORMAL HIGH (ref 70–99)
Glucose-Capillary: 139 mg/dL — ABNORMAL HIGH (ref 70–99)
Glucose-Capillary: 119 mg/dL — ABNORMAL HIGH (ref 70–99)
Glucose-Capillary: 135 mg/dL — ABNORMAL HIGH (ref 70–99)
Glucose-Capillary: 137 mg/dL — ABNORMAL HIGH (ref 70–99)
Glucose-Capillary: 140 mg/dL — ABNORMAL HIGH (ref 70–99)
Glucose-Capillary: 130 mg/dL — ABNORMAL HIGH (ref 70–99)
Glucose-Capillary: 134 mg/dL — ABNORMAL HIGH (ref 70–99)
Glucose-Capillary: 137 mg/dL — ABNORMAL HIGH (ref 70–99)
Glucose-Capillary: 147 mg/dL — ABNORMAL HIGH (ref 70–99)

## 2021-06-07 LAB — POCT I-STAT 7, (LYTES, BLD GAS, ICA,H+H)
Acid-Base Excess: 0 mmol/L (ref 0.0–2.0)
Acid-Base Excess: 2 mmol/L (ref 0.0–2.0)
Acid-Base Excess: 1 mmol/L (ref 0.0–2.0)
Acid-base deficit: 2 mmol/L (ref 0.0–2.0)
Acid-base deficit: 2 mmol/L (ref 0.0–2.0)
Acid-base deficit: 4 mmol/L — ABNORMAL HIGH (ref 0.0–2.0)
Bicarbonate: 27 mmol/L (ref 20.0–28.0)
Bicarbonate: 27.9 mmol/L (ref 20.0–28.0)
Bicarbonate: 25.2 mmol/L (ref 20.0–28.0)
Bicarbonate: 21.8 mmol/L (ref 20.0–28.0)
Bicarbonate: 24.3 mmol/L (ref 20.0–28.0)
Bicarbonate: 22.6 mmol/L (ref 20.0–28.0)
Calcium, Ion: 1.22 mmol/L (ref 1.15–1.40)
Calcium, Ion: 0.95 mmol/L — ABNORMAL LOW (ref 1.15–1.40)
Calcium, Ion: 1.02 mmol/L — ABNORMAL LOW (ref 1.15–1.40)
Calcium, Ion: 1.06 mmol/L — ABNORMAL LOW (ref 1.15–1.40)
Calcium, Ion: 1.21 mmol/L (ref 1.15–1.40)
Calcium, Ion: 1.22 mmol/L (ref 1.15–1.40)
HCT: 38 % — ABNORMAL LOW (ref 39.0–52.0)
HCT: 29 % — ABNORMAL LOW (ref 39.0–52.0)
HCT: 30 % — ABNORMAL LOW (ref 39.0–52.0)
HCT: 33 % — ABNORMAL LOW (ref 39.0–52.0)
HCT: 31 % — ABNORMAL LOW (ref 39.0–52.0)
HCT: 32 % — ABNORMAL LOW (ref 39.0–52.0)
Hemoglobin: 12.9 g/dL — ABNORMAL LOW (ref 13.0–17.0)
Hemoglobin: 9.9 g/dL — ABNORMAL LOW (ref 13.0–17.0)
Hemoglobin: 10.2 g/dL — ABNORMAL LOW (ref 13.0–17.0)
Hemoglobin: 11.2 g/dL — ABNORMAL LOW (ref 13.0–17.0)
Hemoglobin: 10.5 g/dL — ABNORMAL LOW (ref 13.0–17.0)
Hemoglobin: 10.9 g/dL — ABNORMAL LOW (ref 13.0–17.0)
O2 Saturation: 100 %
O2 Saturation: 100 %
O2 Saturation: 100 %
O2 Saturation: 99 %
O2 Saturation: 95 %
O2 Saturation: 94 %
Patient temperature: 34.4
Patient temperature: 37.7
Potassium: 3.8 mmol/L (ref 3.5–5.1)
Potassium: 4.7 mmol/L (ref 3.5–5.1)
Potassium: 3.9 mmol/L (ref 3.5–5.1)
Potassium: 3.8 mmol/L (ref 3.5–5.1)
Potassium: 4.6 mmol/L (ref 3.5–5.1)
Potassium: 4.8 mmol/L (ref 3.5–5.1)
Sodium: 141 mmol/L (ref 135–145)
Sodium: 142 mmol/L (ref 135–145)
Sodium: 142 mmol/L (ref 135–145)
Sodium: 142 mmol/L (ref 135–145)
Sodium: 143 mmol/L (ref 135–145)
Sodium: 143 mmol/L (ref 135–145)
TCO2: 29 mmol/L (ref 22–32)
TCO2: 29 mmol/L (ref 22–32)
TCO2: 26 mmol/L (ref 22–32)
TCO2: 23 mmol/L (ref 22–32)
TCO2: 26 mmol/L (ref 22–32)
TCO2: 24 mmol/L (ref 22–32)
pCO2 arterial: 51.4 mmHg — ABNORMAL HIGH (ref 32.0–48.0)
pCO2 arterial: 50 mmHg — ABNORMAL HIGH (ref 32.0–48.0)
pCO2 arterial: 38.7 mmHg (ref 32.0–48.0)
pCO2 arterial: 30.9 mmHg — ABNORMAL LOW (ref 32.0–48.0)
pCO2 arterial: 47.5 mmHg (ref 32.0–48.0)
pCO2 arterial: 47.2 mmHg (ref 32.0–48.0)
pH, Arterial: 7.329 — ABNORMAL LOW (ref 7.350–7.450)
pH, Arterial: 7.354 (ref 7.350–7.450)
pH, Arterial: 7.422 (ref 7.350–7.450)
pH, Arterial: 7.446 (ref 7.350–7.450)
pH, Arterial: 7.316 — ABNORMAL LOW (ref 7.350–7.450)
pH, Arterial: 7.292 — ABNORMAL LOW (ref 7.350–7.450)
pO2, Arterial: 225 mmHg — ABNORMAL HIGH (ref 83.0–108.0)
pO2, Arterial: 401 mmHg — ABNORMAL HIGH (ref 83.0–108.0)
pO2, Arterial: 302 mmHg — ABNORMAL HIGH (ref 83.0–108.0)
pO2, Arterial: 106 mmHg (ref 83.0–108.0)
pO2, Arterial: 85 mmHg (ref 83.0–108.0)
pO2, Arterial: 82 mmHg — ABNORMAL LOW (ref 83.0–108.0)

## 2021-06-07 LAB — POCT I-STAT EG7
Acid-Base Excess: 1 mmol/L (ref 0.0–2.0)
Bicarbonate: 26.8 mmol/L (ref 20.0–28.0)
Calcium, Ion: 1.03 mmol/L — ABNORMAL LOW (ref 1.15–1.40)
HCT: 31 % — ABNORMAL LOW (ref 39.0–52.0)
Hemoglobin: 10.5 g/dL — ABNORMAL LOW (ref 13.0–17.0)
O2 Saturation: 80 %
Potassium: 4.4 mmol/L (ref 3.5–5.1)
Sodium: 142 mmol/L (ref 135–145)
TCO2: 28 mmol/L (ref 22–32)
pCO2, Ven: 49.4 mmHg (ref 44.0–60.0)
pH, Ven: 7.342 (ref 7.250–7.430)
pO2, Ven: 47 mmHg — ABNORMAL HIGH (ref 32.0–45.0)

## 2021-06-07 LAB — COOXEMETRY PANEL
Carboxyhemoglobin: 1.1 % (ref 0.5–1.5)
Methemoglobin: 1 % (ref 0.0–1.5)
O2 Saturation: 71.3 %
Total hemoglobin: 10.8 g/dL — ABNORMAL LOW (ref 12.0–16.0)

## 2021-06-07 LAB — BASIC METABOLIC PANEL
Anion gap: 5 (ref 5–15)
BUN: 12 mg/dL (ref 8–23)
CO2: 23 mmol/L (ref 22–32)
Calcium: 8 mg/dL — ABNORMAL LOW (ref 8.9–10.3)
Chloride: 112 mmol/L — ABNORMAL HIGH (ref 98–111)
Creatinine, Ser: 0.78 mg/dL (ref 0.61–1.24)
GFR, Estimated: >60 mL/min (ref >60)
Glucose, Bld: 114 mg/dL — ABNORMAL HIGH (ref 70–99)
Potassium: 4.4 mmol/L (ref 3.5–5.1)
Sodium: 140 mmol/L (ref 135–145)

## 2021-06-07 LAB — TROPONIN I (HIGH SENSITIVITY)
Troponin I (High Sensitivity): 233 ng/L (ref <18)
Troponin I (High Sensitivity): 785 ng/L (ref <18)

## 2021-06-07 LAB — COMPREHENSIVE METABOLIC PANEL
ALT: 32 U/L (ref 0–44)
ALT: 29 U/L (ref 0–44)
AST: 39 U/L (ref 15–41)
AST: 38 U/L (ref 15–41)
Albumin: 4 g/dL (ref 3.5–5.0)
Albumin: 3.5 g/dL (ref 3.5–5.0)
Alkaline Phosphatase: 39 U/L (ref 38–126)
Alkaline Phosphatase: 39 U/L (ref 38–126)
Anion gap: 7 (ref 5–15)
Anion gap: 9 (ref 5–15)
BUN: 16 mg/dL (ref 8–23)
BUN: 13 mg/dL (ref 8–23)
CO2: 26 mmol/L (ref 22–32)
CO2: 23 mmol/L (ref 22–32)
Calcium: 8.9 mg/dL (ref 8.9–10.3)
Calcium: 8.8 mg/dL — ABNORMAL LOW (ref 8.9–10.3)
Chloride: 105 mmol/L (ref 98–111)
Chloride: 105 mmol/L (ref 98–111)
Creatinine, Ser: 0.93 mg/dL (ref 0.61–1.24)
Creatinine, Ser: 0.83 mg/dL (ref 0.61–1.24)
GFR, Estimated: >60 mL/min (ref >60)
GFR, Estimated: >60 mL/min (ref >60)
Glucose, Bld: 109 mg/dL — ABNORMAL HIGH (ref 70–99)
Glucose, Bld: 140 mg/dL — ABNORMAL HIGH (ref 70–99)
Potassium: 3.7 mmol/L (ref 3.5–5.1)
Potassium: 3.5 mmol/L (ref 3.5–5.1)
Sodium: 138 mmol/L (ref 135–145)
Sodium: 137 mmol/L (ref 135–145)
Total Bilirubin: 0.7 mg/dL (ref 0.3–1.2)
Total Bilirubin: 0.7 mg/dL (ref 0.3–1.2)
Total Protein: 6.6 g/dL (ref 6.5–8.1)
Total Protein: 6.3 g/dL — ABNORMAL LOW (ref 6.5–8.1)

## 2021-06-07 LAB — APTT
aPTT: >200 seconds (ref 24–36)
aPTT: 39 seconds — ABNORMAL HIGH (ref 24–36)

## 2021-06-07 LAB — ECHO INTRAOPERATIVE TEE
AV Mean grad: 3 mmHg
Height: 72 in
Weight: 2720 oz

## 2021-06-07 LAB — PREPARE RBC (CROSSMATCH)

## 2021-06-07 LAB — ABO/RH: ABO/RH(D): O POS

## 2021-06-07 LAB — PLATELET COUNT: Platelets: 143 10*3/uL — ABNORMAL LOW (ref 150–400)

## 2021-06-07 LAB — LIPID PANEL
Cholesterol: 168 mg/dL (ref 0–200)
HDL: 37 mg/dL — ABNORMAL LOW (ref >40)
LDL Cholesterol: 122 mg/dL — ABNORMAL HIGH (ref 0–99)
Total CHOL/HDL Ratio: 4.5 RATIO
Triglycerides: 46 mg/dL (ref <150)
VLDL: 9 mg/dL (ref 0–40)

## 2021-06-07 LAB — HEMOGLOBIN AND HEMATOCRIT, BLOOD
HCT: 31.3 % — ABNORMAL LOW (ref 39.0–52.0)
Hemoglobin: 10.4 g/dL — ABNORMAL LOW (ref 13.0–17.0)

## 2021-06-07 LAB — PROTIME-INR
INR: 1.2 (ref 0.8–1.2)
INR: 1.5 — ABNORMAL HIGH (ref 0.8–1.2)
Prothrombin Time: 15 seconds (ref 11.4–15.2)
Prothrombin Time: 17.6 seconds — ABNORMAL HIGH (ref 11.4–15.2)

## 2021-06-07 LAB — MAGNESIUM: Magnesium: 3.2 mg/dL — ABNORMAL HIGH (ref 1.7–2.4)

## 2021-06-07 LAB — MRSA NEXT GEN BY PCR, NASAL: MRSA by PCR Next Gen: NOT DETECTED

## 2021-06-07 LAB — POCT ACTIVATED CLOTTING TIME: Activated Clotting Time: 214 seconds

## 2021-06-07 MED ORDER — MIDAZOLAM HCL 2 MG/2ML IJ SOLN
2.0000 mg | INTRAMUSCULAR | Status: DC | PRN
  Administered 2021-06-07: 2 mg via INTRAVENOUS
  Filled 2021-06-07: qty 2

## 2021-06-07 MED ORDER — MAGNESIUM SULFATE 4 GM/100ML IV SOLN
4.0000 g | Freq: Once | INTRAVENOUS | Status: AC
  Administered 2021-06-07: 4 g via INTRAVENOUS
  Filled 2021-06-07: qty 100

## 2021-06-07 MED ORDER — PHENYLEPHRINE HCL-NACL 20-0.9 MG/250ML-% IV SOLN: 0.0000 ug/min | INTRAVENOUS | Status: DC

## 2021-06-07 MED ORDER — ROCURONIUM BROMIDE 100 MG/10ML IV SOLN
INTRAVENOUS | Status: DC | PRN
  Administered 2021-06-07: 80 mg via INTRAVENOUS
  Administered 2021-06-07: 50 mg via INTRAVENOUS
  Administered 2021-06-07: 20 mg via INTRAVENOUS
  Administered 2021-06-07: 50 mg via INTRAVENOUS

## 2021-06-07 MED ORDER — ALBUMIN HUMAN 5 % IV SOLN
INTRAVENOUS | Status: AC
  Administered 2021-06-07: 12.5 g via INTRAVENOUS
  Filled 2021-06-07: qty 500

## 2021-06-07 MED ORDER — CEFAZOLIN SODIUM-DEXTROSE 2-4 GM/100ML-% IV SOLN
2.0000 g | INTRAVENOUS | Status: AC
  Administered 2021-06-07: 2 g via INTRAVENOUS
  Filled 2021-06-07: qty 100

## 2021-06-07 MED ORDER — TRANEXAMIC ACID (OHS) PUMP PRIME SOLUTION
2.0000 mg/kg | INTRAVENOUS | Status: DC
  Filled 2021-06-07: qty 1.54

## 2021-06-07 MED ORDER — ASPIRIN 81 MG PO CHEW: 324.0000 mg | CHEWABLE_TABLET | Freq: Every day | ORAL | Status: DC

## 2021-06-07 MED ORDER — FENTANYL CITRATE (PF) 250 MCG/5ML IJ SOLN
INTRAMUSCULAR | Status: AC
  Filled 2021-06-07: qty 5

## 2021-06-07 MED ORDER — LACTATED RINGERS IV SOLN: INTRAVENOUS | Status: DC | PRN

## 2021-06-07 MED ORDER — LIDOCAINE HCL (CARDIAC) PF 100 MG/5ML IV SOSY
PREFILLED_SYRINGE | INTRAVENOUS | Status: DC | PRN
  Administered 2021-06-07: 20 mg via INTRATRACHEAL

## 2021-06-07 MED ORDER — ONDANSETRON HCL 4 MG/2ML IJ SOLN: 4.0000 mg | Freq: Four times a day (QID) | INTRAMUSCULAR | Status: DC | PRN

## 2021-06-07 MED ORDER — PHENYLEPHRINE HCL-NACL 20-0.9 MG/250ML-% IV SOLN
30.0000 ug/min | INTRAVENOUS | Status: AC
  Administered 2021-06-07: 50 ug/min via INTRAVENOUS
  Filled 2021-06-07: qty 250

## 2021-06-07 MED ORDER — TRAMADOL HCL 50 MG PO TABS: 50.0000 mg | ORAL_TABLET | ORAL | Status: DC | PRN

## 2021-06-07 MED ORDER — MILRINONE LACTATE IN DEXTROSE 20-5 MG/100ML-% IV SOLN
0.3000 ug/kg/min | INTRAVENOUS | Status: DC
  Filled 2021-06-07: qty 100

## 2021-06-07 MED ORDER — POTASSIUM CHLORIDE 2 MEQ/ML IV SOLN
80.0000 meq | INTRAVENOUS | Status: DC
  Filled 2021-06-07: qty 40

## 2021-06-07 MED ORDER — ROCURONIUM BROMIDE 10 MG/ML (PF) SYRINGE
PREFILLED_SYRINGE | INTRAVENOUS | Status: AC
  Filled 2021-06-07: qty 30

## 2021-06-07 MED ORDER — SODIUM CHLORIDE 0.9% FLUSH
10.0000 mL | Freq: Two times a day (BID) | INTRAVENOUS | Status: DC
  Administered 2021-06-07 – 2021-06-08 (×3): 10 mL

## 2021-06-07 MED ORDER — SODIUM CHLORIDE (PF) 0.9 % IJ SOLN
OROMUCOSAL | Status: DC | PRN
  Administered 2021-06-07 (×4): 4 mL via TOPICAL

## 2021-06-07 MED ORDER — INSULIN REGULAR(HUMAN) IN NACL 100-0.9 UT/100ML-% IV SOLN: INTRAVENOUS | Status: DC

## 2021-06-07 MED ORDER — MORPHINE SULFATE (PF) 2 MG/ML IV SOLN
1.0000 mg | INTRAVENOUS | Status: DC | PRN
  Administered 2021-06-07 – 2021-06-08 (×3): 2 mg via INTRAVENOUS
  Filled 2021-06-07 (×3): qty 1

## 2021-06-07 MED ORDER — FAMOTIDINE IN NACL 20-0.9 MG/50ML-% IV SOLN
20.0000 mg | Freq: Two times a day (BID) | INTRAVENOUS | Status: AC
  Administered 2021-06-07 (×2): 20 mg via INTRAVENOUS
  Filled 2021-06-07 (×2): qty 50

## 2021-06-07 MED ORDER — ARTIFICIAL TEARS OPHTHALMIC OINT
TOPICAL_OINTMENT | OPHTHALMIC | Status: AC
  Filled 2021-06-07: qty 3.5

## 2021-06-07 MED ORDER — EPHEDRINE 5 MG/ML INJ
INTRAVENOUS | Status: AC
  Filled 2021-06-07: qty 10

## 2021-06-07 MED ORDER — TRANEXAMIC ACID 1000 MG/10ML IV SOLN
1.5000 mg/kg/h | INTRAVENOUS | Status: AC
  Administered 2021-06-07: 1.5 mg/kg/h via INTRAVENOUS
  Filled 2021-06-07: qty 25

## 2021-06-07 MED ORDER — VANCOMYCIN HCL IN DEXTROSE 1-5 GM/200ML-% IV SOLN
1000.0000 mg | Freq: Once | INTRAVENOUS | Status: AC
Start: 2021-06-07 — End: 2021-06-07
  Administered 2021-06-07: 1000 mg via INTRAVENOUS
  Filled 2021-06-07: qty 200

## 2021-06-07 MED ORDER — PROPOFOL 10 MG/ML IV BOLUS
INTRAVENOUS | Status: AC
  Filled 2021-06-07: qty 20

## 2021-06-07 MED ORDER — LIDOCAINE HCL (PF) 2 % IJ SOLN
INTRAMUSCULAR | Status: AC
  Filled 2021-06-07: qty 5

## 2021-06-07 MED ORDER — SODIUM CHLORIDE 0.9 % IV SOLN
1.0000 g | Freq: Once | INTRAVENOUS | Status: AC
  Administered 2021-06-07: 1 g via INTRAVENOUS
  Filled 2021-06-07: qty 10

## 2021-06-07 MED ORDER — DOCUSATE SODIUM 100 MG PO CAPS
200.0000 mg | ORAL_CAPSULE | Freq: Every day | ORAL | Status: DC
  Administered 2021-06-08 – 2021-06-15 (×8): 200 mg via ORAL
  Filled 2021-06-07 (×8): qty 2

## 2021-06-07 MED ORDER — BISACODYL 10 MG RE SUPP: 10.0000 mg | Freq: Every day | RECTAL | Status: DC

## 2021-06-07 MED ORDER — SODIUM CHLORIDE 0.45 % IV SOLN: INTRAVENOUS | Status: DC | PRN

## 2021-06-07 MED ORDER — FENTANYL CITRATE (PF) 250 MCG/5ML IJ SOLN
INTRAMUSCULAR | Status: DC | PRN
  Administered 2021-06-07 (×3): 100 ug via INTRAVENOUS
  Administered 2021-06-07: 50 ug via INTRAVENOUS
  Administered 2021-06-07: 100 ug via INTRAVENOUS
  Administered 2021-06-07 (×2): 250 ug via INTRAVENOUS
  Administered 2021-06-07: 200 ug via INTRAVENOUS
  Administered 2021-06-07: 250 ug via INTRAVENOUS
  Administered 2021-06-07: 50 ug via INTRAVENOUS
  Administered 2021-06-07: 250 ug via INTRAVENOUS
  Administered 2021-06-07: 50 ug via INTRAVENOUS

## 2021-06-07 MED ORDER — CHLORHEXIDINE GLUCONATE 0.12% ORAL RINSE (MEDLINE KIT)
15.0000 mL | Freq: Two times a day (BID) | OROMUCOSAL | Status: DC
  Administered 2021-06-07: 15 mL via OROMUCOSAL

## 2021-06-07 MED ORDER — METOPROLOL TARTRATE 5 MG/5ML IV SOLN
2.5000 mg | INTRAVENOUS | Status: DC | PRN
  Administered 2021-06-08 – 2021-06-12 (×2): 5 mg via INTRAVENOUS
  Filled 2021-06-07 (×2): qty 5

## 2021-06-07 MED ORDER — FENTANYL CITRATE (PF) 250 MCG/5ML IJ SOLN
INTRAMUSCULAR | Status: AC
  Filled 2021-06-07: qty 25

## 2021-06-07 MED ORDER — PROPOFOL 10 MG/ML IV BOLUS
INTRAVENOUS | Status: DC | PRN
  Administered 2021-06-07: 30 mg via INTRAVENOUS

## 2021-06-07 MED ORDER — ACETAMINOPHEN 160 MG/5ML PO SOLN: 650.0000 mg | Freq: Once | ORAL | Status: AC

## 2021-06-07 MED ORDER — LACTATED RINGERS IV SOLN: INTRAVENOUS | Status: DC

## 2021-06-07 MED ORDER — EPINEPHRINE HCL 5 MG/250ML IV SOLN IN NS
0.0000 ug/min | INTRAVENOUS | Status: AC
  Administered 2021-06-07: 2 ug/min via INTRAVENOUS
  Filled 2021-06-07: qty 250

## 2021-06-07 MED ORDER — PLASMA-LYTE A IV SOLN
INTRAVENOUS | Status: DC
  Filled 2021-06-07: qty 5

## 2021-06-07 MED ORDER — SODIUM CHLORIDE 0.9 % IV SOLN
250.0000 mL | INTRAVENOUS | Status: DC
  Administered 2021-06-07: 250 mL via INTRAVENOUS

## 2021-06-07 MED ORDER — HEMOSTATIC AGENTS (NO CHARGE) OPTIME
TOPICAL | Status: DC | PRN
  Administered 2021-06-07: 1 via TOPICAL

## 2021-06-07 MED ORDER — HEPARIN SODIUM (PORCINE) 1000 UNIT/ML IJ SOLN
INTRAMUSCULAR | Status: DC | PRN
  Administered 2021-06-07: 27000 [IU] via INTRAVENOUS

## 2021-06-07 MED ORDER — PHENYLEPHRINE 40 MCG/ML (10ML) SYRINGE FOR IV PUSH (FOR BLOOD PRESSURE SUPPORT)
PREFILLED_SYRINGE | INTRAVENOUS | Status: AC
  Filled 2021-06-07: qty 10

## 2021-06-07 MED ORDER — MAGNESIUM SULFATE 50 % IJ SOLN
40.0000 meq | INTRAMUSCULAR | Status: DC
  Filled 2021-06-07: qty 9.85

## 2021-06-07 MED ORDER — ALBUMIN HUMAN 5 % IV SOLN: 12.5000 g | Freq: Once | INTRAVENOUS | Status: AC

## 2021-06-07 MED ORDER — NITROGLYCERIN IN D5W 200-5 MCG/ML-% IV SOLN: 0.0000 ug/min | INTRAVENOUS | Status: DC

## 2021-06-07 MED ORDER — BISACODYL 5 MG PO TBEC
10.0000 mg | DELAYED_RELEASE_TABLET | Freq: Every day | ORAL | Status: DC
  Administered 2021-06-08 – 2021-06-14 (×6): 10 mg via ORAL
  Filled 2021-06-07 (×6): qty 2

## 2021-06-07 MED ORDER — ACETAMINOPHEN 160 MG/5ML PO SOLN: 1000.0000 mg | Freq: Four times a day (QID) | ORAL | Status: AC

## 2021-06-07 MED ORDER — METOPROLOL TARTRATE 12.5 MG HALF TABLET
12.5000 mg | ORAL_TABLET | Freq: Two times a day (BID) | ORAL | Status: DC
  Administered 2021-06-07 – 2021-06-08 (×3): 12.5 mg via ORAL
  Filled 2021-06-07 (×3): qty 1

## 2021-06-07 MED ORDER — 0.9 % SODIUM CHLORIDE (POUR BTL) OPTIME
TOPICAL | Status: DC | PRN
  Administered 2021-06-07: 4000 mL

## 2021-06-07 MED ORDER — OXYCODONE HCL 5 MG PO TABS
5.0000 mg | ORAL_TABLET | ORAL | Status: DC | PRN
  Administered 2021-06-13 – 2021-06-14 (×2): 10 mg via ORAL
  Filled 2021-06-07 (×2): qty 2

## 2021-06-07 MED ORDER — VANCOMYCIN HCL 1250 MG/250ML IV SOLN
1250.0000 mg | INTRAVENOUS | Status: AC
  Administered 2021-06-07: 1250 mg via INTRAVENOUS
  Filled 2021-06-07: qty 250

## 2021-06-07 MED ORDER — DEXMEDETOMIDINE HCL IN NACL 400 MCG/100ML IV SOLN
0.0000 ug/kg/h | INTRAVENOUS | Status: DC
  Administered 2021-06-07: 0.7 ug/kg/h via INTRAVENOUS
  Administered 2021-06-07: 0.4 ug/kg/h via INTRAVENOUS
  Filled 2021-06-07: qty 100

## 2021-06-07 MED ORDER — NITROGLYCERIN IN D5W 200-5 MCG/ML-% IV SOLN
2.0000 ug/min | INTRAVENOUS | Status: AC
  Administered 2021-06-07: 5 ug/min via INTRAVENOUS
  Filled 2021-06-07: qty 250

## 2021-06-07 MED ORDER — HEPARIN SODIUM (PORCINE) 1000 UNIT/ML IJ SOLN
INTRAMUSCULAR | Status: AC
  Filled 2021-06-07: qty 2

## 2021-06-07 MED ORDER — KETOTIFEN FUMARATE 0.025 % OP SOLN
1.0000 [drp] | Freq: Two times a day (BID) | OPHTHALMIC | Status: DC
  Administered 2021-06-07 – 2021-06-15 (×16): 1 [drp] via OPHTHALMIC
  Filled 2021-06-07: qty 5

## 2021-06-07 MED ORDER — SODIUM CHLORIDE 0.9% FLUSH: 10.0000 mL | INTRAVENOUS | Status: DC | PRN

## 2021-06-07 MED ORDER — ASPIRIN EC 325 MG PO TBEC
325.0000 mg | DELAYED_RELEASE_TABLET | Freq: Every day | ORAL | Status: DC
  Administered 2021-06-08 – 2021-06-15 (×9): 325 mg via ORAL
  Filled 2021-06-07 (×9): qty 1

## 2021-06-07 MED ORDER — ALBUMIN HUMAN 5 % IV SOLN
INTRAVENOUS | Status: AC
  Administered 2021-06-07: 12.5 g via INTRAVENOUS
  Filled 2021-06-07: qty 250

## 2021-06-07 MED ORDER — MIDAZOLAM HCL (PF) 10 MG/2ML IJ SOLN
INTRAMUSCULAR | Status: AC
  Filled 2021-06-07: qty 2

## 2021-06-07 MED ORDER — ORAL CARE MOUTH RINSE
15.0000 mL | Freq: Two times a day (BID) | OROMUCOSAL | Status: DC
  Administered 2021-06-07 – 2021-06-14 (×12): 15 mL via OROMUCOSAL

## 2021-06-07 MED ORDER — NOREPINEPHRINE 4 MG/250ML-% IV SOLN
0.0000 ug/min | INTRAVENOUS | Status: DC
Start: 2021-06-07 — End: 2021-06-07
  Filled 2021-06-07: qty 250

## 2021-06-07 MED ORDER — METOPROLOL TARTRATE 25 MG/10 ML ORAL SUSPENSION: 12.5000 mg | Freq: Two times a day (BID) | ORAL | Status: DC

## 2021-06-07 MED ORDER — SODIUM CHLORIDE 0.9 % IV SOLN
INTRAVENOUS | Status: AC | PRN
  Administered 2021-06-06: 10 mL/h via INTRAVENOUS

## 2021-06-07 MED ORDER — PLASMA-LYTE A IV SOLN
INTRAVENOUS | Status: DC | PRN
  Administered 2021-06-07: 1000 mL via INTRAVASCULAR

## 2021-06-07 MED ORDER — PROTAMINE SULFATE 10 MG/ML IV SOLN
INTRAVENOUS | Status: AC
  Filled 2021-06-07: qty 10

## 2021-06-07 MED ORDER — PANTOPRAZOLE SODIUM 40 MG PO TBEC
40.0000 mg | DELAYED_RELEASE_TABLET | Freq: Every day | ORAL | Status: DC
  Administered 2021-06-09 – 2021-06-13 (×5): 40 mg via ORAL
  Filled 2021-06-07 (×7): qty 1

## 2021-06-07 MED ORDER — VASOPRESSIN 20 UNITS/100 ML INFUSION FOR SHOCK
0.0000 [IU]/min | INTRAVENOUS | Status: DC
  Filled 2021-06-07: qty 100

## 2021-06-07 MED ORDER — SODIUM CHLORIDE 0.9 % IV SOLN
INTRAVENOUS | Status: DC
  Filled 2021-06-07: qty 30

## 2021-06-07 MED ORDER — CHLORHEXIDINE GLUCONATE CLOTH 2 % EX PADS: 6.0000 | MEDICATED_PAD | Freq: Every day | CUTANEOUS | Status: DC

## 2021-06-07 MED ORDER — CEFAZOLIN SODIUM-DEXTROSE 2-4 GM/100ML-% IV SOLN
2.0000 g | Freq: Three times a day (TID) | INTRAVENOUS | Status: AC
  Administered 2021-06-07 – 2021-06-09 (×6): 2 g via INTRAVENOUS
  Filled 2021-06-07 (×6): qty 100

## 2021-06-07 MED ORDER — TRANEXAMIC ACID (OHS) BOLUS VIA INFUSION
15.0000 mg/kg | INTRAVENOUS | Status: AC
  Administered 2021-06-07: 1156.5 mg via INTRAVENOUS
  Filled 2021-06-07: qty 1157

## 2021-06-07 MED ORDER — LACTATED RINGERS IV SOLN
500.0000 mL | Freq: Once | INTRAVENOUS | Status: AC | PRN
  Administered 2021-06-07: 500 mL via INTRAVENOUS

## 2021-06-07 MED ORDER — PROTAMINE SULFATE 10 MG/ML IV SOLN
INTRAVENOUS | Status: DC | PRN
  Administered 2021-06-07: 270 mg via INTRAVENOUS

## 2021-06-07 MED ORDER — IOHEXOL 350 MG/ML SOLN
INTRAVENOUS | Status: DC | PRN
  Administered 2021-06-07: 30 mL via INTRA_ARTERIAL

## 2021-06-07 MED ORDER — SODIUM CHLORIDE 0.9% FLUSH
3.0000 mL | Freq: Two times a day (BID) | INTRAVENOUS | Status: DC
  Administered 2021-06-08 – 2021-06-09 (×2): 3 mL via INTRAVENOUS

## 2021-06-07 MED ORDER — ACETAMINOPHEN 650 MG RE SUPP
650.0000 mg | Freq: Once | RECTAL | Status: AC
  Administered 2021-06-07: 650 mg via RECTAL

## 2021-06-07 MED ORDER — ALBUMIN HUMAN 5 % IV SOLN
250.0000 mL | INTRAVENOUS | Status: AC | PRN
  Administered 2021-06-07 (×4): 12.5 g via INTRAVENOUS
  Filled 2021-06-07 (×2): qty 250

## 2021-06-07 MED ORDER — CHLORHEXIDINE GLUCONATE 0.12 % MT SOLN
15.0000 mL | OROMUCOSAL | Status: AC
  Administered 2021-06-07: 15 mL via OROMUCOSAL

## 2021-06-07 MED ORDER — SODIUM CHLORIDE 0.9 % IV SOLN: INTRAVENOUS | Status: DC

## 2021-06-07 MED ORDER — DEXTROSE 50 % IV SOLN: 0.0000 mL | INTRAVENOUS | Status: DC | PRN

## 2021-06-07 MED ORDER — SODIUM CHLORIDE 0.9% FLUSH: 3.0000 mL | INTRAVENOUS | Status: DC | PRN

## 2021-06-07 MED ORDER — POTASSIUM CHLORIDE 10 MEQ/50ML IV SOLN
10.0000 meq | INTRAVENOUS | Status: AC
  Administered 2021-06-07 (×3): 10 meq via INTRAVENOUS

## 2021-06-07 MED ORDER — PROTAMINE SULFATE 10 MG/ML IV SOLN
INTRAVENOUS | Status: AC
  Filled 2021-06-07: qty 50

## 2021-06-07 MED ORDER — ALBUMIN HUMAN 5 % IV SOLN
12.5000 g | INTRAVENOUS | Status: AC
  Administered 2021-06-07: 12.5 g via INTRAVENOUS

## 2021-06-07 MED ORDER — MIDAZOLAM HCL 5 MG/5ML IJ SOLN
INTRAMUSCULAR | Status: DC | PRN
  Administered 2021-06-07: 2 mg via INTRAVENOUS
  Administered 2021-06-07: 1 mg via INTRAVENOUS
  Administered 2021-06-07 (×3): 2 mg via INTRAVENOUS
  Administered 2021-06-07: 1 mg via INTRAVENOUS

## 2021-06-07 MED ORDER — DEXMEDETOMIDINE HCL IN NACL 400 MCG/100ML IV SOLN
0.1000 ug/kg/h | INTRAVENOUS | Status: AC
  Administered 2021-06-07: .5 ug/kg/h via INTRAVENOUS
  Filled 2021-06-07: qty 100

## 2021-06-07 MED ORDER — ORAL CARE MOUTH RINSE
15.0000 mL | OROMUCOSAL | Status: DC
  Administered 2021-06-07 (×4): 15 mL via OROMUCOSAL

## 2021-06-07 MED ORDER — CHLORHEXIDINE GLUCONATE CLOTH 2 % EX PADS
6.0000 | MEDICATED_PAD | Freq: Every day | CUTANEOUS | Status: DC
  Administered 2021-06-07 – 2021-06-12 (×6): 6 via TOPICAL

## 2021-06-07 MED ORDER — ACETAMINOPHEN 500 MG PO TABS
1000.0000 mg | ORAL_TABLET | Freq: Four times a day (QID) | ORAL | Status: AC
  Administered 2021-06-07 – 2021-06-12 (×17): 1000 mg via ORAL
  Filled 2021-06-07 (×17): qty 2

## 2021-06-07 MED ORDER — INSULIN REGULAR(HUMAN) IN NACL 100-0.9 UT/100ML-% IV SOLN
INTRAVENOUS | Status: AC
  Administered 2021-06-07: 1 [IU]/h via INTRAVENOUS
  Filled 2021-06-07: qty 100

## 2021-06-07 NOTE — Progress Notes (Signed)
1 Day Post-Op Procedure(s) (LRB): CORONARY ARTERY BYPASS GRAFTING (CABG) TIMES FOUR, ON PUMP, USING LEFT INTERNAL MAMMARY ARTERY AND ENDOSCOPICALLY HARVESTED RIGHT GREATER SAPHENOUS VEIN (N/A) TRANSESOPHAGEAL ECHOCARDIOGRAM (TEE) ENDOVEIN HARVEST OF GREATER SAPHENOUS VEIN (Right) Subjective: Intubated, sedated  Objective: Vital signs in last 24 hours: Temp:  [93.74 F (34.3 C)-98 F (36.7 C)] 94.46 F (34.7 C) (06/12 0745) Pulse Rate:  [38-114] 38 (06/12 0745) Cardiac Rhythm: Normal sinus rhythm (06/12 0630) Resp:  [11-31] 12 (06/12 0750) BP: (109-163)/(49-108) 146/94 (06/12 0745) SpO2:  [86 %-100 %] 100 % (06/12 0750) Arterial Line BP: (100-187)/(37-98) 148/53 (06/12 0745) FiO2 (%):  [50 %] 50 % (06/12 0750) Weight:  [77.1 kg] 77.1 kg (06/11 2250)  Hemodynamic parameters for last 24 hours: PAP: (25-36)/(14-23) 31/17 CO:  [2.2 L/min-2.3 L/min] 2.3 L/min CI:  [1.1 L/min/m2-1.2 L/min/m2] 1.2 L/min/m2  Intake/Output from previous day: 06/11 0701 - 06/12 0700 In: 3143.7 [I.V.:2386.9; Blood:300; IV Piggyback:456.8] Out: 1505 [Urine:1465; Chest Tube:40] Intake/Output this shift: No intake/output data recorded.  General appearance: alert, cooperative, and no distress Neurologic: sedated Heart: regular rate and rhythm and IABP audible Lungs: clear to auscultation bilaterally  Lab Results: Recent Labs    06/06/21 2355 06/07/21 0028 06/07/21 0403 06/07/21 0416 06/07/21 0628 06/07/21 0630  WBC 8.1  --   --   --   --  16.0*  HGB 15.2   < > 10.4*   < > 11.2* 11.8*  HCT 44.8   < > 31.3*   < > 33.0* 35.3*  PLT 216  --  143*  --   --  136*   < > = values in this interval not displayed.   BMET:  Recent Labs    06/06/21 2252 06/06/21 2355 06/07/21 0028 06/07/21 0416 06/07/21 0509 06/07/21 0517 06/07/21 0628  NA 138 137   < > 137   < > 141 142  K 3.7 3.5   < > 5.4*   < > 3.9 3.8  CL 105 105   < > 104  --  104  --   CO2 26 23  --   --   --   --   --   GLUCOSE 109* 140*    < > 114*  --  140*  --   BUN 16 13   < > 13  --  13  --   CREATININE 0.93 0.83   < > 0.60*  --  0.60*  --   CALCIUM 8.9 8.8*  --   --   --   --   --    < > = values in this interval not displayed.    PT/INR:  Recent Labs    06/07/21 0630  LABPROT 17.6*  INR 1.5*   ABG    Component Value Date/Time   PHART 7.446 06/07/2021 0628   HCO3 21.8 06/07/2021 0628   TCO2 23 06/07/2021 0628   ACIDBASEDEF 2.0 06/07/2021 0628   O2SAT 99.0 06/07/2021 0628   CBG (last 3)  Recent Labs    06/07/21 0627 06/07/21 0738  GLUCAP 125* 124*    Assessment/Plan: S/P Procedure(s) (LRB): CORONARY ARTERY BYPASS GRAFTING (CABG) TIMES FOUR, ON PUMP, USING LEFT INTERNAL MAMMARY ARTERY AND ENDOSCOPICALLY HARVESTED RIGHT GREATER SAPHENOUS VEIN (N/A) TRANSESOPHAGEAL ECHOCARDIOGRAM (TEE) ENDOVEIN HARVEST OF GREATER SAPHENOUS VEIN (Right) S/p Emergency CABG CV- in SR, index low in setting of hypovolemia- large volume of urine output  Volume resuscitation, IABP at 1:2- hopefully dc later today RESP_ wean vent when hemodynamics improve,  warm and waking up RENAL- high UO- K= 3.8- supplment K ENDO- CBG well controlled Minimal CT output Doing well early postop   LOS: 0 days    [name] 06/07/2021

## 2021-06-07 NOTE — Brief Op Note (Signed)
06/06/2021 - 06/07/2021  8:08 AM  PATIENT:  Chad Washington  80 y.o. male  PRE-OPERATIVE DIAGNOSIS:  LEFT MAIN DISEASE  POST-OPERATIVE DIAGNOSIS:  LEFT MAIN DISEASE  PROCEDURE:  Procedure(s): CORONARY ARTERY BYPASS GRAFTING (CABG) TIMES FOUR, ON PUMP, USING LEFT INTERNAL MAMMARY ARTERY AND ENDOSCOPICALLY HARVESTED RIGHT GREATER SAPHENOUS VEIN (N/A) TRANSESOPHAGEAL ECHOCARDIOGRAM (TEE) ENDOVEIN HARVEST OF GREATER SAPHENOUS VEIN (Right) [place] SVG-DIAG SEQ SVG-OM1-OM2 EVH 45 MIN  SURGEON:  Surgeon(s) and Role:    * [name], MD - Primary  PHYSICIAN ASSISTANT: WAYNE GOLD PA-C  ASSISTANTS: STAFF   ANESTHESIA:   general  EBL:  500 ml  BLOOD ADMINISTERED:none  DRAINS:  LEFT PLEURAL AND MEDIASTINAL CHEST TUBES    LOCAL MEDICATIONS USED:  NONE  SPECIMEN:  No Specimen  DISPOSITION OF SPECIMEN:  N/A  COUNTS:  YES  TOURNIQUET:  * No tourniquets in log *  DICTATION: .Other Dictation: Dictation Number PENDING  PLAN OF CARE: Admit to inpatient   PATIENT DISPOSITION:  ICU - intubated and hemodynamically stable.   Delay start of Pharmacological VTE agent (>24hrs) due to surgical blood loss or risk of bleeding: yes  COMPLICATIONS: NO KNOWN

## 2021-06-07 NOTE — Anesthesia Procedure Notes (Signed)
Procedure Name: Intubation Date/Time: 06/07/2021 1:21 AM Performed by: Clovis Cao, CRNA Pre-anesthesia Checklist: Patient identified, Emergency Drugs available, Suction available and Patient being monitored Patient Re-evaluated:Patient Re-evaluated prior to induction Oxygen Delivery Method: Circle system utilized Preoxygenation: Pre-oxygenation with 100% oxygen Induction Type: IV induction Ventilation: Mask ventilation without difficulty Laryngoscope Size: Miller and 1 Grade View: Grade II Tube type: Oral Tube size: 8.0 mm Number of attempts: 1 Airway Equipment and Method: Stylet and Oral airway Placement Confirmation: ETT inserted through vocal cords under direct vision, positive ETCO2 and breath sounds checked- equal and bilateral Secured at: 23 cm Tube secured with: Tape Dental Injury: Teeth and Oropharynx as per pre-operative assessment

## 2021-06-07 NOTE — Anesthesia Postprocedure Evaluation (Signed)
Anesthesia Post Note  Patient: Chad Washington  Procedure(s) Performed: CORONARY ARTERY BYPASS GRAFTING (CABG) TIMES FOUR, ON PUMP, USING LEFT INTERNAL MAMMARY ARTERY AND ENDOSCOPICALLY HARVESTED RIGHT GREATER SAPHENOUS VEIN (Chest) TRANSESOPHAGEAL ECHOCARDIOGRAM (TEE) ENDOVEIN HARVEST OF GREATER SAPHENOUS VEIN (Right)     Patient location during evaluation: SICU Anesthesia Type: General Level of consciousness: patient remains intubated per anesthesia plan and sedated Pain management: pain level controlled Vital Signs Assessment: post-procedure vital signs reviewed and stable Respiratory status: patient on ventilator - see flowsheet for VS and patient remains intubated per anesthesia plan Cardiovascular status: stable (on phenylephrine infusion) Postop Assessment: no apparent nausea or vomiting Anesthetic complications: no Comments: Pt remains stable   No notable events documented.  Last Vitals:  Vitals:   06/07/21 0038 06/07/21 0043  BP: (!) 134/49 (!) 110/56  Pulse: 77 71  Resp: 19 17  Temp:    SpO2: 100% 100%    Last Pain:  Vitals:   06/07/21 0045  TempSrc:   PainSc: 5                  Dewanda Fennema,E. Kortlynn Poust

## 2021-06-07 NOTE — Procedures (Signed)
Extubation Procedure Note  Patient Details:   Name: Chad Washington DOB: [birth year] MRN: [id number]   Airway Documentation:    Vent end date: 06/07/21 Vent end time: 1459   Evaluation  O2 sats: stable throughout Complications: No apparent complications Patient did tolerate procedure well. Bilateral Breath Sounds: Rhonchi, Diminished   Yes, pt could speak post extubation.  Pt extubated to 4 l/m Hoskins per protocol.  Earney Navy 06/07/2021, 2:59 PM

## 2021-06-07 NOTE — Plan of Care (Signed)
  Problem: Education: Goal: Knowledge of disease or condition will improve Outcome: Progressing   Problem: Activity: Goal: Risk for activity intolerance will decrease Outcome: Not Progressing Note: Able to turn and reposition within limits but remains on bedrest due to IABP at the moment   Problem: Cardiac: Goal: Will achieve and/or maintain hemodynamic stability Outcome: Progressing   Problem: Clinical Measurements: Goal: Postoperative complications will be avoided or minimized Outcome: Progressing   Problem: Respiratory: Goal: Respiratory status will improve Outcome: Progressing Note: Extubated today, O2 needs minimal, doing C, DB, & IS well   Problem: Skin Integrity: Goal: Wound healing without signs and symptoms of infection Outcome: Progressing Goal: Risk for impaired skin integrity will decrease Outcome: Progressing   Problem: Education: Goal: Knowledge of General Education information will improve Description: Including pain rating scale, medication(s)/side effects and non-pharmacologic comfort measures Outcome: Progressing   Problem: Clinical Measurements: Goal: Ability to maintain clinical measurements within normal limits will improve Outcome: Progressing Goal: Will remain free from infection Outcome: Progressing Goal: Diagnostic test results will improve Outcome: Progressing Goal: Respiratory complications will improve Outcome: Progressing Goal: Cardiovascular complication will be avoided Outcome: Progressing   Problem: Activity: Goal: Risk for activity intolerance will decrease Outcome: Progressing   Problem: Coping: Goal: Level of anxiety will decrease Outcome: Progressing   Problem: Pain Managment: Goal: General experience of comfort will improve Outcome: Progressing   Problem: Safety: Goal: Ability to remain free from injury will improve Outcome: Progressing   Problem: Skin Integrity: Goal: Risk for impaired skin integrity will  decrease Outcome: Progressing

## 2021-06-07 NOTE — H&P (Addendum)
Cardiology Admission History and Physical:   Patient ID: Chad Washington MRN: [id number]; DOB: 02-17-1941   Admission date: 06/06/2021  PCP:  [name], MD   [facility] Providers Cardiologist:  None   {  Chief Complaint:  Chest pain  Patient Profile:   Chad Washington is a 80 y.o. male with pmh sx for GERD who is being seen 06/07/2021 for the evaluation of chest pain concerning for STEMI  History of Present Illness:   Mr. Chad Washington is a very pleasant 80 year old male with no significant pmh who came in with CP this evening. He had some CP this morning as well; EMS was called; EKG was fine; and he decided to stay home. However around 10 pm CP reoccurred; EMS was called, and EKG showed ST elevations in anterior leads with reciprocal changes. No prior history of CAD. Only takes aspirin and PPI. No SOB. Chest pain pressure like and radiating. HD stable otherwise. Cath lab was activated. He received aspirin, NTG and Heparin 4000 U in the ED.    Past Medical History:  Diagnosis Date   Asthma    allergy induced asthma   GERD (gastroesophageal reflux disease)     Past Surgical History:  Procedure Laterality Date   COLONOSCOPY WITH PROPOFOL N/A 06/21/2017   Procedure: COLONOSCOPY WITH PROPOFOL;  Surgeon: [name], MD;  Location: [facility];  Service: Endoscopy;  Laterality: N/A;   EYE SURGERY Bilateral 6 yrs ago   ioc lens implants   HERNIA REPAIR     inguinal hernia left 8- 10 yrs ago, right inguinal hernia age 82     Medications Prior to Admission: Prior to Admission medications   Medication Sig Start Date End Date Taking? Authorizing Provider  azelastine (OPTIVAR) 0.05 % ophthalmic solution Place 1 drop into both eyes 2 (two) times daily.   Yes [provider]  cetirizine (ZYRTEC) 10 MG tablet Take 10 mg by mouth daily at 6 PM.   Yes [provider]  fluticasone (FLOVENT HFA) 110 MCG/ACT inhaler Inhale 1 puff into the lungs 2 (two)  times daily.   Yes [provider]  Ibuprofen-diphenhydrAMINE HCl 200-25 MG CAPS Take 1 tablet by mouth at bedtime as needed (sleep).   Yes [provider]  montelukast (SINGULAIR) 10 MG tablet Take 10 mg by mouth daily at 6 PM.   Yes [provider]  Multiple Vitamins-Minerals (CENTRUM SILVER PO) Take 1 tablet by mouth daily.   Yes [provider]  albuterol (VENTOLIN HFA) 108 (90 Base) MCG/ACT inhaler Inhale 1-2 puffs into the lungs every 6 (six) hours as needed. 03/25/21   [provider]  dexlansoprazole (DEXILANT) 60 MG capsule Take 60 mg by mouth daily.    [provider]  EPINEPHrine (EPIPEN 2-PAK) 0.3 mg/0.3 mL IJ SOAJ injection Inject 0.3 mg into the muscle as needed (allergic reaction).    [provider]  Hypromellose 0.2 % SOLN Place 2 drops into both eyes as needed.    [provider]     Allergies:    Allergies  Allergen Reactions   Latex Rash    Social History:   Social History   Socioeconomic History   Marital status: Married    Spouse name: Not on file   Number of children: Not on file   Years of education: Not on file   Highest education level: Not on file  Occupational History   Not on file  Tobacco Use   Smoking status:  Never   Smokeless tobacco: Never  Vaping Use   Vaping Use: Never used  Substance and Sexual Activity   Alcohol use: Yes    Alcohol/week: 0.0 standard drinks    Comment: occasionally   Drug use: No   Sexual activity: Yes    Partners: Male  Other Topics Concern   Not on file  Social History Narrative   Not on file   Social Determinants of Health   Financial Resource Strain: Not on file  Food Insecurity: Not on file  Transportation Needs: Not on file  Physical Activity: Not on file  Stress: Not on file  Social Connections: Not on file  Intimate Partner Violence: Not on file    Family History:   The patient's family history includes Cancer in his father;  Diabetes in his father; Hypertension in his mother.    ROS:  Please see the history of present illness.  All other ROS reviewed and negative.     Physical Exam/Data:   Vitals:   06/06/21 2256 06/06/21 2300 06/06/21 2322 06/07/21 0029  BP:  117/77  129/70  Pulse:  64    Resp:  11    Temp:      TempSrc:      SpO2: 96% 97% 99%   Weight:      Height:       No intake or output data in the 24 hours ending 06/07/21 0114 Last 3 Weights 06/06/2021 06/21/2017 01/11/2015  Weight (lbs) 170 lb 177 lb 177 lb  Weight (kg) 77.111 kg 80.287 kg 80.287 kg     Body mass index is 23.06 kg/m.  General:  Well nourished, well developed, in mild distress HEENT: normal Lymph: no adenopathy Neck: no JVD Endocrine:  No thryomegaly Vascular: No carotid bruits; FA pulses 2+ bilaterally without bruits  Cardiac:  normal S1, S2; RRR; no murmur  Lungs:  clear to auscultation bilaterally, no wheezing, rhonchi or rales  Abd: soft, nontender, no hepatomegaly  Ext: no leg edema Musculoskeletal:  No deformities, BUE and BLE strength normal and equal Skin: warm and dry  Neuro:  CNs 2-12 intact, no focal abnormalities noted Psych:  Normal affect    EKG:  The ECG that was done and was personally reviewed and demonstrates ST elevation in anterior leads  Relevant CV Studies:   Laboratory Data:  High Sensitivity Troponin:  No results for input(s): TROPONINIHS in the last 720 hours.    Chemistry Recent Labs  Lab 06/07/21 0028  NA 141  K 3.9  CL 105  GLUCOSE 147*  BUN 17  CREATININE 0.70    No results for input(s): PROT, ALBUMIN, AST, ALT, ALKPHOS, BILITOT in the last 168 hours. Hematology Recent Labs  Lab 06/06/21 2252 06/06/21 2355 06/07/21 0028  WBC 9.8 8.1  --   RBC 4.94 4.78  --   HGB 15.8 15.2 15.0  HCT 47.2 44.8 44.0  MCV 95.5 93.7  --   MCH 32.0 31.8  --   MCHC 33.5 33.9  --   RDW 13.9 13.8  --   PLT 236 216  --    BNPNo results for input(s): BNP, PROBNP in the last 168 hours.   DDimer No results for input(s): DDIMER in the last 168 hours.   Radiology/Studies:  CARDIAC CATHETERIZATION  Result Date: 06/07/2021  Ramus lesion is 85% stenosed.  Dist LM to Ost LAD lesion is 95% stenosed.  Ost Cx lesion is 95% stenosed.  Prox LAD lesion is 95% stenosed.  1st  Diag lesion is 70% stenosed.  Prox RCA lesion is 30% stenosed.  RPDA lesion is 50% stenosed.  Severe 95% distal left main stenosis percent proximal LAD stenosis, 70% diffuse first diagonal stenosis, 95% ostial left circumflex stenosis.  The RCA has mild luminal irregularity with narrowings of 20 and 30%.  There is a 50% ostial narrowing and a small PDA vessel. Acute LV dysfunction with EF estimate approximately 50% with mild distal anterior lateral/apical hypocontractility.  LVEDP 23 mmHg. Insertion of intra-aortic balloon pump for hemodynamic support prior to emergent CaBG surgery this evening. RECOMMENDATION: The patient will be taken to the operating room by Dr. Modesto Charon emergently for critical left main stenosis with subtotal LAD and ostial circumflex stenoses.     Assessment and Plan:   # Anterior STEMI -Aspirin, Heparin and NTG given in ED -Hold off on P2Y12 for now -LHC which showed severe distal left main disease with proximal circumflex and LAD disease. -IABP placed in the lab -CT surgery consult for CABG -Continue to monitor closely    For questions or updates, please contact [place] Please consult [email or web address] for contact info under     Signed, Jaci Lazier, MD  06/07/2021 1:14 AM     Patient seen and examined. Agree with assessment and plan. Mr Jaedyn Lard is a very pleasant retired Psychologist, sport and exercise at [employer] who denies any known cardiac disease.  He has a history of GERD for which he takes Dexilant.  On the morning of June 11 he developed mild chest pain and shoulder discomfort.  He called EMS.  EMS arrived to his house.  ECG was reportedly normal.  His chest pain  subsided.  In the evening at around 10 to 10:15 PM he developed recurrent left shoulder chest discomfort.  He was ultimately brought to [facility] ER and was given aspirin by EMS.  ECG showed anterior ST elevation consistent and a code STEMI was activated.  When I examined him in the emergency room he was still having residual pain.  He was given heparin 4000 units with some slight improvement in discomfort.  I discussed the need for emergent catheterization and he was brought urgently to the catheterization laboratory.  I performed the catheterization procedure via the right radial approach.  Catheterization revealed critical distal left main stenosis of at least 95% with ostial 95% circumflex stenosis as well as near ostial 95% LAD stenosis.  There was diagonal disease of approximately 60%.  His RCA had mild luminal irregularity without high-grade stenosis although it was 50% narrowing and a small PDA1 vessel.  During the procedure I discussed with the patient that he had very critical anatomy.  He was given additional heparin in the laboratory.  I felt he needed emergent CABG revascularization surgery in spoke with Dr. Roxan Hockey on call for cardiovascular surgery.  I placed an intra-aortic balloon pump to allow for more optimal perfusion, recheck an ACT and gave additional heparin.  He was alert and oriented throughout the procedure.  Dr. Roxan Hockey spoke with him about the need for urgent CABG.  I contacted the patient's niece who was staying with his wife and his son was driving to [place] in transit from [place].  His aortic balloon pump and radial sheath were sutured in place.  He was taken emergently from the cardiac catheterization laboratory to the OR for emergent surgical revascularization.  [name], MD, [facility] 06/07/2021 8:50 AM

## 2021-06-07 NOTE — Anesthesia Procedure Notes (Addendum)
  Central Venous Catheter Insertion Performed by: Annye Asa, MD, anesthesiologist Start/End6/11/2021 12:58 AM, 06/07/2021 1:10 AM Patient location: OR. Emergency situation Preanesthetic checklist: patient identified, IV checked, risks and benefits discussed, surgical consent, monitors and equipment checked, pre-op evaluation, timeout performed and anesthesia consent Position: supine Lidocaine 1% used for infiltration and patient sedated Hand hygiene performed , maximum sterile barriers used  and Seldinger technique used Catheter size: 8.5 Fr PA cath was placed.Sheath introducer Swan type:thermodilution Procedure performed using ultrasound guided technique. Ultrasound Notes:anatomy identified, needle tip was noted to be adjacent to the nerve/plexus identified, no ultrasound evidence of intravascular and/or intraneural injection and image(s) printed for medical record Attempts: 1 Following insertion, line sutured, dressing applied and Biopatch. Post procedure assessment: blood return through all ports, free fluid flow and no air  Patient tolerated the procedure well with no immediate complications. Additional procedure comments: PA catheter:  Routine monitors. Timeout, sterile prep, drape, FBP R neck.  Supine position.  1% Lido local, finder and trocar RIJ 1st pass with US guidance.  Cordis placed over J wire. PA catheter in easily.  Sterile dressing applied.  Patient tolerated well, VSS.  Jenita Seashore, MD .

## 2021-06-07 NOTE — Consult Note (Signed)
Reason for Consult:Left main disease Referring Physician: Dr. [name] Newmann is an 80 y.o. male.  HPI: 80 yo man with no prior CAD presented with CP. STEMI on ECG and taken emergently to cath lab by DR. Claiborne Billings. Cath showed severe distal left main disease with proximal circumflex and LAD disease.  Currently hemodynamically stable. IABP placed for coronary perfusion.  Past Medical History:  Diagnosis Date   Asthma    allergy induced asthma   GERD (gastroesophageal reflux disease)     Past Surgical History:  Procedure Laterality Date   COLONOSCOPY WITH PROPOFOL N/A 06/21/2017   Procedure: COLONOSCOPY WITH PROPOFOL;  Surgeon: [name], MD;  Location: [facility];  Service: Endoscopy;  Laterality: N/A;   EYE SURGERY Bilateral 6 yrs ago   ioc lens implants   HERNIA REPAIR     inguinal hernia left 8- 10 yrs ago, right inguinal hernia age 43    Family History  Problem Relation Age of Onset   Hypertension Mother    Cancer Father    Diabetes Father     Social History:  reports that he has never smoked. He has never used smokeless tobacco. He reports current alcohol use. He reports that he does not use drugs.  Allergies:  Allergies  Allergen Reactions   Latex Rash    Medications: Prior to Admission:  Medications Prior to Admission  Medication Sig Dispense Refill Last Dose   azelastine (OPTIVAR) 0.05 % ophthalmic solution Place 1 drop into both eyes 2 (two) times daily.   06/06/2021   cetirizine (ZYRTEC) 10 MG tablet Take 10 mg by mouth daily at 6 PM.   06/05/2021   fluticasone (FLOVENT HFA) 110 MCG/ACT inhaler Inhale 1 puff into the lungs 2 (two) times daily.   06/06/2021   Ibuprofen-diphenhydrAMINE HCl 200-25 MG CAPS Take 1 tablet by mouth at bedtime as needed (sleep).   Past Month   montelukast (SINGULAIR) 10 MG tablet Take 10 mg by mouth daily at 6 PM.   06/05/2021   Multiple Vitamins-Minerals (CENTRUM SILVER PO) Take 1 tablet by mouth daily.   06/06/2021    albuterol (VENTOLIN HFA) 108 (90 Base) MCG/ACT inhaler Inhale 1-2 puffs into the lungs every 6 (six) hours as needed.      dexlansoprazole (DEXILANT) 60 MG capsule Take 60 mg by mouth daily.      EPINEPHrine (EPIPEN 2-PAK) 0.3 mg/0.3 mL IJ SOAJ injection Inject 0.3 mg into the muscle as needed (allergic reaction).      Hypromellose 0.2 % SOLN Place 2 drops into both eyes as needed.       Results for orders placed or performed during the hospital encounter of 06/06/21 (from the past 48 hour(s))  CBC with Differential/Platelet     Status: Abnormal   Collection Time: 06/06/21 10:52 PM  Result Value Ref Range   WBC 9.8 4.0 - 10.5 K/uL   RBC 4.94 4.22 - 5.81 MIL/uL   Hemoglobin 15.8 13.0 - 17.0 g/dL   HCT 47.2 39.0 - 52.0 %   MCV 95.5 80.0 - 100.0 fL   MCH 32.0 26.0 - 34.0 pg   MCHC 33.5 30.0 - 36.0 g/dL   RDW 13.9 11.5 - 15.5 %   Platelets 236 150 - 400 K/uL   nRBC 0.0 0.0 - 0.2 %   Neutrophils Relative % 64 %   Neutro Abs 6.3 1.7 - 7.7 K/uL   Lymphocytes Relative 19 %   Lymphs Abs 1.9 0.7 - 4.0 K/uL  Monocytes Relative 12 %   Monocytes Absolute 1.2 (H) 0.1 - 1.0 K/uL   Eosinophils Relative 4 %   Eosinophils Absolute 0.4 0.0 - 0.5 K/uL   Basophils Relative 1 %   Basophils Absolute 0.1 0.0 - 0.1 K/uL   Immature Granulocytes 0 %   Abs Immature Granulocytes 0.03 0.00 - 0.07 K/uL    Comment: Performed at [facility] [address], [place], [place] [address]  Protime-INR     Status: None   Collection Time: 06/06/21 10:52 PM  Result Value Ref Range   Prothrombin Time 13.3 11.4 - 15.2 seconds   INR 1.0 0.8 - 1.2    Comment: (NOTE) INR goal varies based on device and disease states. Performed at [facility], [place] [address] [place], [place] [address]   APTT     Status: None   Collection Time: 06/06/21 10:52 PM  Result Value Ref Range   aPTT 30 24 - 36 seconds    Comment: Performed at [facility] [address] [place], [place] [address]  Lipid panel      Status: Abnormal   Collection Time: 06/06/21 10:52 PM  Result Value Ref Range   Cholesterol 191 0 - 200 mg/dL   Triglycerides 117 <150 mg/dL   HDL 40 (L) >40 mg/dL   Total CHOL/HDL Ratio 4.8 RATIO   VLDL 23 0 - 40 mg/dL   LDL Cholesterol 128 (H) 0 - 99 mg/dL    Comment:        Total Cholesterol/HDL:CHD Risk Coronary Heart Disease Risk Table                     Men   Women  1/2 Average Risk   3.4   3.3  Average Risk       5.0   4.4  2 X Average Risk   9.6   7.1  3 X Average Risk  23.4   11.0        Use the calculated Patient Ratio above and the CHD Risk Table to determine the patient's CHD Risk.        ATP III CLASSIFICATION (LDL):  <100     mg/dL   Optimal  100-129  mg/dL   Near or Above                    Optimal  130-159  mg/dL   Borderline  160-189  mg/dL   High  >190     mg/dL   Very High Performed at [facility] [address], [place], [place] [address]   Resp Panel by RT-PCR (Flu A&B, Covid) Nasopharyngeal Swab     Status: None   Collection Time: 06/06/21 11:11 PM   Specimen: Nasopharyngeal Swab; Nasopharyngeal(NP) swabs in vial transport medium  Result Value Ref Range   SARS Coronavirus 2 by RT PCR NEGATIVE NEGATIVE    Comment: (NOTE) SARS-CoV-2 target nucleic acids are NOT DETECTED.  The SARS-CoV-2 RNA is generally detectable in upper respiratory specimens during the acute phase of infection. The lowest concentration of SARS-CoV-2 viral copies this assay can detect is 138 copies/mL. A negative result does not preclude SARS-Cov-2 infection and should not be used as the sole basis for treatment or other patient management decisions. A negative result may occur with  improper specimen collection/handling, submission of specimen other than nasopharyngeal swab, presence of viral mutation(s) within the areas targeted by this assay, and inadequate  number of viral copies(<138 copies/mL). A negative result must be combined with clinical observations, patient  history, and epidemiological information. The expected result is Negative.  Fact Sheet for Patients:  EntrepreneurPulse.com.au  Fact Sheet for Healthcare Providers:  IncredibleEmployment.be  This test is no t yet approved or cleared by the [place] FDA and  has been authorized for detection and/or diagnosis of SARS-CoV-2 by FDA under an Emergency Use Authorization (EUA). This EUA will remain  in effect (meaning this test can be used) for the duration of the COVID-19 declaration under Section 564(b)(1) of the Act, 21 U.S.C.section 360bbb-3(b)(1), unless the authorization is terminated  or revoked sooner.       Influenza A by PCR NEGATIVE NEGATIVE   Influenza B by PCR NEGATIVE NEGATIVE    Comment: (NOTE) The Xpert Xpress SARS-CoV-2/FLU/RSV plus assay is intended as an aid in the diagnosis of influenza from Nasopharyngeal swab specimens and should not be used as a sole basis for treatment. Nasal washings and aspirates are unacceptable for Xpert Xpress SARS-CoV-2/FLU/RSV testing.  Fact Sheet for Patients: EntrepreneurPulse.com.au  Fact Sheet for Healthcare Providers: IncredibleEmployment.be  This test is not yet approved or cleared by the [place] FDA and has been authorized for detection and/or diagnosis of SARS-CoV-2 by FDA under an Emergency Use Authorization (EUA). This EUA will remain in effect (meaning this test can be used) for the duration of the COVID-19 declaration under Section 564(b)(1) of the Act, 21 U.S.C. section 360bbb-3(b)(1), unless the authorization is terminated or revoked.  Performed at [facility], [place] [address], [place], [place] [address]   Prepare RBC (crossmatch)     Status: None   Collection Time: 06/06/21 11:55 PM  Result Value Ref Range   Order Confirmation      ORDER PROCESSED BY BLOOD BANK Performed at [facility], [place] [address], [place],  [place] [address]     No results found.  Review of Systems Blood pressure 117/77, pulse 64, temperature 98 F (36.7 C), temperature source Oral, resp. rate 11, height 6' (1.829 m), weight 77.1 kg, SpO2 99 %. Physical Exam Constitutional:      General: He is not in acute distress.    Appearance: Normal appearance.  HENT:     Head: Normocephalic and atraumatic.  Eyes:     Extraocular Movements: Extraocular movements intact.  Neck:     Vascular: No carotid bruit.  Cardiovascular:     Rate and Rhythm: Normal rate and regular rhythm.     Heart sounds: No murmur heard. Pulmonary:     Comments: Clear anteriorly Neurological:     General: No focal deficit present.     Mental Status: He is alert and oriented to person, place, and time.  Limited exam as on cath table  Assessment/Plan: 80 yo man with a history of asthma and reflux who presented with a STEMI this evening. Cath shows severe left main disease. CABG indicated for survival, relief of symptoms.  I discussed the general nature of the procedure, the need for general anesthesia, the incisions to be used, the use of cardiopulmonary bypass with Mr. Lindblad.  We discussed the expected hospital stay, overall recovery and short and long term outcomes.  He understands the risks include, but are not limited to death, stroke, MI, DVT/PE, bleeding, possible need for transfusion, infections, cardiac arrhythmias, as well as other organ system dysfunction including respiratory, renal, or GI complications.   He accepts the risks and agrees to proceed.  The OR has been notified and preparations are underway  [name] 06/07/2021, 12:06 AM

## 2021-06-07 NOTE — Anesthesia Procedure Notes (Signed)
Arterial Line Insertion Start/End6/11/2021 5:33 AM, 06/07/2021 5:39 AM Performed by: Annye Asa, MD, anesthesiologist  Patient location: OR. Preanesthetic checklist: patient identified, IV checked, risks and benefits discussed, surgical consent, monitors and equipment checked, pre-op evaluation, timeout performed and anesthesia consent Left, radial was placed Catheter size: 20 G Hand hygiene performed , maximum sterile barriers used  and Seldinger technique used Allen's test indicative of satisfactory collateral circulation Attempts: 1 Procedure performed using ultrasound guided technique. Ultrasound Notes:anatomy identified, needle tip was noted to be adjacent to the nerve/plexus identified and no ultrasound evidence of intravascular and/or intraneural injection Following insertion, dressing applied and Biopatch. Post procedure assessment: normal  Patient tolerated the procedure well with no immediate complications.

## 2021-06-07 NOTE — Transfer of Care (Signed)
Immediate Anesthesia Transfer of Care Note  Patient: Chad Washington  Procedure(s) Performed: CORONARY ARTERY BYPASS GRAFTING (CABG) TIMES FOUR, ON PUMP, USING LEFT INTERNAL MAMMARY ARTERY AND ENDOSCOPICALLY HARVESTED RIGHT GREATER SAPHENOUS VEIN (Chest) TRANSESOPHAGEAL ECHOCARDIOGRAM (TEE) ENDOVEIN HARVEST OF GREATER SAPHENOUS VEIN (Right)  Patient Location: ICU  Anesthesia Type:General  Level of Consciousness: sedated and Patient remains intubated per anesthesia plan  Airway & Oxygen Therapy: Patient remains intubated per anesthesia plan and Patient placed on Ventilator (see vital sign flow sheet for setting)  Post-op Assessment: Report given to RN and Post -op Vital signs reviewed and stable  Post vital signs: Reviewed and stable  Last Vitals:  Vitals Value Taken Time  BP 152/97 06/07/21 0615  Temp 34.6 C 06/07/21 0619  Pulse 37 06/07/21 0619  Resp 0 06/07/21 0619  SpO2 100 % 06/07/21 0619  Vitals shown include unvalidated device data.  Last Pain:  Vitals:   06/07/21 0045  TempSrc:   PainSc: 5          Complications: No notable events documented.

## 2021-06-07 NOTE — Progress Notes (Signed)
  Echocardiogram Echocardiogram Transesophageal has been performed.  Chad Washington 06/07/2021, 1:46 AM

## 2021-06-07 NOTE — Progress Notes (Signed)
Advanced Heart Failure Rounding Note   Subjective:     POD 1 Emergent CABG  LIMA -LAD, SVG -OM1-OM2, SVG-Diag   Cardiac index low this am but received multiple rounds of albumin with improvement.   IABP remains in place at 1:1  on neo. No co-ox  Feels ok. Chest sore.   Swan  PA 28/15 Thermo 4.9/2.5  A paced at 90s. Minimal output from CTs      Objective:   Weight Range:  Vital Signs:   Temp:  [93.74 F (34.3 C)-100.04 F (37.8 C)] 99.68 F (37.6 C) (06/12 2000) Pulse Rate:  [38-114] 88 (06/12 2000) Resp:  [0-31] 17 (06/12 2000) BP: (108-163)/(49-108) 116/63 (06/12 2000) SpO2:  [86 %-100 %] 96 % (06/12 2000) Arterial Line BP: (100-187)/(32-98) 130/42 (06/12 2000) FiO2 (%):  [36 %-50 %] 36 % (06/12 1458) Weight:  [77.1 kg] 77.1 kg (06/12 0800) Last BM Date:  (PTA)  Weight change: Filed Weights   06/06/21 2250 06/07/21 0800  Weight: 77.1 kg 77.1 kg    Intake/Output:   Intake/Output Summary (Last 24 hours) at 06/07/2021 2023 Last data filed at 06/07/2021 2000 Gross per 24 hour  Intake 5642.38 ml  Output 3175 ml  Net 2467.38 ml     Physical Exam: General:  Lying flat in bed No resp difficulty HEENT: normal Neck: supple. RIJ swan Carotids 2+ bilat; no bruits. No lymphadenopathy or thryomegaly appreciated. Cor: PMI nondisplaced. Regular rate & rhythm. + rub Sternal dressing and CTs ok  Lungs: clear Abdomen: soft, nontender, nondistended. No hepatosplenomegaly. No bruits or masses. Hypoactive bowel sounds. Extremities: no cyanosis, clubbing, rash, tr edema RFA IABP  Neuro: alert & orientedx3, cranial nerves grossly intact. moves all 4 extremities w/o difficulty. Affect pleasant  Telemetry: A paced 90s Personally reviewed  Labs: Basic Metabolic Panel: Recent Labs  Lab 06/06/21 2252 06/06/21 2355 06/07/21 0028 06/07/21 0156 06/07/21 0224 06/07/21 0325 06/07/21 0416 06/07/21 0509 06/07/21 0517 06/07/21 0628 06/07/21 1301 06/07/21 1453  06/07/21 1640  NA 138 137   < > 141   < > 139 137   < > 141 142 140 143 143  K 3.7 3.5   < > 4.0   < > 5.2* 5.4*   < > 3.9 3.8 4.4 4.6 4.8  CL 105 105   < > 105  --  104 104  --  104  --  112*  --   --   CO2 26 23  --   --   --   --   --   --   --   --  23  --   --   GLUCOSE 109* 140*   < > 112*  --  100* 114*  --  140*  --  114*  --   --   BUN 16 13   < > 16  --  14 13  --  13  --  12  --   --   CREATININE 0.93 0.83   < > 0.70  --  0.60* 0.60*  --  0.60*  --  0.78  --   --   CALCIUM 8.9 8.8*  --   --   --   --   --   --   --   --  8.0*  --   --   MG  --   --   --   --   --   --   --   --   --   --  3.2*  --   --    < > = values in this interval not displayed.    Liver Function Tests: Recent Labs  Lab 06/06/21 2252 06/06/21 2355  AST 39 38  ALT 32 29  ALKPHOS 39 39  BILITOT 0.7 0.7  PROT 6.6 6.3*  ALBUMIN 4.0 3.5   No results for input(s): LIPASE, AMYLASE in the last 168 hours. No results for input(s): AMMONIA in the last 168 hours.  CBC: Recent Labs  Lab 06/06/21 2252 06/06/21 2355 06/07/21 0028 06/07/21 0403 06/07/21 0416 06/07/21 0628 06/07/21 0630 06/07/21 1301 06/07/21 1453 06/07/21 1640  WBC 9.8 8.1  --   --   --   --  16.0* 10.3  --   --   NEUTROABS 6.3  --   --   --   --   --   --   --   --   --   HGB 15.8 15.2   < > 10.4*   < > 11.2* 11.8* 11.0* 10.5* 10.9*  HCT 47.2 44.8   < > 31.3*   < > 33.0* 35.3* 32.6* 31.0* 32.0*  MCV 95.5 93.7  --   --   --   --  96.2 97.3  --   --   PLT 236 216  --  143*  --   --  136* 111*  --   --    < > = values in this interval not displayed.    Cardiac Enzymes: No results for input(s): CKTOTAL, CKMB, CKMBINDEX, TROPONINI in the last 168 hours.  BNP: BNP (last 3 results) No results for input(s): BNP in the last 8760 hours.  ProBNP (last 3 results) No results for input(s): PROBNP in the last 8760 hours.    Other results:  Imaging: CARDIAC CATHETERIZATION  Result Date: 06/07/2021  Ramus lesion is 85% stenosed.   Dist LM to Ost LAD lesion is 95% stenosed.  Ost Cx lesion is 95% stenosed.  Prox LAD lesion is 95% stenosed.  1st Diag lesion is 70% stenosed.  Prox RCA lesion is 30% stenosed.  RPDA lesion is 50% stenosed.  Severe 95% distal left main stenosis percent proximal LAD stenosis, 70% diffuse first diagonal stenosis, 95% ostial left circumflex stenosis.  The RCA has mild luminal irregularity with narrowings of 20 and 30%.  There is a 50% ostial narrowing and a small PDA vessel. Acute LV dysfunction with EF estimate approximately 50% with mild distal anterior lateral/apical hypocontractility.  LVEDP 23 mmHg. Insertion of intra-aortic balloon pump for hemodynamic support prior to emergent CaBG surgery this evening. RECOMMENDATION: The patient will be taken to the operating room by Dr. Modesto Charon emergently for critical left main stenosis with subtotal LAD and ostial circumflex stenoses.   DG Chest Port 1 View  Result Date: 06/07/2021 CLINICAL DATA:  Follow-up after CABG EXAM: PORTABLE CHEST 1 VIEW COMPARISON:  08/25/2015 FINDINGS: Endotracheal tube with tip at the clavicular heads. The enteric tube reaches the stomach. Chest drains in place. Swan-Ganz catheter from the right with tip directed at the right main artery. Aortic balloon pump marker is approximately 5 cm below the aortic knob. Low volume chest with mild interstitial prominence. Small left apical pneumothorax. Heart is distorted by rotation. No cardiomegaly. IMPRESSION: 1. Hardware as described. The aortic balloon pump marker is roughly 5 cm below the knob. 2. Small left apical pneumothorax.  Vascular congestion. Electronically Signed   By: Monte Fantasia M.D.   On: 06/07/2021 07:05   ECHO  INTRAOPERATIVE TEE  Result Date: 06/07/2021  *INTRAOPERATIVE TRANSESOPHAGEAL REPORT *  Patient Name:   KAMREN HEINTZELMAN Date of Exam: 06/07/2021 Medical Rec #:  [id number]           Height:       72.0 in Accession #:    [id number]          Weight:        170.0 lb Date of Birth:  05/30/1941           BSA:          1.99 m Patient Age:    89 years            BP:           129/70 mmHg Patient Gender: M                   HR:           96 bpm. Exam Location:  Inpatient Transesophogeal exam was perform intraoperatively during surgical procedure. Patient was closely monitored under general anesthesia during the entirety of examination. Indications:     CABG Performing Phys: Bonneau HENDRICKSON Diagnosing Phys: Annye Asa MD Complications: No known complications during this procedure. POST-OP IMPRESSIONS Limited Post-CPB exam: The patient separated easily from CPB, requiring minimal Phenylephrine support, with IABP resumed. DDD pacing was initially required, however the patient resumed NSR and pacing was paused.  - Left Ventricle: The left ventricular function is essentially unchanged from pre-bypass images. The anterior and septal regions are mildly hypokinetic, improving with over time off of CPB. The overall EF appears 45-50%, measured 44%. - Right Ventricle: The right ventricular function appears unchanged from pre-bypass images. Mild hypokinesis improved to normal with time off of CPB. - Aorta: The aorta appears unchanged from pre-bypass. IABP is present in the thoracic aorta. - Aortic Valve: The aortic valve function appears unchanged from pre-bypass images. There is no AI. - Mitral Valve: The mitral valve initially showed tethering of the posterior leaflet, with moderate-severe MR on separation from CPB. This showed continutal improvement over time. Once NSR resumed, and pacing was no longer necessary, the mitral valve functioned normally, with only trace MR. The final images of the mitral valve appear unchanged from pre-bypass images. - Tricuspid Valve: The tricuspid valve function appears unchanged from pre-bypass images. Mild TR was present initially on separation from CPB, yet improved and was no longer evident at the end of surgery. PRE-OP FINDINGS   Left Ventricle: The left ventricle has low normal systolic function, with an ejection fraction of 50-55%, measured 54.8%. The anterior wall is mildly hypokinetic. The cavity size was normal. There is moderate concentric left ventricular hypertrophy. Left ventricular diastolic function was not evaluated. Right Ventricle: The right ventricle has normal systolic function. The cavity was normal. There is no increase in right ventricular wall thickness. Catheter present in the right ventricle. Left Atrium: Left atrial size was normal in size. No left atrial/left atrial appendage thrombus was detected. Left atrial appendage velocity is normal at greater than 40 cm/s. Right Atrium: Right atrial size was normal in size. Catheter present in the right atrium. Interatrial Septum: No atrial level shunt detected by color flow Doppler. There is no evidence of a patent foramen ovale. Pericardium: There is no evidence of pericardial effusion. Mitral Valve: The mitral valve is normal in structure and function. Mitral valve regurgitation is trivial by color flow Doppler. There is no evidence of mitral valve vegetation. There is no evidence  of mitral stenosis, with peak gradient 2 mmHg, mean gradient 1 mmHg. Tricuspid Valve: The tricuspid valve was normal in structure. Tricuspid valve regurgitation was not visualized by color flow Doppler. No evidence of tricuspid stenosis is present. There is no evidence of tricuspid valve vegetation. Aortic Valve: The aortic valve is tricuspid. Aortic valve regurgitation was not visualized by color flow Doppler. There is no stenosis of the aortic valve, with peak gradient 6 mmHg, mean gradient 3 mmHg. There is no evidence of aortic valve vegetation. Pulmonic Valve: The pulmonic valve was normal in structure, with normal leaflet excursion. No evidence of pulmonic stenosis. Pulmonic valve regurgitation is trivial, around the PA catheter, by color flow Doppler. Aorta: The aortic root, ascending aorta  and aortic arch are normal in size and structure. There is an IABP present in the thoracic aorta. Pulmonary Artery: Gordy Councilman catheter present on the right. The pulmonary artery is of normal size. Venous: The inferior vena cava was not well visualized. The inferior vena cava appeared normal in size with greater than 50% respiratory variability, suggesting right atrial pressure of 3 mmHg. Shunts: There is no evidence of an atrial septal defect. +-------------+--------++ AORTIC VALVE          +-------------+--------++ AV Mean Grad:3.0 mmHg +-------------+--------++ +-------------+--------++ MITRAL VALVE          +-------------+--------++ MV Mean grad:1.0 mmHg +-------------+--------++  Annye Asa MD Electronically signed by Annye Asa MD Signature Date/Time: 06/07/2021/6:17:00 AM    Final      Medications:     Scheduled Medications:  [START ON 06/08/2021] acetaminophen  1,000 mg Oral Q6H   Or   [START ON 06/08/2021] acetaminophen (TYLENOL) oral liquid 160 mg/5 mL  1,000 mg Per Tube Q6H   [START ON 06/08/2021] aspirin EC  325 mg Oral Daily   Or   [START ON 06/08/2021] aspirin  324 mg Per Tube Daily   [START ON 06/08/2021] bisacodyl  10 mg Oral Daily   Or   [START ON 06/08/2021] bisacodyl  10 mg Rectal Daily   Chlorhexidine Gluconate Cloth  6 each Topical Daily   [START ON 06/08/2021] docusate sodium  200 mg Oral Daily   ketotifen  1 drop Both Eyes BID   mouth rinse  15 mL Mouth Rinse BID   metoprolol tartrate  12.5 mg Oral BID   Or   metoprolol tartrate  12.5 mg Per Tube BID   [START ON 06/09/2021] pantoprazole  40 mg Oral Daily   sodium chloride flush  10-40 mL Intracatheter Q12H   [START ON 06/08/2021] sodium chloride flush  3 mL Intravenous Q12H    Infusions:  sodium chloride 20 mL/hr at 06/07/21 2000   [START ON 06/08/2021] sodium chloride 250 mL (06/07/21 1120)   sodium chloride 1 mL/hr at 06/07/21 0707    ceFAZolin (ANCEF) IV Stopped (06/07/21 1047)    dexmedetomidine (PRECEDEX) IV infusion Stopped (06/07/21 1503)   famotidine (PEPCID) IV Stopped (06/07/21 1128)   insulin 0.3 Units/hr (06/07/21 2000)   lactated ringers 20 mL/hr at 06/07/21 1032   lactated ringers 20 mL/hr at 06/07/21 2000   nitroGLYCERIN     phenylephrine (NEO-SYNEPHRINE) Adult infusion 12 mcg/min (06/07/21 2000)    PRN Medications: sodium chloride, dextrose, metoprolol tartrate, midazolam, morphine injection, ondansetron (ZOFRAN) IV, oxyCODONE, sodium chloride flush, [START ON 06/08/2021] sodium chloride flush, traMADol   Assessment/Plan:   CAD with Anterior Stemi - cath with 95% LM, LAD and LCX - s/p emergent CABG with IABP support 6/11 (LIMA -LAD, SVG -  OM1-OM2, SVG-Diag) - intra-op TEE 45-50% - immediate post-op hemodynamics with low output. now making index. Swan #s ok  - will check co-ox and adjust inotropes as needed - Plan to pull IABP in am per TCTS - Team C to manage unless we are needed for shock  - Continue ASA/statin. Add pla vix prior to d/c   CRITICAL CARE Performed by: Glori Bickers  Total critical care time: 35 minutes  Critical care time was exclusive of separately billable procedures and treating other patients.  Critical care was necessary to treat or prevent imminent or life-threatening deterioration.  Critical care was time spent personally by me (independent of midlevel providers or residents) on the following activities: development of treatment plan with patient and/or surrogate as well as nursing, discussions with consultants, evaluation of patient's response to treatment, examination of patient, obtaining history from patient or surrogate, ordering and performing treatments and interventions, ordering and review of laboratory studies, ordering and review of radiographic studies, pulse oximetry and re-evaluation of patient's condition.   Length of Stay: 0   Glori Bickers MD 06/07/2021, 8:23 PM  Advanced Heart Failure  Team Pager [phone] (M-F; 7a - 4p)  Please contact [facility] for night-coverage after hours (4p -7a ) and weekends on [email or web address]

## 2021-06-07 NOTE — Anesthesia Preprocedure Evaluation (Addendum)
Anesthesia Evaluation  Patient identified by MRN, date of birth, ID band Patient awake    Reviewed: Allergy & Precautions, NPO status , Patient's Chart, lab work & pertinent test results  History of Anesthesia Complications Negative for: history of anesthetic complications  Airway Mallampati: II  TM Distance: >3 FB Neck ROM: Full    Dental  (+) Caps, Dental Advisory Given   Pulmonary COPD,  COPD inhaler,    breath sounds clear to auscultation       Cardiovascular + angina at rest + CAD (LM)   Rhythm:Regular Rate:Normal  Chest pain this am, then again this evening, emergently to cath lab: 99% LM, anterior hypokinesis IABP placed   Neuro/Psych negative neurological ROS  negative psych ROS   GI/Hepatic Neg liver ROS, GERD  Controlled and Medicated,  Endo/Other  negative endocrine ROS  Renal/GU negative Renal ROS     Musculoskeletal   Abdominal   Peds  Hematology Hb 15.8, plt 236k Heparin in cath lab   Anesthesia Other Findings   Reproductive/Obstetrics                            Anesthesia Physical Anesthesia Plan  ASA: 4 and emergent  Anesthesia Plan: General   Post-op Pain Management:    Induction: Intravenous  PONV Risk Score and Plan: 2 and Treatment may vary due to age or medical condition  Airway Management Planned: Oral ETT  Additional Equipment: Arterial line, PA Cath, TEE and Ultrasound Guidance Line Placement  Intra-op Plan:   Post-operative Plan: Post-operative intubation/ventilation  Informed Consent: I have reviewed the patients History and Physical, chart, labs and discussed the procedure including the risks, benefits and alternatives for the proposed anesthesia with the patient or authorized representative who has indicated his/her understanding and acceptance.     Dental advisory given  Plan Discussed with: CRNA and Surgeon  Anesthesia Plan Comments:        Anesthesia Quick Evaluation

## 2021-06-07 NOTE — Progress Notes (Signed)
Called in to remove right radial sheath.  6 french slender sheath removed from right radial artery and TR band placed.  14cc of air used to achieve hemostasis.  RN will removed per protocol.  Site looks good with no hematoma.

## 2021-06-08 ENCOUNTER — Inpatient Hospital Stay (HOSPITAL_COMMUNITY): Payer: Medicare PPO

## 2021-06-08 ENCOUNTER — Encounter (HOSPITAL_COMMUNITY): Payer: Self-pay | Admitting: Cardiovascular Disease

## 2021-06-08 DIAGNOSIS — Z951 Presence of aortocoronary bypass graft: Secondary | ICD-10-CM

## 2021-06-08 LAB — CBC
HCT: 33.5 % — ABNORMAL LOW (ref 39.0–52.0)
HCT: 34.8 % — ABNORMAL LOW (ref 39.0–52.0)
Hemoglobin: 11.1 g/dL — ABNORMAL LOW (ref 13.0–17.0)
Hemoglobin: 11.6 g/dL — ABNORMAL LOW (ref 13.0–17.0)
MCH: 32.4 pg (ref 26.0–34.0)
MCH: 32.8 pg (ref 26.0–34.0)
MCHC: 33.1 g/dL (ref 30.0–36.0)
MCHC: 33.3 g/dL (ref 30.0–36.0)
MCV: 97.7 fL (ref 80.0–100.0)
MCV: 98.3 fL (ref 80.0–100.0)
Platelets: 107 10*3/uL — ABNORMAL LOW (ref 150–400)
Platelets: 99 10*3/uL — ABNORMAL LOW (ref 150–400)
RBC: 3.43 MIL/uL — ABNORMAL LOW (ref 4.22–5.81)
RBC: 3.54 MIL/uL — ABNORMAL LOW (ref 4.22–5.81)
RDW: 15 % (ref 11.5–15.5)
RDW: 15 % (ref 11.5–15.5)
WBC: 12.2 10*3/uL — ABNORMAL HIGH (ref 4.0–10.5)
WBC: 13.5 10*3/uL — ABNORMAL HIGH (ref 4.0–10.5)
nRBC: 0 % (ref 0.0–0.2)
nRBC: 0 % (ref 0.0–0.2)

## 2021-06-08 LAB — BASIC METABOLIC PANEL
Anion gap: 6 (ref 5–15)
Anion gap: 9 (ref 5–15)
BUN: 13 mg/dL (ref 8–23)
BUN: 16 mg/dL (ref 8–23)
CO2: 23 mmol/L (ref 22–32)
CO2: 26 mmol/L (ref 22–32)
Calcium: 8 mg/dL — ABNORMAL LOW (ref 8.9–10.3)
Calcium: 8.5 mg/dL — ABNORMAL LOW (ref 8.9–10.3)
Chloride: 110 mmol/L (ref 98–111)
Chloride: 105 mmol/L (ref 98–111)
Creatinine, Ser: 0.76 mg/dL (ref 0.61–1.24)
Creatinine, Ser: 0.93 mg/dL (ref 0.61–1.24)
GFR, Estimated: >60 mL/min (ref >60)
GFR, Estimated: >60 mL/min (ref >60)
Glucose, Bld: 134 mg/dL — ABNORMAL HIGH (ref 70–99)
Glucose, Bld: 139 mg/dL — ABNORMAL HIGH (ref 70–99)
Potassium: 4.3 mmol/L (ref 3.5–5.1)
Potassium: 4.1 mmol/L (ref 3.5–5.1)
Sodium: 139 mmol/L (ref 135–145)
Sodium: 140 mmol/L (ref 135–145)

## 2021-06-08 LAB — GLUCOSE, CAPILLARY
Glucose-Capillary: 111 mg/dL — ABNORMAL HIGH (ref 70–99)
Glucose-Capillary: 122 mg/dL — ABNORMAL HIGH (ref 70–99)
Glucose-Capillary: 124 mg/dL — ABNORMAL HIGH (ref 70–99)
Glucose-Capillary: 131 mg/dL — ABNORMAL HIGH (ref 70–99)
Glucose-Capillary: 135 mg/dL — ABNORMAL HIGH (ref 70–99)
Glucose-Capillary: 137 mg/dL — ABNORMAL HIGH (ref 70–99)
Glucose-Capillary: 144 mg/dL — ABNORMAL HIGH (ref 70–99)
Glucose-Capillary: 145 mg/dL — ABNORMAL HIGH (ref 70–99)

## 2021-06-08 LAB — HEMOGLOBIN A1C
Hgb A1c MFr Bld: 5 % (ref 4.8–5.6)
Hgb A1c MFr Bld: 5.3 % (ref 4.8–5.6)
Hgb A1c MFr Bld: 5.3 % (ref 4.8–5.6)
Mean Plasma Glucose: 105 mg/dL
Mean Plasma Glucose: 105 mg/dL
Mean Plasma Glucose: 97 mg/dL

## 2021-06-08 LAB — MAGNESIUM
Magnesium: 2.8 mg/dL — ABNORMAL HIGH (ref 1.7–2.4)
Magnesium: 2.6 mg/dL — ABNORMAL HIGH (ref 1.7–2.4)

## 2021-06-08 MED ORDER — GUAIFENESIN ER 600 MG PO TB12
600.0000 mg | ORAL_TABLET | Freq: Two times a day (BID) | ORAL | Status: AC
  Administered 2021-06-08 – 2021-06-12 (×10): 600 mg via ORAL
  Filled 2021-06-08 (×10): qty 1

## 2021-06-08 MED ORDER — FUROSEMIDE 10 MG/ML IJ SOLN
20.0000 mg | Freq: Two times a day (BID) | INTRAMUSCULAR | Status: DC
  Administered 2021-06-08 (×2): 20 mg via INTRAVENOUS
  Filled 2021-06-08 (×2): qty 2

## 2021-06-08 MED ORDER — ENOXAPARIN SODIUM 40 MG/0.4ML IJ SOSY
40.0000 mg | PREFILLED_SYRINGE | Freq: Every day | INTRAMUSCULAR | Status: DC
  Administered 2021-06-08 – 2021-06-09 (×2): 40 mg via SUBCUTANEOUS
  Filled 2021-06-08 (×2): qty 0.4

## 2021-06-08 MED ORDER — ATORVASTATIN CALCIUM 40 MG PO TABS
40.0000 mg | ORAL_TABLET | Freq: Every day | ORAL | Status: DC
  Administered 2021-06-08 – 2021-06-15 (×8): 40 mg via ORAL
  Filled 2021-06-08 (×8): qty 1

## 2021-06-08 MED ORDER — INSULIN ASPART 100 UNIT/ML IJ SOLN: 0.0000 [IU] | Freq: Every day | INTRAMUSCULAR | Status: DC

## 2021-06-08 MED ORDER — INSULIN ASPART 100 UNIT/ML IJ SOLN
0.0000 [IU] | Freq: Three times a day (TID) | INTRAMUSCULAR | Status: DC
  Administered 2021-06-08 – 2021-06-09 (×3): 2 [IU] via SUBCUTANEOUS

## 2021-06-08 MED ORDER — INSULIN ASPART 100 UNIT/ML IJ SOLN: 0.0000 [IU] | INTRAMUSCULAR | Status: DC

## 2021-06-08 MED FILL — Verapamil HCl IV Soln 2.5 MG/ML: INTRAVENOUS | Qty: 2 | Status: AC

## 2021-06-08 MED FILL — Nitroglycerin IV Soln 100 MCG/ML in D5W: INTRA_ARTERIAL | Qty: 10 | Status: AC

## 2021-06-08 MED FILL — Heparin Sodium (Porcine) Inj 1000 Unit/ML: INTRAMUSCULAR | Qty: 10 | Status: AC

## 2021-06-08 NOTE — Discharge Summary (Signed)
[address]       [place] [address]             [phone]    Physician Discharge Summary  Patient ID: Chad Washington MRN: [id number] DOB/AGE: 05/27/1941 80 y.o.  Admit date: 06/06/2021 Discharge date: 06/15/2021  Admission Diagnoses:  Patient Active Problem List   Diagnosis Date Noted   Status post surgery 06/07/2021   Acute ST elevation myocardial infarction (STEMI) of anterior wall ([facility]) 06/07/2021   Discharge Diagnoses:  Patient Active Problem List   Diagnosis Date Noted   Acute on chronic combined systolic and diastolic CHF (congestive heart failure) (H[facility])    Left main coronary artery disease    Abnormal echocardiogram    Ischemic cardiomyopathy    Status post surgery 06/07/2021   Acute ST elevation myocardial infarction (STEMI) of anterior wall ([facility]) 06/07/2021   S/P CABG x 4 06/07/2021   Discharged Condition: good  History of present illness:  Patient is a 80 year old male with no previous history of CAD who presented with chest pain and findings of STEMI on ECG.  He was taken emergently to the cardiac catheterization lab by Dr. [name] where catheterization showed severe distal left main disease and proximal circumflex and LAD disease.  An intra-aortic balloon pump was placed and he remained hemodynamically stable.  Urgent cardiothoracic surgical consultation was obtained with Dr. Roxan Hockey who recommended proceeding to the operating room emergently for revascularization.  Hospital course:  The patient was admitted urgently through the emergency department to the catheterization lab and then to the operating room.  He underwent CABG x4 with a left internal mammary artery to LAD, saphenous vein graft to diagonal and sequential saphenous vein graft to obtuse marginals #1 and 2.  He tolerated procedure well was taken to the surgical intensive care unit in stable condition.  Postoperative hospital course:  Patient has progressed  well.  Has maintained stable hemodynamics.  Swan-Ganz and arterial lines were removed on postoperative day #1.  He has been started on aspirin, beta-blocker and statin.  He does have a expected postoperative volume overload and is responding well to diuretics.  Renal function has remained normal.  He does have an expected acute blood loss anemia which is mild.  Hemoglobin hematocrit on 06/08/2021 are 11.1 and 33.5 respectively.  Chest tubes were removed on postoperative day 1.  His blood sugars have been under good control using standard protocols.  His diet has been advanced.  He is being mobilized in a routine manner using cardiac surgery rehab modalities.  The patient was tachycardic and hypertensive.  His lopressor dose was titrated accordingly.  He had some impulsive behavior which resolved and he was alert and oriented.  He was maintaining NSR and was transferred to the progressive care unit on 06/09/2021.  The patient remained tachycardic and hypertensive.  His lopressor dose was further increased and he was initiated on low dose Cozaar.  CXR showed a small left pleural effusion.  He is being diuresed for mild volume over load state.  He remained tachycardic despite titration of Lopressor.  EKG was obtained and showed ST segment elevation in the anterior leads.  Cardiology consult was obtained and obtained an Echocardiogram which showed a reduction in his EF and worsening motion of his anterior wall.  There was concern there could be early failure of his bypass grafts and cardiac catheterization was recommended.  This was performed on 6/17 and showed full patency of his  coronary bypass grafts.  They recommended further titration of his BB to 100 mg BID.  He remains in Sinus Tachycardia.  His pacing wires have been removed without difficulty.  He was started on a gentle regimen of Lasix, potassium.  His surgical incisions are healing without evidence of infection.  He is medically stable for discharge home  today.  Significant Diagnostic Studies: angiography:    Ramus lesion is 85% stenosed. Dist LM to Ost LAD lesion is 95% stenosed. Ost Cx lesion is 95% stenosed. Prox LAD lesion is 95% stenosed. 1st Diag lesion is 70% stenosed. Prox RCA lesion is 30% stenosed. RPDA lesion is 50% stenosed.   Severe 95% distal left main stenosis percent proximal LAD stenosis, 70% diffuse first diagonal stenosis, 95% ostial left circumflex stenosis.  The RCA has mild luminal irregularity with narrowings of 20 and 30%.  There is a 50% ostial narrowing and a small PDA vessel.   Acute LV dysfunction with EF estimate approximately 50% with mild distal anterior lateral/apical hypocontractility.  LVEDP 23 mmHg.   Insertion of intra-aortic balloon pump for hemodynamic support prior to emergent CaBG surgery this evening.   Echocardiogram:  1. Left ventricular ejection fraction, by estimation, is 35 to 40%. The  left ventricle has moderately decreased function. The left ventricle  demonstrates regional wall motion abnormalities (see scoring  diagram/findings for description). There is  akinesis of the left ventricular, apical segment. There is akinesis of the  left ventricular, apical septal wall, lateral wall, anterior wall and  inferolateral wall. There is hypokinesis of the left ventricular,  basal-mid anterior wall.   2. Right ventricular systolic function is normal. The right ventricular  size is normal.   3. The mitral valve is normal in structure. No evidence of mitral valve  regurgitation. No evidence of mitral stenosis.   4. The aortic valve is normal in structure. Aortic valve regurgitation is  not visualized. No aortic stenosis is present.   5. The inferior vena cava is normal in size with greater than 50%  respiratory variability, suggesting right atrial pressure of 3 mmHg.   Treatments: surgery:   Operative Report   DATE OF PROCEDURE: 06/07/2021     PREOPERATIVE DIAGNOSIS:  Left main and  severe 2-vessel coronary artery disease with ST elevation myocardial infarction.   POSTOPERATIVE DIAGNOSIS:  Left main and severe 2-vessel coronary artery disease with ST elevation myocardial infarction.   PROCEDURES PERFORMED:  Median sternotomy, extracorporeal circulation, coronary artery bypass grafting x4, left internal mammary artery to LAD, saphenous vein graft to diagonal and sequential saphenous vein graft to obtuse marginals 1 and 2.   SURGEON:  Revonda Standard. Roxan Hockey, MD.   ASSISTANTJadene Pierini.  Discharge Exam: Blood pressure 123/78, pulse 92, temperature 98.2 F (36.8 C), temperature source Oral, resp. rate 20, height 6' (1.829 m), weight 77.8 kg, SpO2 97 %. General appearance: alert, cooperative, and no distress Heart: regular rate and rhythm Lungs: clear to auscultation bilaterally Abdomen: soft, non-tender; bowel sounds normal; no masses,  no organomegaly Extremities: edema trace Wound: clean and dry  Discharge Medications:  The patient has been discharged on:   1.Beta Blocker:  Yes [ X  ]                              No   [   ]  If No, reason:  2.Ace Inhibitor/ARB: Yes [ X  ]                                     No  [    ]                                     If No, reason:  3.Statin:   Yes [  X ]                  No  [   ]                  If No, reason:  4.Ecasa:  Yes  [  X ]                  No   [   ]                  If No, reason:   Allergies as of 06/15/2021       Reactions   Latex Rash        Medication List     STOP taking these medications    Ibuprofen-diphenhydrAMINE HCl 200-25 MG Caps   saw palmetto 500 MG capsule       TAKE these medications    albuterol 108 (90 Base) MCG/ACT inhaler Commonly known as: VENTOLIN HFA Inhale 1-2 puffs into the lungs every 6 (six) hours as needed.   aspirin 325 MG EC tablet Take 1 tablet (325 mg total) by mouth daily.   atorvastatin 40 MG tablet Commonly known  as: LIPITOR Take 1 tablet (40 mg total) by mouth daily.   azelastine 0.05 % ophthalmic solution Commonly known as: OPTIVAR Place 1 drop into both eyes 2 (two) times daily.   CENTRUM SILVER PO Take 1 tablet by mouth daily.   cetirizine 10 MG tablet Commonly known as: ZYRTEC Take 10 mg by mouth daily at 6 PM.   dexlansoprazole 60 MG capsule Commonly known as: DEXILANT Take 60 mg by mouth daily.   EPINEPHrine 0.3 mg/0.3 mL Soaj injection Commonly known as: EPI-PEN Inject 0.3 mg into the muscle as needed (allergic reaction).   fluticasone 110 MCG/ACT inhaler Commonly known as: FLOVENT HFA Inhale 1 puff into the lungs 2 (two) times daily.   furosemide 20 MG tablet Commonly known as: LASIX Take 1 tablet (20 mg total) by mouth daily.   guaiFENesin 600 MG 12 hr tablet Commonly known as: MUCINEX Take 1 tablet (600 mg total) by mouth 2 (two) times daily as needed.   losartan 25 MG tablet Commonly known as: COZAAR Take 1 tablet (25 mg total) by mouth daily.   metoprolol tartrate 100 MG tablet Commonly known as: LOPRESSOR Take 1 tablet (100 mg total) by mouth 2 (two) times daily.   montelukast 10 MG tablet Commonly known as: SINGULAIR Take 10 mg by mouth daily at 6 PM.   potassium chloride 10 MEQ tablet Commonly known as: KLOR-CON Take 1 tablet (10 mEq total) by mouth daily.        Follow-up Information     Deberah Pelton, NP Follow up on 07/02/2021.   Specialty: Cardiology Why: Appointment is at 3:15 Contact information: [address] [place] [address] [phone]  [name], MD Follow up on 07/07/2021.   Specialty: Cardiothoracic Surgery Why: Appointment is at 10:45, please get CXR at 10:15 at [facility] located on first floor of our office building Contact information: [address] [place] [address] [phone]                 Signed: Ellwood Handler, PA-C 06/15/2021, 7:26 AM

## 2021-06-08 NOTE — Progress Notes (Signed)
TCTS Evening Rounds POD #2 s/p CABG Alert/responsive but mildly confused BP 128/80   Pulse (!) 101   Temp 98.2 F (36.8 C) (Oral)   Resp (!) 29   Ht 6' (1.829 m)   Wt 84.6 kg   SpO2 94%   BMI 25.30 kg/m  Tachycardiac CTA C/d/I  Labs reviewed--ok this pm  A/p: consider amiodarone but will wait to see if full-blown afib. Chad Washington Z. Orvan Seen, [name]

## 2021-06-08 NOTE — Progress Notes (Signed)
IABP aspirated and removed from RFA, manual pressure applied for 30 minutes. Site level 1 due to bruising present prior to removal.   Tegaderm dressing applied, bedrest instructions given.   Bilateral dp and pt pulses easily palpable.  Bedrest begins at 09:30:00

## 2021-06-08 NOTE — Progress Notes (Signed)
      [address]       [place] [address]             [phone]        Paged to the bedside this morning for confusion/change in mental status. Nursing noted that about 45 minutes after the balloon pump was pulled, the patient seemed like he had received sedation and his processing speed was slower than normal. Neuo exam was normal at that time. Patient is alert to person, place, and time. I arrived at the bedside a few minutes after the call. He was Alert and Oriented x 3. Moving all extremities appropriately and equal strength in upper and lower ext. No facial droop. I checked his balloon pump site and there was no hematoma present. Small amount of ecchymosis below the dressing. While conversing with the patient his processing speed did appear to be a little slower but this was my first time meeting the patient so I'm unaware of his baseline. He responded appropriately to my questions and wanted to call his family to update them on his status. He does have some low back pain which is new per the patient. No abdominal pain, no hematuria, and no positive symptoms/sign of stroke.   Plan: Continue to monitor closely. If there are any other changes, please page myself or Dr. Roxan Hockey.   Nicholes Rough, PA-C

## 2021-06-08 NOTE — Progress Notes (Signed)
2 Days Post-Op Procedure(s) (LRB): CORONARY ARTERY BYPASS GRAFTING (CABG) TIMES FOUR, ON PUMP, USING LEFT INTERNAL MAMMARY ARTERY AND ENDOSCOPICALLY HARVESTED RIGHT GREATER SAPHENOUS VEIN (N/A) TRANSESOPHAGEAL ECHOCARDIOGRAM (TEE) ENDOVEIN HARVEST OF GREATER SAPHENOUS VEIN (Right) Subjective: Some discomfort  Objective: Vital signs in last 24 hours: Temp:  [94.46 F (34.7 C)-100.22 F (37.9 C)] 99.86 F (37.7 C) (06/13 0700) Pulse Rate:  [38-109] 79 (06/13 0300) Cardiac Rhythm: Atrial paced (06/12 2000) Resp:  [0-27] 19 (06/13 0700) BP: (108-156)/(62-98) 156/68 (06/13 0600) SpO2:  [91 %-100 %] 92 % (06/13 0700) Arterial Line BP: (107-180)/(32-77) 107/69 (06/13 0700) FiO2 (%):  [36 %-50 %] 36 % (06/12 1458) Weight:  [77.1 kg-84.6 kg] 84.6 kg (06/13 0530)  Hemodynamic parameters for last 24 hours: PAP: (21-38)/(11-24) 36/16 CO:  [2.6 L/min-5.4 L/min] 5.4 L/min CI:  [1.3 L/min/m2-2.7 L/min/m2] 2.7 L/min/m2  Intake/Output from previous day: 06/12 0701 - 06/13 0700 In: 3610.5 [P.O.:400; I.V.:1581.5; IV Piggyback:1629] Out: 2155 [Urine:1695; Emesis/NG output:150; Chest Tube:310] Intake/Output this shift: No intake/output data recorded.  General appearance: alert, cooperative, and no distress Neurologic: intact Heart: regular rate and rhythm Lungs: mild exp wheeze Abdomen: normal findings: soft, non-tender Extremities: well perfused  Lab Results: Recent Labs    06/07/21 1301 06/07/21 1453 06/07/21 1640 06/08/21 0333  WBC 10.3  --   --  12.2*  HGB 11.0*   < > 10.9* 11.1*  HCT 32.6*   < > 32.0* 33.5*  PLT 111*  --   --  107*   < > = values in this interval not displayed.   BMET:  Recent Labs    06/07/21 1301 06/07/21 1453 06/07/21 1640 06/08/21 0333  NA 140   < > 143 139  K 4.4   < > 4.8 4.3  CL 112*  --   --  110  CO2 23  --   --  23  GLUCOSE 114*  --   --  134*  BUN 12  --   --  13  CREATININE 0.78  --   --  0.76  CALCIUM 8.0*  --   --  8.0*   < > =  values in this interval not displayed.    PT/INR:  Recent Labs    06/07/21 0630  LABPROT 17.6*  INR 1.5*   ABG    Component Value Date/Time   PHART 7.292 (L) 06/07/2021 1640   HCO3 22.6 06/07/2021 1640   TCO2 24 06/07/2021 1640   ACIDBASEDEF 4.0 (H) 06/07/2021 1640   O2SAT 71.3 06/07/2021 2006   CBG (last 3)  Recent Labs    06/08/21 0335 06/08/21 0527 06/08/21 0726  GLUCAP 137* 131* 111*    Assessment/Plan: S/P Procedure(s) (LRB): CORONARY ARTERY BYPASS GRAFTING (CABG) TIMES FOUR, ON PUMP, USING LEFT INTERNAL MAMMARY ARTERY AND ENDOSCOPICALLY HARVESTED RIGHT GREATER SAPHENOUS VEIN (N/A) TRANSESOPHAGEAL ECHOCARDIOGRAM (TEE) ENDOVEIN HARVEST OF GREATER SAPHENOUS VEIN (Right) POD # 1 CV- hemodynamics stable  Dc Swan, A line  ASA, beta blocker, statin RESP-IS for atelectasis, some secretions- mucinex RENAL- creatinine normal and lytes oK  Weight up - diurese ENDO- CBG well controlled, transition to SSi Gi- diet as tolerated Anemia- Hgb stable Dc chest tubes mobilize  LOS: 1 day    Chad Washington 06/08/2021

## 2021-06-08 NOTE — Progress Notes (Signed)
Went in to do 78min groin site check after removal of IABP by cath lab.  Patient appeared to be confused and disoriented.  I reoriented him and he was able to repeat it to me several minutes later.  Remains forgetful and is minimally confused.  He is complaining of lower back pain as well, which is new for him.  Site level 1 no hematoma, no tenderness at abdomen and is soft. Paged Nicholes Rough PA and she came to bedside to assess the patient.

## 2021-06-08 NOTE — Hospital Course (Addendum)
History of present illness:  Patient is a 80 year old male with no previous history of CAD who presented with chest pain and findings of STEMI on ECG.  He was taken emergently to the cardiac catheterization lab by Dr. [name] where catheterization showed severe distal left main disease and proximal circumflex and LAD disease.  An intra-aortic balloon pump was placed and he remained hemodynamically stable.  Urgent cardiothoracic surgical consultation was obtained with Dr. Roxan Hockey who recommended proceeding to the operating room emergently for revascularization.  Hospital course:  The patient was admitted urgently through the emergency department to the catheterization lab and then to the operating room.  He underwent CABG x4 with a left internal mammary artery to LAD, saphenous vein graft to diagonal and sequential saphenous vein graft to obtuse marginals #1 and 2.  He tolerated procedure well was taken to the surgical intensive care unit in stable condition.  Postoperative hospital course:  Patient has progressed well.  Has maintained stable hemodynamics.  Swan-Ganz and arterial lines were removed on postoperative day #1.  He has been started on aspirin, beta-blocker and statin.  He does have a expected postoperative volume overload and is responding well to diuretics.  Renal function has remained normal.  He does have an expected acute blood loss anemia which is mild.  Hemoglobin hematocrit on 06/08/2021 are 11.1 and 33.5 respectively.  Chest tubes were removed on postoperative day 1.  His blood sugars have been under good control using standard protocols.  His diet has been advanced.  He is being mobilized in a routine manner using cardiac surgery rehab modalities.  The patient was tachycardic and hypertensive.  His lopressor dose was titrated accordingly.  He had some impulsive behavior which resolved and he was alert and oriented.  He was maintaining NSR and was transferred to the progressive  care unit on 06/09/2021.  The patient remained tachycardic and hypertensive.  His lopressor dose was further increased and he was initiated on low dose Cozaar.  CXR showed a small left pleural effusion.  He is being diuresed for mild volume over load state.  He remained tachycardic despite titration of Lopressor.  EKG was obtained and showed ST segment elevation in the anterior leads.  Cardiology consult was obtained and obtained an Echocardiogram which showed a reduction in his EF and worsening motion of his anterior wall.  There was concern there could be early failure of his bypass grafts and cardiac catheterization was recommended.  This was performed on 6/17 and showed full patency of his coronary bypass grafts.  They recommended further titration of his BB to 100 mg BID.  He remains in Sinus Tachycardia.  His pacing wires have been removed without difficulty.  He was started on a gentle regimen of Lasix, potassium.  His surgical incisions are healing without evidence of infection.  He is medically stable for discharge home today.

## 2021-06-08 NOTE — Op Note (Signed)
NAMEBRECCAN, GALANT MEDICAL RECORD NO: [id number] ACCOUNT NO: [id number] DATE OF BIRTH: 1941-11-26 FACILITY: [facility] LOCATION: [facility] PHYSICIAN: Revonda Standard. Roxan Hockey, MD  Operative Report   DATE OF PROCEDURE: 06/07/2021   PREOPERATIVE DIAGNOSIS:  Left main and severe 2-vessel coronary artery disease with ST elevation myocardial infarction.   POSTOPERATIVE DIAGNOSIS:  Left main and severe 2-vessel coronary artery disease with ST elevation myocardial infarction.   PROCEDURES PERFORMED:   Median sternotomy, extracorporeal circulation, Coronary artery bypass grafting x 4 Left internal mammary artery to LAD, Saphenous vein graft to diagonal and Sequential saphenous vein graft to obtuse marginals 1 and 2.  SURGEON:  Revonda Standard. Roxan Hockey, MD.  ASSISTANT:  Jadene Pierini, PA-C.  ANESTHESIA:  General.  FINDINGS:  Good quality conduits, good quality targets.  Transesophageal echocardiography showed some mild hypokinesis of the anterior and septal regions.  CLINICAL NOTE: Mr. Mellin is a 80 year old man who presented with chest pain.  He was noted to be having an ST elevation MI.  He was taken emergently to the catheterization laboratory, and was found to have critical distal left main disease with proximal  circumflex and LAD disease.  An intraaortic balloon pump was placed for coronary perfusion.  The patient was advised to undergo emergent coronary artery bypass grafting.  The indications, risks, benefits, and alternatives were discussed with the patient.   He accepted the risks and agreed to proceed.  OPERATIVE NOTE:  Mr. Riebel was brought from the catheterization laboratory directly to the operating room.  He had induction of general anesthesia and was intubated.  A sheath was in placed in the right radial artery.  An intraaortic balloon pump was  in place.  A Swan-Ganz catheter was placed by Dr. Annye Asa.  She then performed Transesophageal echocardiography, findings  as noted above.  A Foley catheter was placed.  Intravenous antibiotics were administered.  The chest, abdomen and legs were  prepped and draped in the usual sterile fashion.  A timeout was performed.  A median sternotomy was performed.  The left internal mammary artery was harvested using standard technique.  Simultaneously, an incision was made in the medial aspect of the right leg at the level of the knee.  The greater  saphenous vein was harvested endoscopically.  Both the saphenous vein and mammary artery were good quality vessels.  2000 units of heparin was administered during the vessel harvest.  The remainder of the full heparin dose was given prior to opening the  pericardium.  After harvesting the conduits, the pericardium was opened.  The ascending aorta was inspected.  There was no significant atherosclerotic disease.  The aorta was cannulated via concentric 2-0 Ethibond pledgeted pursestring sutures.  A dual stage venous  cannula was placed via a pursestring suture in the right atrial appendage.  Cardiopulmonary bypass was initiated.  Flows were maintained per protocol.  The patient was cooled to 32 degrees Celsius.  The coronary arteries were inspected and anastomotic  sites were chosen.  The conduits were inspected and cut to length.  A foam pad was placed in the pericardium to insulate the heart.  A temperature probe was placed in the myocardial septum and a cardioplegia cannula was placed in the ascending aorta.  The aorta was cross clamped.  The left ventricle was emptied via the aortic root vent.  Cardiac arrest then was achieved with a combination of cold antegrade blood cardioplegia and topical iced saline.  1.5 liters of cardioplegia was administered.  There  was rapid diastolic  arrest.  Septal cooling was relatively slow, but ultimately cooled to 11 degrees Celsius.  A reversed saphenous vein graft was placed end-to-side to the first diagonal branch of the LAD.  This was a 1.5 mm  good quality target.  The vein was of good quality.  The diagonal was a  1.5 mm good quality target.  The vein was of good quality.  An end-to-side anastomosis was performed with a running 7-0 Prolene suture.  All anastomoses were probed proximally and distally at their completion to ensure patency.  Cardioplegia then was  administered down the vein and there was good flow and good hemostasis.  Additional cardioplegia was administered down the vein and the aortic root.  The heart then was elevated and a reversed saphenous vein graft was placed sequentially to the obtuse marginals 1 and 2.  These were both 1.5 mm good quality vessels.  The  vessels came off at a relatively shallow angle and this was technically more difficult due to having the graft high near the AV groove.  Both anastomoses, the side-to-side to OM1 and the end-to-side to the OM2 were done with running 7-0 Prolene sutures.  A  probe passed easily in both anastomoses at their completion.  Cardioplegia was administered down the graft and there was good flow and good hemostasis.  The left internal mammary artery was brought through a window in the pericardium.  The distal end was bevelled.  It was anastomosed end-to-side to the distal LAD.  The LAD was a 2 mm good quality target.  The mammary was a 2 mm good quality conduit.   The end-to-side anastomosis was performed with a running 8-0 Prolene suture.  At the completion of the mammary to LAD anastomosis, the bulldog clamp was removed.  Rapid septal rewarming was noted.  The bulldog clamp was replaced.  The mammary pedicle was  tacked to the epicardial surface of the heart with 6-0 Prolene sutures.  Additional cardioplegia was administered.  The vein grafts were cut to length.  The proximal vein graft anastomoses were performed to 4.5 mm punch aortotomies with running 6-0 Prolene sutures.  At the completion of the final proximal anastomosis, the  patient was placed in Trendelenburg  position.  Lidocaine was administered.  The aortic root was de-aired and the aortic crossclamp was removed.  The total crossclamp time was 69 minutes.  The patient spontaneously resumed sinus rhythm and did not require  defibrillation.  While rewarming was completed, all proximal and distal anastomoses were inspected for hemostasis.  Epicardial pacing wires were placed on the right ventricle and right atrium.  When the patient had rewarmed to a core temperature 37 degrees  Celsius, he was weaned from cardiopulmonary bypass on the first attempt.  The total bypass time was 100 minutes.  The initial cardiac index was relatively low, but improved with volume resuscitation.  A low dose epinephrine infusion was initiated at 2 mcg  per kilogram per minute, but that was later stopped.  Transesophageal echocardiography was essentially unchanged from the pre-bypass study.  The patient remained hemodynamically stable throughout the post-bypass period.  A test dose of protamine was  administered and was well tolerated.  The atrial and aortic cannula were removed.  The remaining protamine was administered without incident.  The chest was irrigated with warm saline.  Hemostasis was achieved.  Left pleural and mediastinal chest tubes were  placed through separate subcostal incisions.  The pericardium was reapproximated over the aortic root with interrupted 3-0 silk  sutures.  Sternum was closed with a combination of single and double heavy gauge stainless steel wires.  The pectoralis  fascia, subcutaneous tissue and skin were closed in standard fashion.  All sponge, needle and instrument counts were correct at the end of the procedure.  It should be noted that the intraaortic balloon pump, which has been placed in the catheterization  laboratory was on at 1:1 at the time of separation from bypass.  The patient then was transported from the operating room to the surgical intensive care unit, intubated and in good condition.      Elián.Darby D: 06/08/2021 12:37:46 am T: 06/08/2021 2:52:00 am  JOB: [id number]/ [id number]

## 2021-06-09 ENCOUNTER — Inpatient Hospital Stay (HOSPITAL_COMMUNITY): Payer: Medicare PPO

## 2021-06-09 ENCOUNTER — Encounter (HOSPITAL_COMMUNITY): Payer: Self-pay | Admitting: Thoracic Surgery (Cardiothoracic Vascular Surgery)

## 2021-06-09 LAB — CBC
HCT: 33.6 % — ABNORMAL LOW (ref 39.0–52.0)
Hemoglobin: 11.2 g/dL — ABNORMAL LOW (ref 13.0–17.0)
MCH: 32.4 pg (ref 26.0–34.0)
MCHC: 33.3 g/dL (ref 30.0–36.0)
MCV: 97.1 fL (ref 80.0–100.0)
Platelets: 101 10*3/uL — ABNORMAL LOW (ref 150–400)
RBC: 3.46 MIL/uL — ABNORMAL LOW (ref 4.22–5.81)
RDW: 14.7 % (ref 11.5–15.5)
WBC: 12.7 10*3/uL — ABNORMAL HIGH (ref 4.0–10.5)
nRBC: 0 % (ref 0.0–0.2)

## 2021-06-09 LAB — GLUCOSE, CAPILLARY
Glucose-Capillary: 132 mg/dL — ABNORMAL HIGH (ref 70–99)
Glucose-Capillary: 115 mg/dL — ABNORMAL HIGH (ref 70–99)
Glucose-Capillary: 118 mg/dL — ABNORMAL HIGH (ref 70–99)
Glucose-Capillary: 114 mg/dL — ABNORMAL HIGH (ref 70–99)

## 2021-06-09 LAB — BASIC METABOLIC PANEL
Anion gap: 7 (ref 5–15)
BUN: 17 mg/dL (ref 8–23)
CO2: 27 mmol/L (ref 22–32)
Calcium: 8.4 mg/dL — ABNORMAL LOW (ref 8.9–10.3)
Chloride: 102 mmol/L (ref 98–111)
Creatinine, Ser: 0.81 mg/dL (ref 0.61–1.24)
GFR, Estimated: >60 mL/min (ref >60)
Glucose, Bld: 123 mg/dL — ABNORMAL HIGH (ref 70–99)
Potassium: 3.6 mmol/L (ref 3.5–5.1)
Sodium: 136 mmol/L (ref 135–145)

## 2021-06-09 MED ORDER — BUDESONIDE 0.25 MG/2ML IN SUSP
0.2500 mg | Freq: Two times a day (BID) | RESPIRATORY_TRACT | Status: DC
  Administered 2021-06-09 – 2021-06-15 (×12): 0.25 mg via RESPIRATORY_TRACT
  Filled 2021-06-09 (×13): qty 2

## 2021-06-09 MED ORDER — SODIUM CHLORIDE 0.9 % IV SOLN: 250.0000 mL | INTRAVENOUS | Status: DC | PRN

## 2021-06-09 MED ORDER — SODIUM CHLORIDE 0.9% FLUSH
3.0000 mL | Freq: Two times a day (BID) | INTRAVENOUS | Status: DC
  Administered 2021-06-09 – 2021-06-15 (×10): 3 mL via INTRAVENOUS

## 2021-06-09 MED ORDER — MAGNESIUM HYDROXIDE 400 MG/5ML PO SUSP: 30.0000 mL | Freq: Every day | ORAL | Status: DC | PRN

## 2021-06-09 MED ORDER — POTASSIUM CHLORIDE CRYS ER 10 MEQ PO TBCR
20.0000 meq | EXTENDED_RELEASE_TABLET | Freq: Every day | ORAL | Status: DC
  Filled 2021-06-09: qty 2

## 2021-06-09 MED ORDER — ~~LOC~~ CARDIAC SURGERY, PATIENT & FAMILY EDUCATION: Freq: Once | Status: AC

## 2021-06-09 MED ORDER — FUROSEMIDE 40 MG PO TABS
40.0000 mg | ORAL_TABLET | Freq: Every day | ORAL | Status: DC
  Administered 2021-06-09 – 2021-06-11 (×3): 40 mg via ORAL
  Filled 2021-06-09 (×3): qty 1

## 2021-06-09 MED ORDER — ALUM & MAG HYDROXIDE-SIMETH 200-200-20 MG/5ML PO SUSP: 15.0000 mL | Freq: Four times a day (QID) | ORAL | Status: DC | PRN

## 2021-06-09 MED ORDER — METOPROLOL TARTRATE 25 MG PO TABS
25.0000 mg | ORAL_TABLET | Freq: Two times a day (BID) | ORAL | Status: DC
  Administered 2021-06-09 (×2): 25 mg via ORAL
  Filled 2021-06-09 (×2): qty 1

## 2021-06-09 MED ORDER — MELATONIN 3 MG PO TABS
3.0000 mg | ORAL_TABLET | Freq: Every evening | ORAL | Status: DC | PRN
  Administered 2021-06-13 – 2021-06-14 (×3): 3 mg via ORAL
  Filled 2021-06-09 (×3): qty 1

## 2021-06-09 MED ORDER — POTASSIUM CHLORIDE 10 MEQ/50ML IV SOLN
10.0000 meq | INTRAVENOUS | Status: AC
  Administered 2021-06-09 (×2): 10 meq via INTRAVENOUS
  Filled 2021-06-09 (×2): qty 50

## 2021-06-09 MED ORDER — SODIUM CHLORIDE 0.9% FLUSH: 3.0000 mL | INTRAVENOUS | Status: DC | PRN

## 2021-06-09 MED ORDER — POTASSIUM CHLORIDE CRYS ER 20 MEQ PO TBCR
20.0000 meq | EXTENDED_RELEASE_TABLET | ORAL | Status: AC
  Administered 2021-06-09 (×3): 20 meq via ORAL
  Filled 2021-06-09 (×3): qty 1

## 2021-06-09 MED ORDER — MONTELUKAST SODIUM 10 MG PO TABS
10.0000 mg | ORAL_TABLET | Freq: Every day | ORAL | Status: DC
  Administered 2021-06-09 – 2021-06-14 (×6): 10 mg via ORAL
  Filled 2021-06-09 (×6): qty 1

## 2021-06-09 MED ORDER — POTASSIUM CHLORIDE ER 10 MEQ PO TBCR
20.0000 meq | EXTENDED_RELEASE_TABLET | Freq: Every day | ORAL | Status: DC
  Administered 2021-06-09: 20 meq via ORAL
  Filled 2021-06-09: qty 2

## 2021-06-09 NOTE — Progress Notes (Signed)
CARDIAC REHAB PHASE I   PRE:  Rate/Rhythm: 102 ST  BP:  Sitting: 143/93      SaO2: 93 RA  MODE:  Ambulation: 370 ft   POST:  Rate/Rhythm: 121 ST  BP:  Sitting: 149/106    SaO2: 95 RA   Pt ambulated 327ft in hallway assist of one with EVA. Pt states some "wobbliness" upon rising, but denies CP or SOB. Pt returned to recliner. Encouraged continued IS use and walks. Call bell and beside table within reach. Chair alarm on, son at bedside. Will continue to follow.  [id number] Rufina Falco, RN BSN 06/09/2021 1:14 PM

## 2021-06-09 NOTE — Progress Notes (Signed)
Pt arrived to rm 8 from [facility]. Initiated tele. CHG wipe given. Oriented pt to the unit. Vss. Call bell within reach.   Lavenia Atlas, RN

## 2021-06-09 NOTE — Progress Notes (Signed)
3 Days Post-Op Procedure(s) (LRB): CORONARY ARTERY BYPASS GRAFTING (CABG) TIMES FOUR, ON PUMP, USING LEFT INTERNAL MAMMARY ARTERY AND ENDOSCOPICALLY HARVESTED RIGHT GREATER SAPHENOUS VEIN (N/A) TRANSESOPHAGEAL ECHOCARDIOGRAM (TEE) ENDOVEIN HARVEST OF GREATER SAPHENOUS VEIN (Right) Subjective: Some incisional pain, no nausea Has had some impulsive behavior, oriented x 3 and cooperative  Objective: Vital signs in last 24 hours: Temp:  [97.7 F (36.5 C)-99.68 F (37.6 C)] 98.1 F (36.7 C) (06/14 0400) Pulse Rate:  [95-108] 108 (06/14 0600) Cardiac Rhythm: Sinus tachycardia (06/14 0400) Resp:  [15-34] 27 (06/14 0600) BP: (91-157)/(60-99) 139/92 (06/14 0600) SpO2:  [91 %-96 %] 94 % (06/14 0600) Arterial Line BP: (133)/(65) 133/65 (06/13 0800) Weight:  [83.8 kg] 83.8 kg (06/14 0500)  Hemodynamic parameters for last 24 hours: PAP: (35)/(17) 35/17  Intake/Output from previous day: 06/13 0701 - 06/14 0700 In: 302.3 [I.V.:175.6; IV Piggyback:126.7] Out: 2415 [Urine:2415] Intake/Output this shift: No intake/output data recorded.  General appearance: alert, cooperative, and no distress Neurologic: intact Heart: regular rate and rhythm Lungs: diminished breath sounds bibasilar Abdomen: normal findings: soft, non-tender  Lab Results: Recent Labs    06/08/21 1537 06/09/21 0507  WBC 13.5* 12.7*  HGB 11.6* 11.2*  HCT 34.8* 33.6*  PLT 99* 101*   BMET:  Recent Labs    06/08/21 1537 06/09/21 0507  NA 140 136  K 4.1 3.6  CL 105 102  CO2 26 27  GLUCOSE 139* 123*  BUN 16 17  CREATININE 0.93 0.81  CALCIUM 8.5* 8.4*    PT/INR:  Recent Labs    06/07/21 0630  LABPROT 17.6*  INR 1.5*   ABG    Component Value Date/Time   PHART 7.292 (L) 06/07/2021 1640   HCO3 22.6 06/07/2021 1640   TCO2 24 06/07/2021 1640   ACIDBASEDEF 4.0 (H) 06/07/2021 1640   O2SAT 71.3 06/07/2021 2006   CBG (last 3)  Recent Labs    06/08/21 1620 06/08/21 2138 06/09/21 0637  GLUCAP 122* 144*  132*    Assessment/Plan: S/P Procedure(s) (LRB): CORONARY ARTERY BYPASS GRAFTING (CABG) TIMES FOUR, ON PUMP, USING LEFT INTERNAL MAMMARY ARTERY AND ENDOSCOPICALLY HARVESTED RIGHT GREATER SAPHENOUS VEIN (N/A) TRANSESOPHAGEAL ECHOCARDIOGRAM (TEE) ENDOVEIN HARVEST OF GREATER SAPHENOUS VEIN (Right) Plan for transfer to step-down: see transfer orders POD # 2  NEURO- intact CV- mild tachycardia and hypertension, increase metoprolol to 25 BID  ASA, statin, start Plavix after pacing wires out RESP- atelectasis- encourage IS RENAL- creatinine normal, supplement K, diurese ENDO- CBG well controlled Thrombocytopenia- stable Anemia sec to ABL- mild, follow Cardiac rehab DC central line   LOS: 2 days    [name] 06/09/2021

## 2021-06-10 ENCOUNTER — Inpatient Hospital Stay (HOSPITAL_COMMUNITY): Payer: Medicare PPO

## 2021-06-10 LAB — BPAM RBC
Blood Product Expiration Date: 202207122359
Blood Product Expiration Date: 202207122359
Blood Product Expiration Date: 202207122359
Blood Product Expiration Date: 202207122359
Blood Product Expiration Date: 202207122359
Blood Product Expiration Date: 202207122359
Blood Product Expiration Date: 202207122359
Blood Product Expiration Date: 202207122359
ISSUE DATE / TIME: 202206120125
ISSUE DATE / TIME: 202206120125
ISSUE DATE / TIME: 202206120125
ISSUE DATE / TIME: 202206120125
ISSUE DATE / TIME: 202206131220
ISSUE DATE / TIME: 202206131220
Unit Type and Rh: 5100
Unit Type and Rh: 5100
Unit Type and Rh: 5100
Unit Type and Rh: 5100
Unit Type and Rh: 5100
Unit Type and Rh: 5100
Unit Type and Rh: 5100
Unit Type and Rh: 5100

## 2021-06-10 LAB — TYPE AND SCREEN
ABO/RH(D): O POS
Antibody Screen: NEGATIVE
Unit division: 0
Unit division: 0
Unit division: 0
Unit division: 0
Unit division: 0
Unit division: 0
Unit division: 0
Unit division: 0

## 2021-06-10 LAB — CBC
HCT: 36 % — ABNORMAL LOW (ref 39.0–52.0)
Hemoglobin: 12.5 g/dL — ABNORMAL LOW (ref 13.0–17.0)
MCH: 32.5 pg (ref 26.0–34.0)
MCHC: 34.7 g/dL (ref 30.0–36.0)
MCV: 93.5 fL (ref 80.0–100.0)
Platelets: 129 10*3/uL — ABNORMAL LOW (ref 150–400)
RBC: 3.85 MIL/uL — ABNORMAL LOW (ref 4.22–5.81)
RDW: 14.3 % (ref 11.5–15.5)
WBC: 14.2 10*3/uL — ABNORMAL HIGH (ref 4.0–10.5)
nRBC: 0 % (ref 0.0–0.2)

## 2021-06-10 LAB — BASIC METABOLIC PANEL
Anion gap: 9 (ref 5–15)
BUN: 15 mg/dL (ref 8–23)
CO2: 24 mmol/L (ref 22–32)
Calcium: 8.5 mg/dL — ABNORMAL LOW (ref 8.9–10.3)
Chloride: 104 mmol/L (ref 98–111)
Creatinine, Ser: 0.7 mg/dL (ref 0.61–1.24)
GFR, Estimated: >60 mL/min (ref >60)
Glucose, Bld: 109 mg/dL — ABNORMAL HIGH (ref 70–99)
Potassium: 3.9 mmol/L (ref 3.5–5.1)
Sodium: 137 mmol/L (ref 135–145)

## 2021-06-10 LAB — GLUCOSE, CAPILLARY: Glucose-Capillary: 94 mg/dL (ref 70–99)

## 2021-06-10 MED ORDER — POTASSIUM CHLORIDE CRYS ER 20 MEQ PO TBCR
40.0000 meq | EXTENDED_RELEASE_TABLET | Freq: Every day | ORAL | Status: DC
  Administered 2021-06-10 – 2021-06-11 (×2): 40 meq via ORAL
  Filled 2021-06-10 (×2): qty 2

## 2021-06-10 MED ORDER — METOPROLOL TARTRATE 25 MG PO TABS
37.5000 mg | ORAL_TABLET | Freq: Two times a day (BID) | ORAL | Status: DC
  Administered 2021-06-10 (×2): 37.5 mg via ORAL
  Filled 2021-06-10 (×2): qty 1

## 2021-06-10 MED ORDER — LOSARTAN POTASSIUM 25 MG PO TABS
25.0000 mg | ORAL_TABLET | Freq: Every day | ORAL | Status: DC
  Administered 2021-06-10 – 2021-06-15 (×6): 25 mg via ORAL
  Filled 2021-06-10 (×7): qty 1

## 2021-06-10 NOTE — Plan of Care (Signed)
  Problem: Activity: Goal: Risk for activity intolerance will decrease Outcome: Progressing   Problem: Clinical Measurements: Goal: Postoperative complications will be avoided or minimized Outcome: Progressing   Problem: Respiratory: Goal: Respiratory status will improve Outcome: Progressing

## 2021-06-10 NOTE — Progress Notes (Addendum)
Patient sats 88-90% RA. Patient placed on 2L O2 to maintain sats >92%. Patient encouraged to use IS. Will continue to monitor

## 2021-06-10 NOTE — Progress Notes (Signed)
CARDIAC REHAB PHASE I   PRE:  Rate/Rhythm: 111 ST  BP:  Sitting: 133/93      SaO2: 96 RA  MODE:  Ambulation: 370 ft   POST:  Rate/Rhythm: 124 ST  BP:  Sitting: 142/85    SaO2: 96 RA   Pt helped to BR than ambulated 336ft in hallway assist of one with front wheel walker and gait belt. Pt denies CP, SOB, or dizziness throughout walk. Pt returned to recliner. Call bell and bedside table within reach. Encouraged continued IS use and walks. Will continue to follow.  [phone] Rufina Falco, RN BSN 06/10/2021 11:31 AM

## 2021-06-10 NOTE — Progress Notes (Signed)
Mobility Specialist: Progress Note   06/10/21 1557  Mobility  Activity Ambulated in hall  Level of Assistance Minimal assist, patient does 75% or more  Assistive Device Front wheel walker  Distance Ambulated (ft) 330 ft  Mobility Ambulated with assistance in hallway  Mobility Response Tolerated well  Mobility performed by Mobility specialist  $Mobility charge 1 Mobility   Pre-Mobility: 118 HR, 94% SpO2 Post-Mobility: 117 HR, 140/103 (115) BP, 100% SpO2  Pt received sitting EOB upon entering room with NT present in the room. Pt informed NT I assisted pt back to the bed from the BR but I had not. Pt had independently walked from BR back to the bed prior to mobility specialist entering the room. Pt was minA to stand from EOB and contact guard during ambulation, asx throughout. Pt sitting EOB after walk to eat with call bell at his side. Instructed pt to call out if he needed to get up for any reason.   [facility] Koby Pickup Mobility Specialist Mobility Specialist Phone: [phone]

## 2021-06-10 NOTE — Plan of Care (Signed)
  Problem: Activity: Goal: Risk for activity intolerance will decrease Outcome: Progressing   Problem: Skin Integrity: Goal: Risk for impaired skin integrity will decrease Outcome: Progressing   

## 2021-06-10 NOTE — Care Management Important Message (Signed)
Important Message  Patient Details  Name: Chad Washington MRN: [id number] Date of Birth: [birth year]   Medicare Important Message Given:  Yes     Sharnese Heath Montine Circle 06/10/2021, 11:58 AM

## 2021-06-10 NOTE — Progress Notes (Signed)
      [address]       [place] [address]             [phone]       4 Days Post-Op Procedure(s) (LRB): CORONARY ARTERY BYPASS GRAFTING (CABG) TIMES FOUR, ON PUMP, USING LEFT INTERNAL MAMMARY ARTERY AND ENDOSCOPICALLY HARVESTED RIGHT GREATER SAPHENOUS VEIN (N/A) TRANSESOPHAGEAL ECHOCARDIOGRAM (TEE) ENDOVEIN HARVEST OF GREATER SAPHENOUS VEIN (Right)  Subjective:  Patient is doing well overall considering.  His pain is well controlled.  Hasn't yet moved his bowels, but is passing gas denies N/V.  Has walked, but states overall he hasn't been that cooperative with it.    Objective: Vital signs in last 24 hours: Temp:  [98.1 F (36.7 C)-98.9 F (37.2 C)] 98.4 F (36.9 C) (06/15 0304) Pulse Rate:  [101-123] 108 (06/15 0304) Cardiac Rhythm: Sinus tachycardia (06/14 2200) Resp:  [18-36] 20 (06/15 0304) BP: (134-152)/(88-109) 138/94 (06/15 0304) SpO2:  [91 %-97 %] 95 % (06/15 0304) Weight:  [81.2 kg-83.3 kg] 81.2 kg (06/15 0304)  Intake/Output from previous day: 06/14 0701 - 06/15 0700 In: 583.8 [I.V.:283.7; IV Piggyback:300.1] Out: 1075 [Urine:1075]   General appearance: alert, cooperative, and no distress Heart: regular rate and rhythm and tachycardic Lungs: diminished breath sounds left base Abdomen: soft, non-tender; bowel sounds normal; no masses,  no organomegaly Extremities: edema trace Wound: clean and dry  Lab Results: Recent Labs    06/09/21 0507 06/10/21 0054  WBC 12.7* 14.2*  HGB 11.2* 12.5*  HCT 33.6* 36.0*  PLT 101* 129*   BMET:  Recent Labs    06/09/21 0507 06/10/21 0054  NA 136 137  K 3.6 3.9  CL 102 104  CO2 27 24  GLUCOSE 123* 109*  BUN 17 15  CREATININE 0.81 0.70  CALCIUM 8.4* 8.5*    PT/INR: No results for input(s): LABPROT, INR in the last 72 hours. ABG    Component Value Date/Time   PHART 7.292 (L) 06/07/2021 1640   HCO3 22.6 06/07/2021 1640   TCO2 24 06/07/2021 1640   ACIDBASEDEF 4.0 (H) 06/07/2021 1640    O2SAT 71.3 06/07/2021 2006   CBG (last 3)  Recent Labs    06/09/21 1614 06/09/21 2138 06/10/21 0623  GLUCAP 118* 114* 94    Assessment/Plan: S/P Procedure(s) (LRB): CORONARY ARTERY BYPASS GRAFTING (CABG) TIMES FOUR, ON PUMP, USING LEFT INTERNAL MAMMARY ARTERY AND ENDOSCOPICALLY HARVESTED RIGHT GREATER SAPHENOUS VEIN (N/A) TRANSESOPHAGEAL ECHOCARDIOGRAM (TEE) ENDOVEIN HARVEST OF GREATER SAPHENOUS VEIN (Right)  CV- Sinus Tachycardia, HTN- will increase Lopressor, start low dose Cozaar for additional BP control.. will hold off on EPW to see if we can get better HR control... Im concerned he is trying to convert to Atrial Fibrillation Pulm- off oxygen, small left pleural effusion, continue IS Renal- creatinine stable, weight is up about 9 lbs, continue Lasix, potassium Deconditioning- [facility], his wife has dementia and is unable to provide care for him... he will need to stay at the SNF side at his retirement community will have TOC help arrange CBGs controlled- not a diabetic, will d/c ssip Dispo- patient stable, remains tachycardic, hypertensive.. titrate BB, add low dose Cozaar, continue diuretics, potassium,  no BM yet continue stool softners, will add prn laxative, continue current care   LOS: 3 days    Ellwood Handler, PA-C 06/10/2021

## 2021-06-11 ENCOUNTER — Inpatient Hospital Stay (HOSPITAL_COMMUNITY): Payer: Medicare PPO

## 2021-06-11 DIAGNOSIS — I2109 ST elevation (STEMI) myocardial infarction involving other coronary artery of anterior wall: Secondary | ICD-10-CM | POA: Diagnosis not present

## 2021-06-11 DIAGNOSIS — R9431 Abnormal electrocardiogram [ECG] [EKG]: Secondary | ICD-10-CM

## 2021-06-11 DIAGNOSIS — Z951 Presence of aortocoronary bypass graft: Secondary | ICD-10-CM | POA: Diagnosis not present

## 2021-06-11 LAB — ECHOCARDIOGRAM LIMITED
Height: 72 in
Weight: 2804.8 oz

## 2021-06-11 LAB — TROPONIN I (HIGH SENSITIVITY)
Troponin I (High Sensitivity): 5931 ng/L (ref <18)
Troponin I (High Sensitivity): 5414 ng/L (ref <18)

## 2021-06-11 MED ORDER — SODIUM CHLORIDE 0.9% FLUSH: 3.0000 mL | INTRAVENOUS | Status: DC | PRN

## 2021-06-11 MED ORDER — SODIUM CHLORIDE 0.9 % IV SOLN: INTRAVENOUS | Status: DC

## 2021-06-11 MED ORDER — METOPROLOL TARTRATE 50 MG PO TABS
50.0000 mg | ORAL_TABLET | Freq: Two times a day (BID) | ORAL | Status: DC
  Administered 2021-06-11 (×2): 50 mg via ORAL
  Filled 2021-06-11 (×2): qty 1

## 2021-06-11 MED ORDER — SODIUM CHLORIDE 0.9 % IV SOLN: 250.0000 mL | INTRAVENOUS | Status: DC | PRN

## 2021-06-11 MED ORDER — SODIUM CHLORIDE 0.9% FLUSH
3.0000 mL | Freq: Two times a day (BID) | INTRAVENOUS | Status: DC
  Administered 2021-06-11: 3 mL via INTRAVENOUS

## 2021-06-11 MED FILL — Magnesium Sulfate Inj 50%: INTRAMUSCULAR | Qty: 10 | Status: AC

## 2021-06-11 MED FILL — Electrolyte-R (PH 7.4) Solution: INTRAVENOUS | Qty: 4000 | Status: AC

## 2021-06-11 MED FILL — Heparin Sodium (Porcine) Inj 1000 Unit/ML: INTRAMUSCULAR | Qty: 30 | Status: AC

## 2021-06-11 MED FILL — Sodium Bicarbonate IV Soln 8.4%: INTRAVENOUS | Qty: 50 | Status: AC

## 2021-06-11 MED FILL — Lidocaine HCl Local Soln Prefilled Syringe 100 MG/5ML (2%): INTRAMUSCULAR | Qty: 5 | Status: AC

## 2021-06-11 MED FILL — Potassium Chloride Inj 2 mEq/ML: INTRAVENOUS | Qty: 40 | Status: AC

## 2021-06-11 MED FILL — Sodium Chloride IV Soln 0.9%: INTRAVENOUS | Qty: 2000 | Status: AC

## 2021-06-11 NOTE — Progress Notes (Signed)
Echo images reviewed. Anterior and septal walls look severely hypokinetic/akinetic. On post-op TEE, the anterior and septal walls reportedly were mildly hypokinetic. EF also decreased to ~25-30% from 45-50%. Trop 5000 with repeat pending. Will plan for cath tonight if trop rising vs tomorrow AM if down-trending. Plan dicussed with CV surgery, the patient, nursing and his family.  The patient remains asymptomatic and comfortable. No anginal or HF symptoms.   Gwyndolyn Kaufman, MD

## 2021-06-11 NOTE — NC FL2 (Addendum)
[place] LEVEL OF CARE SCREENING TOOL     IDENTIFICATION  Patient Name: Chad Washington Birthdate: 06/05/1941 Sex: male Admission Date (Current Location): 06/06/2021  [facility] and [place] Number:  Herbalist and Address:  [facility], [place] [address], [place], [place] [address]      Provider Number: [id number]  Attending Physician Name and Address:  [name], MD  Relative Name and Phone Number:  Shamir, Tuzzolino (Son)   [phone] [facility])    Current Level of Care: Hospital Recommended Level of Care: [place] Prior Approval Number:    Date Approved/Denied:   PASRR Number: [id number]  Discharge Plan: SNF    Current Diagnoses: Patient Active Problem List   Diagnosis Date Noted   Status post surgery 06/07/2021   Acute ST elevation myocardial infarction (STEMI) of anterior wall ([place]) 06/07/2021   S/P CABG x 4 06/07/2021    Orientation RESPIRATION BLADDER Height & Weight     Self, Time, Situation, Place  Normal Continent Weight: 175 lb 4.8 oz (79.5 kg) Height:  6' (182.9 cm)  BEHAVIORAL SYMPTOMS/MOOD NEUROLOGICAL BOWEL NUTRITION STATUS      Continent Diet (See d/c summary)  AMBULATORY STATUS COMMUNICATION OF NEEDS Skin   Extensive Assist Verbally Surgical wounds (incision chest; incision right leg)                       Personal Care Assistance Level of Assistance  Bathing, Feeding, Dressing Bathing Assistance: Limited assistance Feeding assistance: Independent Dressing Assistance: Limited assistance     Functional Limitations Info  Sight, Hearing, Speech Sight Info: Impaired Hearing Info: Adequate Speech Info: Adequate    SPECIAL CARE FACTORS FREQUENCY  PT (By licensed PT), OT (By licensed OT)     PT Frequency: 5x/week OT Frequency: 5x/week            Contractures Contractures Info: Not present    Additional Factors Info  Code Status, Allergies Code Status  Info: Full code Allergies Info: Latex           Current Medications (06/11/2021):  This is the current hospital active medication list Current Facility-Administered Medications  Medication Dose Route Frequency Provider Last Rate Last Admin   0.9 %  sodium chloride infusion  250 mL Intravenous PRN [name], MD       acetaminophen (TYLENOL) tablet 1,000 mg  1,000 mg Oral Q6H [name], MD   1,000 mg at 06/10/21 2357   Or   acetaminophen (TYLENOL) 160 MG/5ML solution 1,000 mg  1,000 mg Per Tube Q6H [name], MD       alum & mag hydroxide-simeth (MAALOX/MYLANTA) 200-200-20 MG/5ML suspension 15 mL  15 mL Oral Q6H PRN [name], MD       aspirin EC tablet 325 mg  325 mg Oral Daily [name], MD   325 mg at 06/11/21 0175   Or   aspirin chewable tablet 324 mg  324 mg Per Tube Daily [name], MD       atorvastatin (LIPITOR) tablet 40 mg  40 mg Oral Daily [name], MD   40 mg at 06/11/21 0844   bisacodyl (DULCOLAX) EC tablet 10 mg  10 mg Oral Daily [name], MD   10 mg at 06/11/21 1025   Or   bisacodyl (DULCOLAX) suppository 10 mg  10 mg Rectal Daily [name], MD  budesonide (PULMICORT) nebulizer solution 0.25 mg  0.25 mg Nebulization BID [name], MD   0.25 mg at 06/11/21 3748   Chlorhexidine Gluconate Cloth 2 % PADS 6 each  6 each Topical Daily [name], MD   6 each at 06/11/21 0845   docusate sodium (COLACE) capsule 200 mg  200 mg Oral Daily [name], MD   200 mg at 06/11/21 0843   furosemide (LASIX) tablet 40 mg  40 mg Oral Daily [name], MD   40 mg at 06/11/21 0844   guaiFENesin (MUCINEX) 12 hr tablet 600 mg  600 mg Oral BID [name], MD   600 mg at 06/11/21 0844   ketotifen (ZADITOR) 0.025 % ophthalmic solution 1 drop  1 drop Both Eyes BID [name], MD   1 drop at 06/11/21 0845   losartan (COZAAR)  tablet 25 mg  25 mg Oral Daily Kino Dunsworth R, PA-C   25 mg at 06/11/21 0844   magnesium hydroxide (MILK OF MAGNESIA) suspension 30 mL  30 mL Oral Daily PRN [name], MD       MEDLINE mouth rinse  15 mL Mouth Rinse BID [name], MD   15 mL at 06/11/21 0845   melatonin tablet 3 mg  3 mg Oral QHS PRN [name], MD       metoprolol tartrate (LOPRESSOR) injection 2.5-5 mg  2.5-5 mg Intravenous Q2H PRN [name], MD   5 mg at 06/08/21 0650   metoprolol tartrate (LOPRESSOR) tablet 50 mg  50 mg Oral BID [name], MD   50 mg at 06/11/21 0844   montelukast (SINGULAIR) tablet 10 mg  10 mg Oral q1800 [name], MD   10 mg at 06/10/21 1638   ondansetron (ZOFRAN) injection 4 mg  4 mg Intravenous Q6H PRN [name], MD       oxyCODONE (Oxy IR/ROXICODONE) immediate release tablet 5-10 mg  5-10 mg Oral Q3H PRN [name], MD       pantoprazole (PROTONIX) EC tablet 40 mg  40 mg Oral Daily [name], MD   40 mg at 06/11/21 0843   potassium chloride SA (KLOR-CON) CR tablet 40 mEq  40 mEq Oral Daily Darvin Dials R, PA-C   40 mEq at 06/11/21 0844   sodium chloride flush (NS) 0.9 % injection 3 mL  3 mL Intravenous Q12H [name], MD   3 mL at 06/11/21 0845   sodium chloride flush (NS) 0.9 % injection 3 mL  3 mL Intravenous PRN [name], MD       traMADol Veatrice Bourbon) tablet 50-100 mg  50-100 mg Oral Q4H PRN [name], MD         Discharge Medications: Please see discharge summary for a list of discharge medications.  Relevant Imaging Results:  Relevant Lab Results:   Additional Information SSN [place], [place]

## 2021-06-11 NOTE — Progress Notes (Signed)
CARDIAC REHAB PHASE I   Returned to offer to walk with pt. Pt states RN requesting hold on ambulation as HR 120s at rest. Pt given fresh water. Son at bedside. Will return to ambulate as able and time allows.  [id number] Rufina Falco, RN BSN 06/11/2021 11:11 AM

## 2021-06-11 NOTE — Progress Notes (Addendum)
Progress Note  Patient Name: Chad Washington Date of Encounter: 06/11/2021  [facility] Cardiologist: [name], MD   Subjective   Called to evaluate patient due to STE in anterior leads on ECG that was obtained due to persistent tachycardia with HR 110-120s.   During my evaluation, the patient is well appearing and comfortable. Denies any chest pain or SOB. Has ambulated the hallway several times without issues. Feels tired after his surgery but overall is improving.   Inpatient Medications    Scheduled Meds:  acetaminophen  1,000 mg Oral Q6H   Or   acetaminophen (TYLENOL) oral liquid 160 mg/5 mL  1,000 mg Per Tube Q6H   aspirin EC  325 mg Oral Daily   Or   aspirin  324 mg Per Tube Daily   atorvastatin  40 mg Oral Daily   bisacodyl  10 mg Oral Daily   Or   bisacodyl  10 mg Rectal Daily   budesonide  0.25 mg Nebulization BID   Chlorhexidine Gluconate Cloth  6 each Topical Daily   docusate sodium  200 mg Oral Daily   guaiFENesin  600 mg Oral BID   ketotifen  1 drop Both Eyes BID   losartan  25 mg Oral Daily   mouth rinse  15 mL Mouth Rinse BID   metoprolol tartrate  50 mg Oral BID   montelukast  10 mg Oral q1800   pantoprazole  40 mg Oral Daily   sodium chloride flush  3 mL Intravenous Q12H   Continuous Infusions:  sodium chloride     PRN Meds: sodium chloride, alum & mag hydroxide-simeth, magnesium hydroxide, melatonin, metoprolol tartrate, ondansetron (ZOFRAN) IV, oxyCODONE, sodium chloride flush, traMADol   Vital Signs    Vitals:   06/11/21 0528 06/11/21 0744 06/11/21 0835 06/11/21 1146  BP: (!) 139/93 (!) 140/95  104/73  Pulse: (!) 109 (!) 117  (!) 112  Resp: 20 20  19   Temp: 98.4 F (36.9 C) 99.1 F (37.3 C)  98.3 F (36.8 C)  TempSrc: Oral Oral  Oral  SpO2: 94% 94% 99% 98%  Weight: 79.5 kg     Height:        Intake/Output Summary (Last 24 hours) at 06/11/2021 1227 Last data filed at 06/11/2021 0531 Gross per 24 hour  Intake 1160 ml   Output 675 ml  Net 485 ml   Last 3 Weights 06/11/2021 06/10/2021 06/09/2021  Weight (lbs) 175 lb 4.8 oz 179 lb 1.6 oz 183 lb 10.3 oz  Weight (kg) 79.516 kg 81.239 kg 83.3 kg      Telemetry    Sinus tachycardia with HR 100-120s (has been ongoing for several days) - Personally Reviewed  ECG    Sinus tachycardia with anterior STE - Personally Reviewed  Physical Exam   GEN: Comfortable, sitting up in a chair, NAD  Neck: No JVD Cardiac: Tachycardic, regular, no murmurs Respiratory: Clear to auscultation bilaterally. GI: Soft, nontender, non-distended  MS: Warm, trace edema Neuro:  Nonfocal  Psych: Normal affect   Labs    High Sensitivity Troponin:   Recent Labs  Lab 06/06/21 2252 06/06/21 2355  TROPONINIHS 233* 785*      Chemistry Recent Labs  Lab 06/06/21 2252 06/06/21 2355 06/07/21 0028 06/08/21 1537 06/09/21 0507 06/10/21 0054  NA 138 137   < > 140 136 137  K 3.7 3.5   < > 4.1 3.6 3.9  CL 105 105   < > 105 102 104  CO2 26  23   < > 26 27 24   GLUCOSE 109* 140*   < > 139* 123* 109*  BUN 16 13   < > 16 17 15   CREATININE 0.93 0.83   < > 0.93 0.81 0.70  CALCIUM 8.9 8.8*   < > 8.5* 8.4* 8.5*  PROT 6.6 6.3*  --   --   --   --   ALBUMIN 4.0 3.5  --   --   --   --   AST 39 38  --   --   --   --   ALT 32 29  --   --   --   --   ALKPHOS 39 39  --   --   --   --   BILITOT 0.7 0.7  --   --   --   --   GFRNONAA >60 >60   < > >60 >60 >60  ANIONGAP 7 9   < > 9 7 9    < > = values in this interval not displayed.     Hematology Recent Labs  Lab 06/08/21 1537 06/09/21 0507 06/10/21 0054  WBC 13.5* 12.7* 14.2*  RBC 3.54* 3.46* 3.85*  HGB 11.6* 11.2* 12.5*  HCT 34.8* 33.6* 36.0*  MCV 98.3 97.1 93.5  MCH 32.8 32.4 32.5  MCHC 33.3 33.3 34.7  RDW 15.0 14.7 14.3  PLT 99* 101* 129*    BNPNo results for input(s): BNP, PROBNP in the last 168 hours.   DDimer No results for input(s): DDIMER in the last 168 hours.   Radiology    DG Chest 2 View  Result Date:  06/10/2021 CLINICAL DATA:  Atelectasis.  Recent coronary artery bypass grafting EXAM: CHEST - 2 VIEW COMPARISON:  June 09, 2021 FINDINGS: Cordis has been removed. Temporary pacemaker wires remain attached to the right heart. Persistent trace apical pneumothorax. There is subcutaneous air overlying the left upper chest. There are small pleural effusions on each side with atelectatic change in the left lower lobe. There is also slight atelectasis in the right upper lobe. Heart size and pulmonary vascular normal. Status post coronary artery bypass grafting. No adenopathy no bone lesions IMPRESSION: Cordis removed. Temporary pacemaker wires attached to right heart. Subcutaneous air on the left with trace apical pneumothorax. No pneumothorax on the right. Small pleural effusions bilaterally with left lower lobe atelectatic change. Stable cardiac silhouette. Electronically Signed   By: Lowella Grip III M.D.   On: 06/10/2021 08:11    Cardiac Studies   LHC 06/06/21: Ramus lesion is 85% stenosed. Dist LM to Ost LAD lesion is 95% stenosed. Ost Cx lesion is 95% stenosed. Prox LAD lesion is 95% stenosed. 1st Diag lesion is 70% stenosed. Prox RCA lesion is 30% stenosed. RPDA lesion is 50% stenosed.   Severe 95% distal left main stenosis percent proximal LAD stenosis, 70% diffuse first diagonal stenosis, 95% ostial left circumflex stenosis.  The RCA has mild luminal irregularity with narrowings of 20 and 30%.  There is a 50% ostial narrowing and a small PDA vessel.   Acute LV dysfunction with EF estimate approximately 50% with mild distal anterior lateral/apical hypocontractility.  LVEDP 23 mmHg.   Insertion of intra-aortic balloon pump for hemodynamic support prior to emergent CaBG surgery this evening.   RECOMMENDATION: The patient will be taken to the operating room by Dr. Modesto Charon emergently for critical left main stenosis with subtotal LAD and ostial circumflex stenoses.  TEE  06/07/21: POST-OP IMPRESSIONS  Limited Post-CPB exam:  The patient separated easily from CPB, requiring  minimal  Phenylephrine support, with IABP resumed. DDD pacing was initially  required,  however the patient resumed NSR and pacing was paused.     - Left Ventricle: The left ventricular function is essentially unchanged  from  pre-bypass images. The anterior and septal regions are mildly hypokinetic,  improving with over time off of CPB. The overall EF appears 45-50%,  measured  44%.  - Right Ventricle: The right ventricular function appears unchanged from  pre-bypass images. Mild hypokinesis improved to normal with time off of  CPB.  - Aorta: The aorta appears unchanged from pre-bypass. IABP is present in  the  thoracic aorta.  - Aortic Valve: The aortic valve function appears unchanged from  pre-bypass  images. There is no AI.  - Mitral Valve: The mitral valve initially showed tethering of the  posterior  leaflet, with moderate-severe MR on separation from CPB. This showed  continutal  improvement over time. Once NSR resumed, and pacing was no longer  necessary, the  mitral valve functioned normally, with only trace MR. The final images of  the  mitral valve appear unchanged from pre-bypass images.  - Tricuspid Valve: The tricuspid valve function appears unchanged from  pre-bypass images. Mild TR was present initially on separation from CPB,  yet  improved and was no longer evident at the end of surgery.   PRE-OP FINDINGS   Left Ventricle: The left ventricle has low normal systolic function, with  an ejection fraction of 50-55%, measured 54.8%. The anterior wall is  mildly hypokinetic. The cavity size was normal. There is moderate  concentric left ventricular hypertrophy.  Left ventricular diastolic function was not evaluated.   Right Ventricle: The right ventricle has normal systolic function. The  cavity was normal. There is no increase in right ventricular wall   thickness. Catheter present in the right ventricle.   Left Atrium: Left atrial size was normal in size. No left atrial/left  atrial appendage thrombus was detected. Left atrial appendage velocity is  normal at greater than 40 cm/s.   Right Atrium: Right atrial size was normal in size. Catheter present in  the right atrium.   Interatrial Septum: No atrial level shunt detected by color flow Doppler.  There is no evidence of a patent foramen ovale.   Pericardium: There is no evidence of pericardial effusion.   Mitral Valve: The mitral valve is normal in structure and function. Mitral  valve regurgitation is trivial by color flow Doppler. There is no evidence  of mitral valve vegetation. There is no evidence of mitral stenosis, with  peak gradient 2 mmHg, mean  gradient 1 mmHg.   Tricuspid Valve: The tricuspid valve was normal in structure. Tricuspid  valve regurgitation was not visualized by color flow Doppler. No evidence  of tricuspid stenosis is present. There is no evidence of tricuspid valve  vegetation.   Aortic Valve: The aortic valve is tricuspid. Aortic valve regurgitation  was not visualized by color flow Doppler. There is no stenosis of the  aortic valve, with peak gradient 6 mmHg, mean gradient 3 mmHg. There is no  evidence of aortic valve vegetation.   Pulmonic Valve: The pulmonic valve was normal in structure, with normal  leaflet excursion. No evidence of pulmonic stenosis. Pulmonic valve  regurgitation is trivial, around the PA catheter, by color flow Doppler.    Aorta: The aortic root, ascending aorta and aortic arch are normal in size  and structure. There is  an IABP present in the thoracic aorta.   Pulmonary Artery: Gordy Councilman catheter present on the right. The pulmonary  artery is of normal size.   Venous: The inferior vena cava was not well visualized. The inferior vena  cava appeared normal in size with greater than 50% respiratory  variability,  suggesting right atrial pressure of 3 mmHg.   Shunts: There is no evidence of an atrial septal defect.    Patient Profile     80 y.o. male with no significant past medical history who presented with worsening chest pain found to have ECG changes consistent with STEMI. Was taken emergently to cath which revealed critical distal left main stenosis of at least 95% with ostial 95% circumflex stenosis as well as near ostial 95% LAD stenosis.  There was diagonal disease of approximately 60%.  His RCA had mild luminal irregularity without high-grade stenosis although it was 50% narrowing and a small PDA1 vessel prompting emergent CABG by Dr Roxan Hockey on 06/07/21. He has been progressing well post-operatively. ECG obtained today due to persistent sinus tachycardia revealed anterior STE for which we were called to evaluate. Currently, the patient is asymptomatic and well appearing.   Assessment & Plan    #Anterior STE on ECG: ECG obtained today for persistent tachycardia with HR 100-120s revealed anterior STE for which we have been called to evaluate. Overall, the patient is doing very well post-operatively and denies any chest pain with exertion, pleuritic chest pain, shortness of breath or HF symptoms. He is able to ambulate the hallway without anginal symptoms. Clinically, he does not appear to be having an acute event and he is overall doing very well. While his ECG does have concerning changes, he has not had prior ones since 06/07/21 and given his severe disease on presentation, these ECG changes may have been present prior as a part of the evolution of his STEMI. Will check trop, stat TTE to assess for acute WMA, and continue to monitor. -Follow-up troponins to ensure not acutely rising; delta trop will be the most telling as we are unsure what his trop rose to after his STEMI/CABG procedure -Obtain stat limited TTE to assess for new WMA -Will discuss case with interventional Cardiology as well   -Continue ASA 325mg  daily -Continue lipitor 40mg  daily -Continue losartan daily -Continue metop tartrate 50mg  BID -Fortunately, exam benign and patient is overall progressing well post-operatively  #STEMI: #Severe multivessel CAD s/p emergent CABG: Now s/p CABG on 06/07/21 with LMIA-LAD, SVG-to-D1, sequential SVG to OM1 and OM2.  -Management of ECG changes as above -Post-op care per CV surgery -Continue ASA 325mg  daily -Continue lipitor 40mg  daily -Continue losartan daily -Continue metop tartrate 50mg  BID  Plan discussed with patient, his son and nursing staff. Will discuss with interventional cardiology as well.     For questions or updates, please contact [place] Please consult [email or web address] for contact info under        Signed, Freada Bergeron, MD  06/11/2021, 12:27 PM

## 2021-06-11 NOTE — Discharge Instructions (Signed)

## 2021-06-11 NOTE — Progress Notes (Signed)
OT Cancellation Note  Patient Details Name: SUKHMAN MARTINE MRN: [id number] DOB: 06/13/1941   Cancelled Treatment:    Reason Eval/Treat Not Completed: Medical issues which prohibited therapy- per RN patient with recent EKG changes and checking troponin levels.  Requested OT to hold at this time.  Will check back later as able/appropriate.   Jolaine Artist, OT Acute Rehabilitation Services Pager [phone] Office [phone]    Delight Stare 06/11/2021, 12:18 PM

## 2021-06-11 NOTE — Progress Notes (Signed)
Echocardiogram 2D Echocardiogram has been performed.  Chad Washington 06/11/2021, 1:31 PM  Dr. Johney Frame notified of critical results.

## 2021-06-11 NOTE — Progress Notes (Signed)
EKG completed. Notified PA with results.

## 2021-06-11 NOTE — Progress Notes (Addendum)
      [address]       [place] [address]             [phone]      5 Days Post-Op Procedure(s) (LRB): CORONARY ARTERY BYPASS GRAFTING (CABG) TIMES FOUR, ON PUMP, USING LEFT INTERNAL MAMMARY ARTERY AND ENDOSCOPICALLY HARVESTED RIGHT GREATER SAPHENOUS VEIN (N/A) TRANSESOPHAGEAL ECHOCARDIOGRAM (TEE) ENDOVEIN HARVEST OF GREATER SAPHENOUS VEIN (Right)  Subjective:  No new complaints.  Continues to do well.  Per nursing he again experienced some confusion overnight.  Objective: Vital signs in last 24 hours: Temp:  [98.2 F (36.8 C)-99.4 F (37.4 C)] 99.1 F (37.3 C) (06/16 0744) Pulse Rate:  [109-126] 117 (06/16 0744) Cardiac Rhythm: Sinus tachycardia (06/15 2100) Resp:  [17-20] 20 (06/16 0744) BP: (139-144)/(92-97) 140/95 (06/16 0744) SpO2:  [93 %-96 %] 94 % (06/16 0744) Weight:  [79.5 kg] 79.5 kg (06/16 0528)  Intake/Output from previous day: 06/15 0701 - 06/16 0700 In: 1400 [P.O.:1400] Out: 675 [Urine:675]   General appearance: alert, cooperative, and no distress Heart: regular rate and rhythm and tachycardia Lungs: clear to auscultation bilaterally Abdomen: soft, non-tender; bowel sounds normal; no masses,  no organomegaly Extremities: edema trace Wound: clean and dry  Lab Results: Recent Labs    06/09/21 0507 06/10/21 0054  WBC 12.7* 14.2*  HGB 11.2* 12.5*  HCT 33.6* 36.0*  PLT 101* 129*   BMET:  Recent Labs    06/09/21 0507 06/10/21 0054  NA 136 137  K 3.6 3.9  CL 102 104  CO2 27 24  GLUCOSE 123* 109*  BUN 17 15  CREATININE 0.81 0.70  CALCIUM 8.4* 8.5*    PT/INR: No results for input(s): LABPROT, INR in the last 72 hours. ABG    Component Value Date/Time   PHART 7.292 (L) 06/07/2021 1640   HCO3 22.6 06/07/2021 1640   TCO2 24 06/07/2021 1640   ACIDBASEDEF 4.0 (H) 06/07/2021 1640   O2SAT 71.3 06/07/2021 2006   CBG (last 3)  Recent Labs    06/09/21 1614 06/09/21 2138 06/10/21 0623  GLUCAP 118* 114* 94     Assessment/Plan: S/P Procedure(s) (LRB): CORONARY ARTERY BYPASS GRAFTING (CABG) TIMES FOUR, ON PUMP, USING LEFT INTERNAL MAMMARY ARTERY AND ENDOSCOPICALLY HARVESTED RIGHT GREATER SAPHENOUS VEIN (N/A) TRANSESOPHAGEAL ECHOCARDIOGRAM (TEE) ENDOVEIN HARVEST OF GREATER SAPHENOUS VEIN (Right)  CV- Sinus Tachycardia persists- on Lopressor 50 mg BID, Cozaar 25 mg daily.. will d/c EPW today Pulm- no acute issues, continue IS Renal- creatinine stable, weight trending down, continue Lasix, potassium, repeat BMET in AM Deconditioning- patient resident of [facility], wife has dementia will be staying in SNF side, TOC aware and is working on this Dispo- patient stable, remains tachycardic despite titration of BB, BP improved with use of Cozaar may need to increase prior to discharge, will d/c EPW, continue diuretics, if remains clinically stable likely ready for d/c in next 24-48 hours   LOS: 4 days    Ellwood Handler, PA-C 06/11/2021 Patient seen and examined, agree with above Still tachycardic and hypertensive- increase Lopressor to 50 BID Will check ECG to r/o flutter although appeared to ST on monitor  Devyne Hauger C. Roxan Hockey, MD [facility] [phone]

## 2021-06-11 NOTE — Progress Notes (Signed)
Troponin 5931. Notified MD. Will continue to monitor.  Era Bumpers, RN

## 2021-06-11 NOTE — Progress Notes (Signed)
CARDIAC REHAB PHASE I   Went to offer to walk with pt. Pts HR 130s ST while lying in bed. RN at bedside giving medications. Will rate for medication to work and HR lower before ambulation. Pt denies needs or concerns at this time. Will f/u later.  Rufina Falco, RN BSN 06/11/2021 8:58 AM

## 2021-06-11 NOTE — Plan of Care (Signed)
NPO after midnight, cardiac cath planned tomorrow.

## 2021-06-11 NOTE — Progress Notes (Addendum)
PT Cancellation Note  Patient Details Name: Chad Washington MRN: [id number] DOB: 04-06-1941   Cancelled Treatment:    Reason Eval/Treat Not Completed: Medical issues which prohibited therapy per RN patient with recent EKG changes and checking troponin levels. Will hold and follow up as pt medically appropriate.   1520: checked on pt second time, and troponins elevated and not appropriate to work with PT. Will hold until pt medically appropriate.    Reuel Derby, PT, DPT  Acute Rehabilitation Services  Pager: [phone] Office: [phone]    Rudean Hitt 06/11/2021, 1:17 PM

## 2021-06-11 NOTE — Progress Notes (Signed)
      [address]       [place] [address]             [phone]      I did a 12 lead ECG this AM to further evaluate what appeared to br ST on the monitor  ECG showed ST elevation anteriorly. Surprising in that he was not complaining of any Cp at the time. Asked Cardiology to see and appreciate their assistance.  Initial troponin 5900  Will defer timing of cath to Cardiology  (Note interrupted by power outage)  Remo Lipps C. Roxan Hockey, MD [facility] [phone]

## 2021-06-11 NOTE — TOC Initial Note (Addendum)
Transition of Care [facility]) - Initial/Assessment Note    Patient Details  Name: Chad Washington MRN: [id number] Date of Birth: 07/28/1941  [facility] of [facility] CM/SW Contact:    Bethann Berkshire, [place] Phone Number: 06/11/2021, 10:09 AM  Clinical Narrative:                  CSW met with pt to discuss disposition. Pt clarifies that he is from [facility] and wants to go to their SNF (it was previously noted that pt is from [place] which is incorrect). He lives at [place] with his wife. CSW confirmed with Claiborne Billings at [facility] that pt is from there and that they would admit him at their SNF facility at discharge. TOC will start auth if managed by [employer]. PT/OT evals will be needed for auth. RN is notified of need for PT/OT. FL2 complete and sent to [facility] in [place].   [facility] does not admit during weekends.   Expected Discharge Plan: [place] Barriers to Discharge: Continued Medical Work up   Patient Goals and CMS Choice Patient states their goals for this hospitalization and ongoing recovery are:: [facility] SNF for rehab   Choice offered to / list presented to : Patient  Expected Discharge Plan and Services Expected Discharge Plan: [place] Choice: [place] Living arrangements for the past 2 months: [place]                                      Prior Living Arrangements/Services Living arrangements for the past 2 months: [place] Lives with:: Spouse                   Activities of Daily Living Home Assistive Devices/Equipment: None (some home equipment for his wife) ADL Screening (condition at time of admission) Patient's cognitive ability adequate to safely complete daily activities?: Yes Is the patient deaf or have difficulty hearing?: No Does the patient have difficulty seeing, even when wearing glasses/contacts?:  No Does the patient have difficulty concentrating, remembering, or making decisions?: No Patient able to express need for assistance with ADLs?: Yes Does the patient have difficulty dressing or bathing?: No Independently performs ADLs?: Yes (appropriate for developmental age) Does the patient have difficulty walking or climbing stairs?: No Weakness of Legs: None Weakness of Arms/Hands: None  Permission Sought/Granted                  Emotional Assessment Appearance:: Appears stated age Attitude/Demeanor/Rapport: Engaged Affect (typically observed): Accepting Orientation: : Oriented to Self, Oriented to Place, Oriented to  Time, Oriented to Situation Alcohol / Substance Use: Not Applicable Psych Involvement: No (comment)  Admission diagnosis:  Status post surgery [Z98.890] S/P CABG x 4 [Z95.1] Patient Active Problem List   Diagnosis Date Noted   Status post surgery 06/07/2021   Acute ST elevation myocardial infarction (STEMI) of anterior wall (HCC) 06/07/2021   S/P CABG x 4 06/07/2021   PCP:  [name], MD Pharmacy:   [id number] [place] [id number]: [phone]: [phone]    Social Determinants of Health (SDOH) Interventions    Readmission Risk Interventions No flowsheet data found.

## 2021-06-12 ENCOUNTER — Encounter (HOSPITAL_COMMUNITY)
Admission: EM | Disposition: A | Discharge status: Skilled Nursing Facility | Payer: Self-pay | Source: Home / Self Care | Attending: Thoracic Surgery (Cardiothoracic Vascular Surgery)

## 2021-06-12 DIAGNOSIS — E785 Hyperlipidemia, unspecified: Secondary | ICD-10-CM

## 2021-06-12 DIAGNOSIS — Z951 Presence of aortocoronary bypass graft: Secondary | ICD-10-CM | POA: Diagnosis not present

## 2021-06-12 DIAGNOSIS — I2510 Atherosclerotic heart disease of native coronary artery without angina pectoris: Secondary | ICD-10-CM | POA: Diagnosis not present

## 2021-06-12 DIAGNOSIS — I501 Left ventricular failure: Secondary | ICD-10-CM

## 2021-06-12 DIAGNOSIS — I255 Ischemic cardiomyopathy: Secondary | ICD-10-CM

## 2021-06-12 DIAGNOSIS — R931 Abnormal findings on diagnostic imaging of heart and coronary circulation: Secondary | ICD-10-CM

## 2021-06-12 HISTORY — PX: LEFT HEART CATH AND CORS/GRAFTS ANGIOGRAPHY: CATH118250

## 2021-06-12 LAB — BASIC METABOLIC PANEL
Anion gap: 6 (ref 5–15)
BUN: 15 mg/dL (ref 8–23)
CO2: 27 mmol/L (ref 22–32)
Calcium: 8.2 mg/dL — ABNORMAL LOW (ref 8.9–10.3)
Chloride: 107 mmol/L (ref 98–111)
Creatinine, Ser: 0.83 mg/dL (ref 0.61–1.24)
GFR, Estimated: >60 mL/min (ref >60)
Glucose, Bld: 104 mg/dL — ABNORMAL HIGH (ref 70–99)
Potassium: 4.6 mmol/L (ref 3.5–5.1)
Sodium: 140 mmol/L (ref 135–145)

## 2021-06-12 SURGERY — LEFT HEART CATH AND CORS/GRAFTS ANGIOGRAPHY
Anesthesia: LOCAL

## 2021-06-12 MED ORDER — HEPARIN SODIUM (PORCINE) 1000 UNIT/ML IJ SOLN
INTRAMUSCULAR | Status: AC
  Filled 2021-06-12: qty 1

## 2021-06-12 MED ORDER — HEPARIN SODIUM (PORCINE) 1000 UNIT/ML IJ SOLN
INTRAMUSCULAR | Status: DC | PRN
  Administered 2021-06-12: 4000 [IU] via INTRAVENOUS

## 2021-06-12 MED ORDER — HEPARIN (PORCINE) IN NACL 1000-0.9 UT/500ML-% IV SOLN
INTRAVENOUS | Status: AC
  Filled 2021-06-12: qty 1000

## 2021-06-12 MED ORDER — HEPARIN (PORCINE) IN NACL 1000-0.9 UT/500ML-% IV SOLN
INTRAVENOUS | Status: DC | PRN
  Administered 2021-06-12 (×2): 500 mL

## 2021-06-12 MED ORDER — LIDOCAINE HCL (PF) 1 % IJ SOLN
INTRAMUSCULAR | Status: AC
  Filled 2021-06-12: qty 30

## 2021-06-12 MED ORDER — FENTANYL CITRATE (PF) 100 MCG/2ML IJ SOLN
INTRAMUSCULAR | Status: AC
  Filled 2021-06-12: qty 2

## 2021-06-12 MED ORDER — VERAPAMIL HCL 2.5 MG/ML IV SOLN
INTRAVENOUS | Status: DC | PRN
  Administered 2021-06-12: 10 mL via INTRA_ARTERIAL

## 2021-06-12 MED ORDER — FENTANYL CITRATE (PF) 100 MCG/2ML IJ SOLN
INTRAMUSCULAR | Status: DC | PRN
  Administered 2021-06-12: 25 ug via INTRAVENOUS

## 2021-06-12 MED ORDER — MIDAZOLAM HCL 2 MG/2ML IJ SOLN
INTRAMUSCULAR | Status: AC
  Filled 2021-06-12: qty 2

## 2021-06-12 MED ORDER — VERAPAMIL HCL 2.5 MG/ML IV SOLN
INTRAVENOUS | Status: AC
  Filled 2021-06-12: qty 2

## 2021-06-12 MED ORDER — METOPROLOL TARTRATE 50 MG PO TABS
75.0000 mg | ORAL_TABLET | Freq: Two times a day (BID) | ORAL | Status: DC
  Administered 2021-06-12 – 2021-06-14 (×4): 75 mg via ORAL
  Filled 2021-06-12 (×5): qty 1

## 2021-06-12 MED ORDER — IOHEXOL 350 MG/ML SOLN
INTRAVENOUS | Status: DC | PRN
  Administered 2021-06-12: 85 mL

## 2021-06-12 MED ORDER — MIDAZOLAM HCL 2 MG/2ML IJ SOLN
INTRAMUSCULAR | Status: DC | PRN
  Administered 2021-06-12: 1 mg via INTRAVENOUS

## 2021-06-12 MED ORDER — LIDOCAINE HCL (PF) 1 % IJ SOLN
INTRAMUSCULAR | Status: DC | PRN
  Administered 2021-06-12: 2 mL

## 2021-06-12 SURGICAL SUPPLY — 10 items
CATH INFINITI 5FR MULTPACK ANG (CATHETERS) ×2 IMPLANT
DEVICE RAD COMP TR BAND LRG (VASCULAR PRODUCTS) ×2 IMPLANT
GLIDESHEATH SLEND SS 6F .021 (SHEATH) ×2 IMPLANT
GUIDEWIRE INQWIRE 1.5J.035X260 (WIRE) ×1 IMPLANT
INQWIRE 1.5J .035X260CM (WIRE) ×2
KIT HEART LEFT (KITS) ×2 IMPLANT
PACK CARDIAC CATHETERIZATION (CUSTOM PROCEDURE TRAY) ×2 IMPLANT
SHEATH PROBE COVER 6X72 (BAG) ×2 IMPLANT
TRANSDUCER W/STOPCOCK (MISCELLANEOUS) ×2 IMPLANT
TUBING CIL FLEX 10 FLL-RA (TUBING) ×6 IMPLANT

## 2021-06-12 NOTE — Progress Notes (Addendum)
      [address]       [place] [address]             [phone]      6 Days Post-Op Procedure(s) (LRB): CORONARY ARTERY BYPASS GRAFTING (CABG) TIMES FOUR, ON PUMP, USING LEFT INTERNAL MAMMARY ARTERY AND ENDOSCOPICALLY HARVESTED RIGHT GREATER SAPHENOUS VEIN (N/A) TRANSESOPHAGEAL ECHOCARDIOGRAM (TEE) ENDOVEIN HARVEST OF GREATER SAPHENOUS VEIN (Right)  Subjective:  Patient continues to have no complaints.  Denies chest pain and shortness of breath.  + ambulation  Objective: Vital signs in last 24 hours: Temp:  [98 F (36.7 C)-99.1 F (37.3 C)] 98.1 F (36.7 C) (06/17 0416) Pulse Rate:  [102-120] 107 (06/17 0416) Cardiac Rhythm: Sinus tachycardia (06/16 2300) Resp:  [19-20] 20 (06/17 0416) BP: (104-140)/(73-95) 140/95 (06/17 0416) SpO2:  [91 %-99 %] 91 % (06/17 0416) Weight:  [78.4 kg] 78.4 kg (06/17 0500)   General appearance: alert, cooperative, and no distress Heart: regular rate and rhythm and tachy Lungs: clear to auscultation bilaterally Abdomen: soft, non-tender; bowel sounds normal; no masses,  no organomegaly Extremities: edema trace Wound: clean and dry  Lab Results: Recent Labs    06/10/21 0054  WBC 14.2*  HGB 12.5*  HCT 36.0*  PLT 129*   BMET:  Recent Labs    06/10/21 0054 06/12/21 0054  NA 137 140  K 3.9 4.6  CL 104 107  CO2 24 27  GLUCOSE 109* 104*  BUN 15 15  CREATININE 0.70 0.83  CALCIUM 8.5* 8.2*    PT/INR: No results for input(s): LABPROT, INR in the last 72 hours. ABG    Component Value Date/Time   PHART 7.292 (L) 06/07/2021 1640   HCO3 22.6 06/07/2021 1640   TCO2 24 06/07/2021 1640   ACIDBASEDEF 4.0 (H) 06/07/2021 1640   O2SAT 71.3 06/07/2021 2006   CBG (last 3)  Recent Labs    06/09/21 1614 06/09/21 2138 06/10/21 0623  GLUCAP 118* 114* 94    Assessment/Plan: S/P Procedure(s) (LRB): CORONARY ARTERY BYPASS GRAFTING (CABG) TIMES FOUR, ON PUMP, USING LEFT INTERNAL MAMMARY ARTERY AND ENDOSCOPICALLY  HARVESTED RIGHT GREATER SAPHENOUS VEIN (N/A) TRANSESOPHAGEAL ECHOCARDIOGRAM (TEE) ENDOVEIN HARVEST OF GREATER SAPHENOUS VEIN (Right)  CV- Persistent Sinus Tachycardia- on Lopressor, Cozaar.. Cardiology consult requested, Echo obtained showed decrease in EF with decrease motion in anterior wall, felt cardiac catheterization would be indicated to evaluate recently placed bypass grafts Pulm- no acute issues, no oxygen continue IS Renal- creatinine, stable weight is trending down, on Lasix, potassium Deconditioning- patient will [facility] to [facility] SN[facility] side, TOC is aware Dispo- patient stable, remains chest pain free, plan for cardiac catheterization today with Dr. Ellyn Hack  LOS: 5 days    Ellwood Handler, PA-C 06/12/2021 Patient seen and examined, agree with above He looks and feels well. For cath later today  Remo Lipps C. Roxan Hockey, MD [facility] [phone]

## 2021-06-12 NOTE — Plan of Care (Signed)
  Problem: Education: Goal: Will demonstrate proper wound care and an understanding of methods to prevent future damage Outcome: Progressing Goal: Knowledge of disease or condition will improve Outcome: Progressing Goal: Knowledge of the prescribed therapeutic regimen will improve Outcome: Progressing Goal: Individualized Educational Video(s) Outcome: Progressing

## 2021-06-12 NOTE — H&P (View-Only) (Signed)
Progress Note  Patient Name: Chad Washington Date of Encounter: 06/12/2021  [facility] Cardiologist: [name], MD   Subjective   Postop day 6 CABG x4 in the setting of STEMI.  He is progressing nicely although he does have sinus tachycardia.  He denies chest pain.  His EKG did show changes in the anterior precordial leads and echo showed decline in LV function with worsening wall motion abnormalities.  Inpatient Medications    Scheduled Meds:  acetaminophen  1,000 mg Oral Q6H   Or   acetaminophen (TYLENOL) oral liquid 160 mg/5 mL  1,000 mg Per Tube Q6H   aspirin EC  325 mg Oral Daily   Or   aspirin  324 mg Per Tube Daily   atorvastatin  40 mg Oral Daily   bisacodyl  10 mg Oral Daily   Or   bisacodyl  10 mg Rectal Daily   budesonide  0.25 mg Nebulization BID   Chlorhexidine Gluconate Cloth  6 each Topical Daily   docusate sodium  200 mg Oral Daily   guaiFENesin  600 mg Oral BID   ketotifen  1 drop Both Eyes BID   losartan  25 mg Oral Daily   mouth rinse  15 mL Mouth Rinse BID   metoprolol tartrate  50 mg Oral BID   montelukast  10 mg Oral q1800   pantoprazole  40 mg Oral Daily   sodium chloride flush  3 mL Intravenous Q12H   sodium chloride flush  3 mL Intravenous Q12H   Continuous Infusions:  sodium chloride     sodium chloride     sodium chloride 10 mL/hr at 06/12/21 0624   PRN Meds: sodium chloride, sodium chloride, alum & mag hydroxide-simeth, magnesium hydroxide, melatonin, metoprolol tartrate, ondansetron (ZOFRAN) IV, oxyCODONE, sodium chloride flush, sodium chloride flush, traMADol   Vital Signs    Vitals:   06/12/21 0500 06/12/21 0803 06/12/21 0857 06/12/21 0901  BP:  (!) 137/95  118/80  Pulse:  (!) 111  76  Resp:  20  20  Temp:  98.6 F (37 C)  99 F (37.2 C)  TempSrc:  Oral  Oral  SpO2:  91% 95% 96%  Weight: 78.4 kg     Height:        Intake/Output Summary (Last 24 hours) at 06/12/2021 0947 Last data filed at 06/12/2021 0805 Gross  per 24 hour  Intake 0 ml  Output 300 ml  Net -300 ml   Last 3 Weights 06/12/2021 06/11/2021 06/10/2021  Weight (lbs) 172 lb 14.4 oz 175 lb 4.8 oz 179 lb 1.6 oz  Weight (kg) 78.427 kg 79.516 kg 81.239 kg      Telemetry    Sinus tachycardia- Personally Reviewed  ECG    Sinus tachycardia at 127 with septal Q waves and persistent anterior ST segment elevation.- Personally Reviewed  Physical Exam   GEN: No acute distress.   Neck: No JVD Cardiac: RRR, no murmurs, rubs, or gallops.  Respiratory: Clear to auscultation bilaterally. GI: Soft, nontender, non-distended  MS: No edema; No deformity. Neuro:  Nonfocal  Psych: Normal affect   Labs    High Sensitivity Troponin:   Recent Labs  Lab 06/06/21 2252 06/06/21 2355 06/11/21 1135 06/11/21 1317  TROPONINIHS 233* 785* 5,931* 5,414*      Chemistry Recent Labs  Lab 06/06/21 2252 06/06/21 2355 06/07/21 0028 06/09/21 0507 06/10/21 0054 06/12/21 0054  NA 138 137   < > 136 137 140  K 3.7 3.5   < >  3.6 3.9 4.6  CL 105 105   < > 102 104 107  CO2 26 23   < > 27 24 27   GLUCOSE 109* 140*   < > 123* 109* 104*  BUN 16 13   < > 17 15 15   CREATININE 0.93 0.83   < > 0.81 0.70 0.83  CALCIUM 8.9 8.8*   < > 8.4* 8.5* 8.2*  PROT 6.6 6.3*  --   --   --   --   ALBUMIN 4.0 3.5  --   --   --   --   AST 39 38  --   --   --   --   ALT 32 29  --   --   --   --   ALKPHOS 39 39  --   --   --   --   BILITOT 0.7 0.7  --   --   --   --   GFRNONAA >60 >60   < > >60 >60 >60  ANIONGAP 7 9   < > 7 9 6    < > = values in this interval not displayed.     Hematology Recent Labs  Lab 06/08/21 1537 06/09/21 0507 06/10/21 0054  WBC 13.5* 12.7* 14.2*  RBC 3.54* 3.46* 3.85*  HGB 11.6* 11.2* 12.5*  HCT 34.8* 33.6* 36.0*  MCV 98.3 97.1 93.5  MCH 32.8 32.4 32.5  MCHC 33.3 33.3 34.7  RDW 15.0 14.7 14.3  PLT 99* 101* 129*    BNPNo results for input(s): BNP, PROBNP in the last 168 hours.   DDimer No results for input(s): DDIMER in the last 168  hours.   Radiology    ECHOCARDIOGRAM LIMITED  Result Date: 06/11/2021    ECHOCARDIOGRAM LIMITED REPORT   Patient Name:   Chad Washington Date of Exam: 06/11/2021 Medical Rec #:  [id number]           Height:       72.0 in Accession #:    [id number]          Weight:       175.3 lb Date of Birth:  24-Oct-1941           BSA:          2.014 m Patient Age:    80 years            BP:           107/73 mmHg Patient Gender: M                   HR:           110 bpm. Exam Location:  Inpatient Procedure: Limited Echo, Color Doppler and Cardiac Doppler STAT ECHO Indications:    R94.31 Abnormal EKG  History:        Patient has prior history of Echocardiogram examinations, most                 recent 06/07/2021. Prior CABG.  Sonographer:    Raquel Sarna Senior RDCS Referring Phys: [id number] [place]  1. Left ventricular ejection fraction, by estimation, is 35 to 40%. The left ventricle has moderately decreased function. The left ventricle demonstrates regional wall motion abnormalities (see scoring diagram/findings for description). There is akinesis of the left ventricular, apical segment. There is akinesis of the left ventricular, apical septal wall, lateral wall, anterior wall and inferolateral wall. There is hypokinesis of the left ventricular, basal-mid anterior wall.  2.  Right ventricular systolic function is normal. The right ventricular size is normal.  3. The mitral valve is normal in structure. No evidence of mitral valve regurgitation. No evidence of mitral stenosis.  4. The aortic valve is normal in structure. Aortic valve regurgitation is not visualized. No aortic stenosis is present.  5. The inferior vena cava is normal in size with greater than 50% respiratory variability, suggesting right atrial pressure of 3 mmHg. FINDINGS  Left Ventricle: Left ventricular ejection fraction, by estimation, is 35 to 40%. The left ventricle has moderately decreased function. The left ventricle demonstrates  regional wall motion abnormalities. The left ventricular internal cavity size was normal in size. There is no left ventricular hypertrophy. Right Ventricle: The right ventricular size is normal. No increase in right ventricular wall thickness. Right ventricular systolic function is normal. Left Atrium: Left atrial size was normal in size. Right Atrium: Right atrial size was normal in size. Pericardium: There is no evidence of pericardial effusion. Mitral Valve: The mitral valve is normal in structure. No evidence of mitral valve stenosis. Tricuspid Valve: The tricuspid valve is normal in structure. Tricuspid valve regurgitation is trivial. No evidence of tricuspid stenosis. Aortic Valve: The aortic valve is normal in structure. Aortic valve regurgitation is not visualized. No aortic stenosis is present. Pulmonic Valve: The pulmonic valve was normal in structure. Pulmonic valve regurgitation is not visualized. No evidence of pulmonic stenosis. Aorta: The aortic root is normal in size and structure. Venous: The inferior vena cava is normal in size with greater than 50% respiratory variability, suggesting right atrial pressure of 3 mmHg. IAS/Shunts: No atrial level shunt detected by color flow Doppler. RIGHT VENTRICLE TAPSE (M-mode): 0.9 cm Fransico Him MD Electronically signed by Fransico Him MD Signature Date/Time: 06/11/2021/2:46:55 PM    Final     Cardiac Studies   2D echocardiogram (06/11/2021)  IMPRESSIONS     1. Left ventricular ejection fraction, by estimation, is 35 to 40%. The  left ventricle has moderately decreased function. The left ventricle  demonstrates regional wall motion abnormalities (see scoring  diagram/findings for description). There is  akinesis of the left ventricular, apical segment. There is akinesis of the  left ventricular, apical septal wall, lateral wall, anterior wall and  inferolateral wall. There is hypokinesis of the left ventricular,  basal-mid anterior wall.   2.  Right ventricular systolic function is normal. The right ventricular  size is normal.   3. The mitral valve is normal in structure. No evidence of mitral valve  regurgitation. No evidence of mitral stenosis.   4. The aortic valve is normal in structure. Aortic valve regurgitation is  not visualized. No aortic stenosis is present.   5. The inferior vena cava is normal in size with greater than 50%  respiratory variability, suggesting right atrial pressure of 3 mmHg.     Cardiac catheterization (06/06/2021)  Conclusion    Ramus lesion is 85% stenosed. Dist LM to Ost LAD lesion is 95% stenosed. Ost Cx lesion is 95% stenosed. Prox LAD lesion is 95% stenosed. 1st Diag lesion is 70% stenosed. Prox RCA lesion is 30% stenosed. RPDA lesion is 50% stenosed.   Severe 95% distal left main stenosis percent proximal LAD stenosis, 70% diffuse first diagonal stenosis, 95% ostial left circumflex stenosis.  The RCA has mild luminal irregularity with narrowings of 20 and 30%.  There is a 50% ostial narrowing and a small PDA vessel.   Acute LV dysfunction with EF estimate approximately 50% with mild distal anterior  lateral/apical hypocontractility.  LVEDP 23 mmHg.   Insertion of intra-aortic balloon pump for hemodynamic support prior to emergent CaBG surgery this evening.   RECOMMENDATION: The patient will be taken to the operating room by Dr. Modesto Charon emergently for critical left main stenosis with subtotal LAD and ostial circumflex stenoses.  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 Claiborne Billings on 06/06/2021 revealing left main and two-vessel disease.  He underwent placement of an intra-aortic balloon pump and urgent CABG by Dr. Roxan Hockey with a LIMA to the LAD, vein to the diagonal and sequential vein to 2 marginal branches.  He has progressed as  expected however recently he was noted to have persistent sinus tachycardia with EKG changes in the anterior precordial leads and 2D echo that showed decline in EF with worsening wall motion abnormalities in the LAD distribution along with elevated troponins.  Assessment & Plan    1: Coronary artery disease-postop day 6 CABG x4 in the setting of anterior STEMI.  In intra-aortic balloon pump was placed for coronary perfusion.  He had a LIMA to his LAD, vein to diagonal branch and sequential vein to 2 marginal branches by Dr. Roxan Hockey.  He has been progressing nicely.  He was having sinus tachycardia for unclear reasons on EKG that showed anterior ST segment elevation.  His troponins were elevated at 5000 and echo showed a decline in LV function down to 35% with worsening anterior wall motion abnormalities.  Based on this, concern was raised about a technical issue with one of his grafts and therefore it was decided to proceed with diagnostic coronary angiography today.  2: LV dysfunction-ischemic cardiomyopathy with worsening LV function from the immediate postop TEE where his EF was in the 45 to 50% range down to 35% by transthoracic echo yesterday with worsening anterior wall motion abnormality.  Scheduled for cardiac cath today.  3: Hyperlipidemia-LDL 122 on admission, on high-dose statin therapy.     For questions or updates, please contact [place] Please consult [email or web address] for contact info under        Signed, Quay Burow, MD  06/12/2021, 9:47 AM

## 2021-06-12 NOTE — Progress Notes (Signed)
Progress Note  Patient Name: Chad Washington Date of Encounter: 06/12/2021  [facility] Cardiologist: [name], MD   Subjective   Postop day 6 CABG x4 in the setting of STEMI.  He is progressing nicely although he does have sinus tachycardia.  He denies chest pain.  His EKG did show changes in the anterior precordial leads and echo showed decline in LV function with worsening wall motion abnormalities.  Inpatient Medications    Scheduled Meds:  acetaminophen  1,000 mg Oral Q6H   Or   acetaminophen (TYLENOL) oral liquid 160 mg/5 mL  1,000 mg Per Tube Q6H   aspirin EC  325 mg Oral Daily   Or   aspirin  324 mg Per Tube Daily   atorvastatin  40 mg Oral Daily   bisacodyl  10 mg Oral Daily   Or   bisacodyl  10 mg Rectal Daily   budesonide  0.25 mg Nebulization BID   Chlorhexidine Gluconate Cloth  6 each Topical Daily   docusate sodium  200 mg Oral Daily   guaiFENesin  600 mg Oral BID   ketotifen  1 drop Both Eyes BID   losartan  25 mg Oral Daily   mouth rinse  15 mL Mouth Rinse BID   metoprolol tartrate  50 mg Oral BID   montelukast  10 mg Oral q1800   pantoprazole  40 mg Oral Daily   sodium chloride flush  3 mL Intravenous Q12H   sodium chloride flush  3 mL Intravenous Q12H   Continuous Infusions:  sodium chloride     sodium chloride     sodium chloride 10 mL/hr at 06/12/21 0624   PRN Meds: sodium chloride, sodium chloride, alum & mag hydroxide-simeth, magnesium hydroxide, melatonin, metoprolol tartrate, ondansetron (ZOFRAN) IV, oxyCODONE, sodium chloride flush, sodium chloride flush, traMADol   Vital Signs    Vitals:   06/12/21 0500 06/12/21 0803 06/12/21 0857 06/12/21 0901  BP:  (!) 137/95  118/80  Pulse:  (!) 111  76  Resp:  20  20  Temp:  98.6 F (37 C)  99 F (37.2 C)  TempSrc:  Oral  Oral  SpO2:  91% 95% 96%  Weight: 78.4 kg     Height:        Intake/Output Summary (Last 24 hours) at 06/12/2021 0947 Last data filed at 06/12/2021 0805 Gross  per 24 hour  Intake 0 ml  Output 300 ml  Net -300 ml   Last 3 Weights 06/12/2021 06/11/2021 06/10/2021  Weight (lbs) 172 lb 14.4 oz 175 lb 4.8 oz 179 lb 1.6 oz  Weight (kg) 78.427 kg 79.516 kg 81.239 kg      Telemetry    Sinus tachycardia- Personally Reviewed  ECG    Sinus tachycardia at 127 with septal Q waves and persistent anterior ST segment elevation.- Personally Reviewed  Physical Exam   GEN: No acute distress.   Neck: No JVD Cardiac: RRR, no murmurs, rubs, or gallops.  Respiratory: Clear to auscultation bilaterally. GI: Soft, nontender, non-distended  MS: No edema; No deformity. Neuro:  Nonfocal  Psych: Normal affect   Labs    High Sensitivity Troponin:   Recent Labs  Lab 06/06/21 2252 06/06/21 2355 06/11/21 1135 06/11/21 1317  TROPONINIHS 233* 785* 5,931* 5,414*      Chemistry Recent Labs  Lab 06/06/21 2252 06/06/21 2355 06/07/21 0028 06/09/21 0507 06/10/21 0054 06/12/21 0054  NA 138 137   < > 136 137 140  K 3.7 3.5   < >  3.6 3.9 4.6  CL 105 105   < > 102 104 107  CO2 26 23   < > 27 24 27   GLUCOSE 109* 140*   < > 123* 109* 104*  BUN 16 13   < > 17 15 15   CREATININE 0.93 0.83   < > 0.81 0.70 0.83  CALCIUM 8.9 8.8*   < > 8.4* 8.5* 8.2*  PROT 6.6 6.3*  --   --   --   --   ALBUMIN 4.0 3.5  --   --   --   --   AST 39 38  --   --   --   --   ALT 32 29  --   --   --   --   ALKPHOS 39 39  --   --   --   --   BILITOT 0.7 0.7  --   --   --   --   GFRNONAA >60 >60   < > >60 >60 >60  ANIONGAP 7 9   < > 7 9 6    < > = values in this interval not displayed.     Hematology Recent Labs  Lab 06/08/21 1537 06/09/21 0507 06/10/21 0054  WBC 13.5* 12.7* 14.2*  RBC 3.54* 3.46* 3.85*  HGB 11.6* 11.2* 12.5*  HCT 34.8* 33.6* 36.0*  MCV 98.3 97.1 93.5  MCH 32.8 32.4 32.5  MCHC 33.3 33.3 34.7  RDW 15.0 14.7 14.3  PLT 99* 101* 129*    BNPNo results for input(s): BNP, PROBNP in the last 168 hours.   DDimer No results for input(s): DDIMER in the last 168  hours.   Radiology    ECHOCARDIOGRAM LIMITED  Result Date: 06/11/2021    ECHOCARDIOGRAM LIMITED REPORT   Patient Name:   Chad Washington Date of Exam: 06/11/2021 Medical Rec #:  [id number]           Height:       72.0 in Accession #:    [id number]          Weight:       175.3 lb Date of Birth:  10/01/1941           BSA:          2.014 m Patient Age:    80 years            BP:           107/73 mmHg Patient Gender: M                   HR:           110 bpm. Exam Location:  Inpatient Procedure: Limited Echo, Color Doppler and Cardiac Doppler STAT ECHO Indications:    R94.31 Abnormal EKG  History:        Patient has prior history of Echocardiogram examinations, most                 recent 06/07/2021. Prior CABG.  Sonographer:    Raquel Sarna Senior RDCS Referring Phys: [id number] [place]  1. Left ventricular ejection fraction, by estimation, is 35 to 40%. The left ventricle has moderately decreased function. The left ventricle demonstrates regional wall motion abnormalities (see scoring diagram/findings for description). There is akinesis of the left ventricular, apical segment. There is akinesis of the left ventricular, apical septal wall, lateral wall, anterior wall and inferolateral wall. There is hypokinesis of the left ventricular, basal-mid anterior wall.  2.  Right ventricular systolic function is normal. The right ventricular size is normal.  3. The mitral valve is normal in structure. No evidence of mitral valve regurgitation. No evidence of mitral stenosis.  4. The aortic valve is normal in structure. Aortic valve regurgitation is not visualized. No aortic stenosis is present.  5. The inferior vena cava is normal in size with greater than 50% respiratory variability, suggesting right atrial pressure of 3 mmHg. FINDINGS  Left Ventricle: Left ventricular ejection fraction, by estimation, is 35 to 40%. The left ventricle has moderately decreased function. The left ventricle demonstrates  regional wall motion abnormalities. The left ventricular internal cavity size was normal in size. There is no left ventricular hypertrophy. Right Ventricle: The right ventricular size is normal. No increase in right ventricular wall thickness. Right ventricular systolic function is normal. Left Atrium: Left atrial size was normal in size. Right Atrium: Right atrial size was normal in size. Pericardium: There is no evidence of pericardial effusion. Mitral Valve: The mitral valve is normal in structure. No evidence of mitral valve stenosis. Tricuspid Valve: The tricuspid valve is normal in structure. Tricuspid valve regurgitation is trivial. No evidence of tricuspid stenosis. Aortic Valve: The aortic valve is normal in structure. Aortic valve regurgitation is not visualized. No aortic stenosis is present. Pulmonic Valve: The pulmonic valve was normal in structure. Pulmonic valve regurgitation is not visualized. No evidence of pulmonic stenosis. Aorta: The aortic root is normal in size and structure. Venous: The inferior vena cava is normal in size with greater than 50% respiratory variability, suggesting right atrial pressure of 3 mmHg. IAS/Shunts: No atrial level shunt detected by color flow Doppler. RIGHT VENTRICLE TAPSE (M-mode): 0.9 cm Fransico Him MD Electronically signed by Fransico Him MD Signature Date/Time: 06/11/2021/2:46:55 PM    Final     Cardiac Studies   2D echocardiogram (06/11/2021)  IMPRESSIONS     1. Left ventricular ejection fraction, by estimation, is 35 to 40%. The  left ventricle has moderately decreased function. The left ventricle  demonstrates regional wall motion abnormalities (see scoring  diagram/findings for description). There is  akinesis of the left ventricular, apical segment. There is akinesis of the  left ventricular, apical septal wall, lateral wall, anterior wall and  inferolateral wall. There is hypokinesis of the left ventricular,  basal-mid anterior wall.   2.  Right ventricular systolic function is normal. The right ventricular  size is normal.   3. The mitral valve is normal in structure. No evidence of mitral valve  regurgitation. No evidence of mitral stenosis.   4. The aortic valve is normal in structure. Aortic valve regurgitation is  not visualized. No aortic stenosis is present.   5. The inferior vena cava is normal in size with greater than 50%  respiratory variability, suggesting right atrial pressure of 3 mmHg.     Cardiac catheterization (06/06/2021)  Conclusion    Ramus lesion is 85% stenosed. Dist LM to Ost LAD lesion is 95% stenosed. Ost Cx lesion is 95% stenosed. Prox LAD lesion is 95% stenosed. 1st Diag lesion is 70% stenosed. Prox RCA lesion is 30% stenosed. RPDA lesion is 50% stenosed.   Severe 95% distal left main stenosis percent proximal LAD stenosis, 70% diffuse first diagonal stenosis, 95% ostial left circumflex stenosis.  The RCA has mild luminal irregularity with narrowings of 20 and 30%.  There is a 50% ostial narrowing and a small PDA vessel.   Acute LV dysfunction with EF estimate approximately 50% with mild distal anterior  lateral/apical hypocontractility.  LVEDP 23 mmHg.   Insertion of intra-aortic balloon pump for hemodynamic support prior to emergent CaBG surgery this evening.   RECOMMENDATION: The patient will be taken to the operating room by Dr. Modesto Charon emergently for critical left main stenosis with subtotal LAD and ostial circumflex stenoses.  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 Claiborne Billings on 06/06/2021 revealing left main and two-vessel disease.  He underwent placement of an intra-aortic balloon pump and urgent CABG by Dr. Roxan Hockey with a LIMA to the LAD, vein to the diagonal and sequential vein to 2 marginal branches.  He has progressed as  expected however recently he was noted to have persistent sinus tachycardia with EKG changes in the anterior precordial leads and 2D echo that showed decline in EF with worsening wall motion abnormalities in the LAD distribution along with elevated troponins.  Assessment & Plan    1: Coronary artery disease-postop day 6 CABG x4 in the setting of anterior STEMI.  In intra-aortic balloon pump was placed for coronary perfusion.  He had a LIMA to his LAD, vein to diagonal branch and sequential vein to 2 marginal branches by Dr. Roxan Hockey.  He has been progressing nicely.  He was having sinus tachycardia for unclear reasons on EKG that showed anterior ST segment elevation.  His troponins were elevated at 5000 and echo showed a decline in LV function down to 35% with worsening anterior wall motion abnormalities.  Based on this, concern was raised about a technical issue with one of his grafts and therefore it was decided to proceed with diagnostic coronary angiography today.  2: LV dysfunction-ischemic cardiomyopathy with worsening LV function from the immediate postop TEE where his EF was in the 45 to 50% range down to 35% by transthoracic echo yesterday with worsening anterior wall motion abnormality.  Scheduled for cardiac cath today.  3: Hyperlipidemia-LDL 122 on admission, on high-dose statin therapy.     For questions or updates, please contact [place] Please consult [email or web address] for contact info under        Signed, Quay Burow, MD  06/12/2021, 9:47 AM

## 2021-06-12 NOTE — Progress Notes (Signed)
    Per discussion with Dr. Gwenlyn Found, cath with patent grafts. Official report pending. Recommendations to further increase metoprolol from 50 to 75mg  BID for ongoing tachycardia.   SignedReino Bellis, NP-C 06/12/2021, 2:30 PM

## 2021-06-12 NOTE — Interval H&P Note (Signed)
History and Physical Interval Note:  06/12/2021 1:21 PM  Chad Washington  has presented today for surgery, with the diagnosis of chest pain, abnormal EKG & Echo post-op CABG.  The various methods of treatment have been discussed with the patient and family. After consideration of risks, benefits and other options for treatment, the patient has consented to  Procedure(s): LEFT HEART CATH AND CORS/GRAFTS ANGIOGRAPHY (N/A)  PERCUTANEOUS CORONARY INTERVENTION   as a surgical intervention.  The patient's history has been reviewed, patient examined, no change in status, stable for surgery.  I have reviewed the patient's chart and labs.  Questions were answered to the patient's satisfaction.     Cath Lab Visit (complete for each Cath Lab visit)  Clinical Evaluation Leading to the Procedure:   ACS: Yes.    Non-ACS:    Anginal Classification: No Symptoms  Anti-ischemic medical therapy: Minimal Therapy (1 class of medications)  Non-Invasive Test Results: Equivocal test results worsening EF and wall motion abnormality on echocardiogram  Prior CABG: Previous CABG   Glenetta Hew

## 2021-06-12 NOTE — Plan of Care (Signed)
  Problem: Respiratory: Goal: Respiratory status will improve Outcome: Progressing   Problem: Skin Integrity: Goal: Risk for impaired skin integrity will decrease Outcome: Progressing

## 2021-06-12 NOTE — Evaluation (Signed)
Occupational Therapy Evaluation Patient Details Name: Chad Washington MRN: [id number] DOB: [birth year] Today's Date: 06/12/2021    History of Present Illness Pt is a 80 y/o male admitted 06/06/21 with chest pain conerning for STEMI. S/P CABG x 4 on 06/07/21.  Plan for cardiac cath 6/17 due to sinus tachycardia and elevated troponins.  PMH includes: asthma, GERD.   Clinical Impression   PTA patient independent and driving. Admitted for above and presenting with problem list below, including sternal precautions, decreased activity tolerance, generalized weakness and impaired balance. Educated on sternal precautions, ADL compensatory techniques and recommendations.  He requires min assist for transfers and up to min assist for ADLs.  HR ranged from 105-125, SpO2 97% on RA, and BP 141/98 at EOB.  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  Will follow.     Follow Up Recommendations  SNF;Supervision/Assistance - 24 hour (inital 24/7)    Equipment Recommendations  None recommended by OT    Recommendations for Other Services       Precautions / Restrictions Precautions Precautions: Sternal Precaution Booklet Issued: Yes (comment) Precaution Comments: reviewed with pt Restrictions Weight Bearing Restrictions: Yes Other Position/Activity Restrictions: sternal precautions      Mobility Bed Mobility Overal bed mobility: Needs Assistance Bed Mobility: Rolling;Sidelying to Sit;Sit to Sidelying Rolling: Supervision Sidelying to sit: Min assist     Sit to sidelying: Min guard General bed mobility comments: min assist to elevate trunk    Transfers Overall transfer level: Needs assistance Equipment used: Rolling walker (2 wheeled) Transfers: Sit to/from Stand Sit to Stand: Min assist         General  transfer comment: cueing for hand placement on knees, use of momentum, and min assist to power up and steady    Balance Overall balance assessment: Needs assistance Sitting-balance support: No upper extremity supported;Feet supported Sitting balance-Leahy Scale: Good     Standing balance support: Bilateral upper extremity supported;During functional activity Standing balance-Leahy Scale: Poor Standing balance comment: relies on BUE support                           ADL either performed or assessed with clinical judgement   ADL Overall ADL's : Needs assistance/impaired     Grooming: Set up;Sitting   Upper Body Bathing: Set up;Sitting   Lower Body Bathing: Minimal assistance;Sit to/from stand   Upper Body Dressing : Minimal assistance;Sitting   Lower Body Dressing: Minimal assistance;Sit to/from stand Lower Body Dressing Details (indicate cue type and reason): able to don/doff socks, min assist sit to stand Toilet Transfer: Minimal assistance;Ambulation;RW Toilet Transfer Details (indicate cue type and reason): simulated in room         Functional mobility during ADLs: Minimal assistance;Rolling walker General ADL Comments: pt limited by decreased activity tolerance, generalized weakness and sternal precautions     Vision   Vision Assessment?: No apparent visual deficits     Perception     Praxis      Pertinent Vitals/Pain Pain Assessment: Faces Faces Pain Scale: Hurts a little bit Pain Location: chest-incisoinal Pain Descriptors / Indicators: Discomfort Pain Intervention(s): Limited activity within patient's tolerance;Monitored during session;Repositioned     Hand Dominance Left   Extremity/Trunk Assessment Upper Extremity Assessment Upper Extremity Assessment: Generalized weakness   Lower Extremity  Assessment Lower Extremity Assessment: Defer to PT evaluation       Communication Communication Communication: No difficulties   Cognition  Arousal/Alertness: Awake/alert Behavior During Therapy: WFL for tasks assessed/performed Overall Cognitive Status: Within Functional Limits for tasks assessed                                     General Comments  HR 105-125, SpO2 97% on RA, BP EOB 141/98    Exercises     Shoulder Instructions      Home Living Family/patient expects to be discharged to:: Skilled nursing facility                                 Additional Comments: from [facility]      Prior Functioning/Environment Level of Independence: Independent        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        OT Problem List: Decreased strength;Decreased activity tolerance;Impaired balance (sitting and/or standing);Decreased safety awareness;Decreased knowledge of use of DME or AE;Decreased knowledge of precautions;Cardiopulmonary status limiting activity      OT Treatment/Interventions: Self-care/ADL training;Energy conservation;DME and/or AE instruction;Therapeutic activities;Patient/family education;Balance training    OT Goals(Current goals can be found in the care plan section) Acute Rehab OT Goals Patient Stated Goal: to rehab and then back home OT Goal Formulation: With patient Time For Goal Achievement: 06/26/21 Potential to Achieve Goals: Good  OT Frequency: Min 2X/week   Barriers to D/C:            Co-evaluation              AM-PAC OT "6 Clicks" Daily Activity     Outcome Measure Help from another person eating meals?: Total (NPO) Help from another person taking care of personal grooming?: A Little Help from another person toileting, which includes using toliet, bedpan, or urinal?: A Little Help from another person bathing (including washing, rinsing, drying)?: A Little Help from another person to put on and taking off regular upper body clothing?: A Little Help from another person to put on and taking off regular lower body clothing?:  A Little 6 Click Score: 16   End of Session Equipment Utilized During Treatment: Rolling walker Nurse Communication: Mobility status  Activity Tolerance: Patient tolerated treatment well Patient left: in bed;with call bell/phone within reach  OT Visit Diagnosis: Other abnormalities of gait and mobility (R26.89);Muscle weakness (generalized) (M62.81)                Time: 8676-7209 OT Time Calculation (min): 28 min Charges:  OT General Charges $OT Visit: 1 Visit OT Evaluation $OT Eval Moderate Complexity: 1 Mod OT Treatments $Self Care/Home Management : 8-22 mins  Jolaine Artist, OT Acute Rehabilitation Services Pager [phone] Office [phone]   Delight Stare 06/12/2021, 10:29 AM

## 2021-06-12 NOTE — Progress Notes (Signed)
PT Cancellation Note  Patient Details Name: Chad Washington MRN: [id number] DOB: [birth year]   Cancelled Treatment:    Reason Eval/Treat Not Completed: Patient at procedure or test/unavailable.  Pt not in the room on arrival, suspect gone to the cath lab.  We see today as able if not on bedrest post procedure.  Otherwise will see pt 6/18. 06/12/2021  Ginger Carne., PT Acute Rehabilitation Services [phone]  (pager) [phone]  (office)   [name] 06/12/2021, 1:20 PM

## 2021-06-12 NOTE — Progress Notes (Signed)
Normal saline started per order; asa given pre cath; protonix given per patient's request.

## 2021-06-13 DIAGNOSIS — I255 Ischemic cardiomyopathy: Secondary | ICD-10-CM

## 2021-06-13 DIAGNOSIS — I5043 Acute on chronic combined systolic (congestive) and diastolic (congestive) heart failure: Secondary | ICD-10-CM | POA: Diagnosis not present

## 2021-06-13 DIAGNOSIS — Z951 Presence of aortocoronary bypass graft: Secondary | ICD-10-CM | POA: Diagnosis not present

## 2021-06-13 DIAGNOSIS — I2109 ST elevation (STEMI) myocardial infarction involving other coronary artery of anterior wall: Secondary | ICD-10-CM | POA: Diagnosis not present

## 2021-06-13 MED ORDER — FUROSEMIDE 40 MG PO TABS
40.0000 mg | ORAL_TABLET | Freq: Every day | ORAL | Status: DC
  Administered 2021-06-13 – 2021-06-14 (×2): 40 mg via ORAL
  Filled 2021-06-13 (×2): qty 1

## 2021-06-13 MED ORDER — PANTOPRAZOLE SODIUM 40 MG PO TBEC
40.0000 mg | DELAYED_RELEASE_TABLET | Freq: Every day | ORAL | Status: DC
  Administered 2021-06-14 – 2021-06-15 (×2): 40 mg via ORAL
  Filled 2021-06-13 (×4): qty 1

## 2021-06-13 NOTE — Progress Notes (Addendum)
      [address]       [place] [address]             [phone]      1 Day Post-Op Procedure(s) (LRB): LEFT HEART CATH AND CORS/GRAFTS ANGIOGRAPHY (N/A) Subjective: Feels okay this morning, he has been walking around the unit and using his incentive spirometer  Objective: Vital signs in last 24 hours: Temp:  [97.9 F (36.6 C)-99 F (37.2 C)] 98.2 F (36.8 C) (06/18 0400) Pulse Rate:  [0-123] 108 (06/18 0400) Cardiac Rhythm: Sinus tachycardia (06/17 2000) Resp:  [0-32] 20 (06/18 0400) BP: (103-159)/(61-108) 140/85 (06/18 0400) SpO2:  [0 %-98 %] 94 % (06/18 0400) Weight:  [77.3 kg] 77.3 kg (06/18 0400)     Intake/Output from previous day: 06/17 0701 - 06/18 0700 In: 154.1 [P.O.:120; I.V.:34.1] Out: 1025 [Urine:1025] Intake/Output this shift: No intake/output data recorded.  General appearance: alert, cooperative, and no distress Heart: sinus tachycardia Lungs: clear to auscultation bilaterally Abdomen: soft, non-tender; bowel sounds normal; no masses,  no organomegaly Extremities: extremities normal, atraumatic, no cyanosis or edema Wound: clean and dry  Lab Results: No results for input(s): WBC, HGB, HCT, PLT in the last 72 hours. BMET:  Recent Labs    06/12/21 0054  NA 140  K 4.6  CL 107  CO2 27  GLUCOSE 104*  BUN 15  CREATININE 0.83  CALCIUM 8.2*    PT/INR: No results for input(s): LABPROT, INR in the last 72 hours. ABG    Component Value Date/Time   PHART 7.292 (L) 06/07/2021 1640   HCO3 22.6 06/07/2021 1640   TCO2 24 06/07/2021 1640   ACIDBASEDEF 4.0 (H) 06/07/2021 1640   O2SAT 71.3 06/07/2021 2006   CBG (last 3)  No results for input(s): GLUCAP in the last 72 hours.  Assessment/Plan: S/P Procedure(s) (LRB): LEFT HEART CATH AND CORS/GRAFTS ANGIOGRAPHY (N/A)  CV- tachycardia, cardiology titrating BB. Also on cozaar, Cardiac cath yesterday shows patent grafts, reduced EF 35-45% Pulm- tolerating room air with good oxygen  saturation Renal-creatinine 0.83, electrolytes okay, weight is down to baseline H and H 12.5/36.0, stable Endo-not a diabetic, sugars stopped  Plan: EPW removed today.The patient shares with me that [facility] will not admit patients over the weekend, therefore we will plan for discharge Monday. Continue Protonix and maalox PRN for acid reflux symptoms.     LOS: 6 days    Elgie Collard 06/13/2021   I have seen and examined the patient and agree with the assessment and plan as outlined.  Looks good and ready for hospital d/c  Rexene Alberts, MD 06/13/2021 1:12 PM

## 2021-06-13 NOTE — Progress Notes (Signed)
Mobility Specialist - Progress Note   06/13/21 1626  Mobility  Activity Ambulated in hall  Level of Assistance Standby assist, set-up cues, supervision of patient - no hands on  Assistive Device Front wheel walker  Distance Ambulated (ft) 420 ft  Mobility Ambulated with assistance in hallway  Mobility Response Tolerated well  Mobility performed by Mobility specialist  $Mobility charge 1 Mobility   Pre-mobility: 106 HR, 126/89 BP During mobility: 116 HR Post-mobility: 108 HR, 132/83 BP  Pt asx throughout ambulation. Pt sitting up on edge of bed after walk, call bell at side. VSS throughout.  Pricilla Handler Mobility Specialist Mobility Specialist Phone: [phone]

## 2021-06-13 NOTE — Progress Notes (Signed)
Progress Note  Patient Name: Chad Washington Date of Encounter: 06/13/2021  [facility] Cardiologist: [name], MD   Subjective   Re-look cath yesterday for declined LVEF and tachycardia - widely patent bypass grafts. LVEF 35-40%. Plan to uptitrate BB. LVEDP 11 mmHg. 870 cc negative overnight.  Inpatient Medications    Scheduled Meds:  aspirin EC  325 mg Oral Daily   Or   aspirin  324 mg Per Tube Daily   atorvastatin  40 mg Oral Daily   bisacodyl  10 mg Oral Daily   Or   bisacodyl  10 mg Rectal Daily   budesonide  0.25 mg Nebulization BID   docusate sodium  200 mg Oral Daily   ketotifen  1 drop Both Eyes BID   losartan  25 mg Oral Daily   mouth rinse  15 mL Mouth Rinse BID   metoprolol tartrate  75 mg Oral BID   montelukast  10 mg Oral q1800   [START ON 06/14/2021] pantoprazole  40 mg Oral Daily   sodium chloride flush  3 mL Intravenous Q12H   Continuous Infusions:  sodium chloride     PRN Meds: sodium chloride, alum & mag hydroxide-simeth, magnesium hydroxide, melatonin, metoprolol tartrate, ondansetron (ZOFRAN) IV, oxyCODONE, sodium chloride flush, traMADol   Vital Signs    Vitals:   06/12/21 2352 06/13/21 0400 06/13/21 0826 06/13/21 1015  BP: 113/78 140/85 (!) 142/93   Pulse: 94 (!) 108 (!) 110 99  Resp: 20 20 20    Temp: 98.7 F (37.1 C) 98.2 F (36.8 C) 98.2 F (36.8 C)   TempSrc: Oral Oral Oral   SpO2: 96% 94% 96%   Weight:  77.3 kg    Height:        Intake/Output Summary (Last 24 hours) at 06/13/2021 1032 Last data filed at 06/13/2021 0845 Gross per 24 hour  Intake 154.1 ml  Output 845 ml  Net -690.9 ml   Last 3 Weights 06/13/2021 06/12/2021 06/11/2021  Weight (lbs) 170 lb 6.4 oz 172 lb 14.4 oz 175 lb 4.8 oz  Weight (kg) 77.293 kg 78.427 kg 79.516 kg      Telemetry    Sinus tachycardia- Personally Reviewed  ECG    N/A  Physical Exam   GEN: No acute distress.   Neck: No JVD Cardiac: RRR, no murmurs, rubs, or gallops.   Respiratory: Clear to auscultation bilaterally. GI: Soft, nontender, non-distended  MS: No edema; No deformity. Neuro:  Nonfocal  Psych: Normal affect   Labs    High Sensitivity Troponin:   Recent Labs  Lab 06/06/21 2252 06/06/21 2355 06/11/21 1135 06/11/21 1317  TROPONINIHS 233* 785* 5,931* 5,414*      Chemistry Recent Labs  Lab 06/06/21 2252 06/06/21 2355 06/07/21 0028 06/09/21 0507 06/10/21 0054 06/12/21 0054  NA 138 137   < > 136 137 140  K 3.7 3.5   < > 3.6 3.9 4.6  CL 105 105   < > 102 104 107  CO2 26 23   < > 27 24 27   GLUCOSE 109* 140*   < > 123* 109* 104*  BUN 16 13   < > 17 15 15   CREATININE 0.93 0.83   < > 0.81 0.70 0.83  CALCIUM 8.9 8.8*   < > 8.4* 8.5* 8.2*  PROT 6.6 6.3*  --   --   --   --   ALBUMIN 4.0 3.5  --   --   --   --  AST 39 38  --   --   --   --   ALT 32 29  --   --   --   --   ALKPHOS 39 39  --   --   --   --   BILITOT 0.7 0.7  --   --   --   --   GFRNONAA >60 >60   < > >60 >60 >60  ANIONGAP 7 9   < > 7 9 6    < > = values in this interval not displayed.     Hematology Recent Labs  Lab 06/08/21 1537 06/09/21 0507 06/10/21 0054  WBC 13.5* 12.7* 14.2*  RBC 3.54* 3.46* 3.85*  HGB 11.6* 11.2* 12.5*  HCT 34.8* 33.6* 36.0*  MCV 98.3 97.1 93.5  MCH 32.8 32.4 32.5  MCHC 33.3 33.3 34.7  RDW 15.0 14.7 14.3  PLT 99* 101* 129*    BNPNo results for input(s): BNP, PROBNP in the last 168 hours.   DDimer No results for input(s): DDIMER in the last 168 hours.   Radiology    CARDIAC CATHETERIZATION  Result Date: 06/12/2021  Dist LM to Prox LAD lesion is 95% stenosed.  Prox LAD to Mid LAD lesion is 95% stenosed.  Ost Cx to Prox Cx lesion is 95% stenosed.  1st Diag lesion is 70% stenosed.  Ramus lesion is 85% stenosed.  Prox RCA lesion is 30% stenosed.  RPDA lesion is 50% stenosed.  ----Grafts----  LIMA graft was visualized by angiography and is normal in caliber. The graft exhibits no disease.  Seq SVG- OM1-OM2 graft was  visualized by angiography and is large. The graft exhibits no disease.  SVG-1st Diag graft was visualized by angiography and is large. Mid Graft lesion is 50% stenosed.  ----------------  There is moderate left ventricular systolic dysfunction. The left ventricular ejection fraction is 35-45% by visual estimate.  LV end diastolic pressure is normal.  SUMMARY  Severe left main, LAD and LCx disease-stable compared to initial cath with minimal RCA disease.  Widely patent LIMA-LAD, sequential SVG-OM1-OM2, patent SVG-D1 with mid vessel valve causing turbulent flow and possibly 40 to 50% stenosis.  Not flow-limiting.  Moderate reduced LV function with EF of roughly 35 to 40% with basal to mid anterior anterolateral hypokinesis, but very tachycardic, therefore difficult to fully assess RECOMMENDATIONS  Return to nursing unit for ongoing care.  Medical management.  Titrate beta-blocker for improved rate control (was just increased to 50 mg yesterday)  Consider recheck echo in 3 months. Glenetta Hew, MD  ECHOCARDIOGRAM LIMITED  Result Date: 06/11/2021    ECHOCARDIOGRAM LIMITED REPORT   Patient Name:   Chad Washington Date of Exam: 06/11/2021 Medical Rec #:  [id number]           Height:       72.0 in Accession #:    [id number]          Weight:       175.3 lb Date of Birth:  [birth year]           BSA:          2.014 m Patient Age:    80 years            BP:           107/73 mmHg Patient Gender: M                   HR:  110 bpm. Exam Location:  Inpatient Procedure: Limited Echo, Color Doppler and Cardiac Doppler STAT ECHO Indications:    R94.31 Abnormal EKG  History:        Patient has prior history of Echocardiogram examinations, most                 recent 06/07/2021. Prior CABG.  Sonographer:    Raquel Sarna Senior RDCS Referring Phys: [id number] [place]  1. Left ventricular ejection fraction, by estimation, is 35 to 40%. The left ventricle has moderately decreased function. The left  ventricle demonstrates regional wall motion abnormalities (see scoring diagram/findings for description). There is akinesis of the left ventricular, apical segment. There is akinesis of the left ventricular, apical septal wall, lateral wall, anterior wall and inferolateral wall. There is hypokinesis of the left ventricular, basal-mid anterior wall.  2. Right ventricular systolic function is normal. The right ventricular size is normal.  3. The mitral valve is normal in structure. No evidence of mitral valve regurgitation. No evidence of mitral stenosis.  4. The aortic valve is normal in structure. Aortic valve regurgitation is not visualized. No aortic stenosis is present.  5. The inferior vena cava is normal in size with greater than 50% respiratory variability, suggesting right atrial pressure of 3 mmHg. FINDINGS  Left Ventricle: Left ventricular ejection fraction, by estimation, is 35 to 40%. The left ventricle has moderately decreased function. The left ventricle demonstrates regional wall motion abnormalities. The left ventricular internal cavity size was normal in size. There is no left ventricular hypertrophy. Right Ventricle: The right ventricular size is normal. No increase in right ventricular wall thickness. Right ventricular systolic function is normal. Left Atrium: Left atrial size was normal in size. Right Atrium: Right atrial size was normal in size. Pericardium: There is no evidence of pericardial effusion. Mitral Valve: The mitral valve is normal in structure. No evidence of mitral valve stenosis. Tricuspid Valve: The tricuspid valve is normal in structure. Tricuspid valve regurgitation is trivial. No evidence of tricuspid stenosis. Aortic Valve: The aortic valve is normal in structure. Aortic valve regurgitation is not visualized. No aortic stenosis is present. Pulmonic Valve: The pulmonic valve was normal in structure. Pulmonic valve regurgitation is not visualized. No evidence of pulmonic  stenosis. Aorta: The aortic root is normal in size and structure. Venous: The inferior vena cava is normal in size with greater than 50% respiratory variability, suggesting right atrial pressure of 3 mmHg. IAS/Shunts: No atrial level shunt detected by color flow Doppler. RIGHT VENTRICLE TAPSE (M-mode): 0.9 cm Fransico Him MD Electronically signed by Fransico Him MD Signature Date/Time: 06/11/2021/2:46:55 PM    Final     Cardiac Studies   2D echocardiogram (06/11/2021)  IMPRESSIONS     1. Left ventricular ejection fraction, by estimation, is 35 to 40%. The  left ventricle has moderately decreased function. The left ventricle  demonstrates regional wall motion abnormalities (see scoring  diagram/findings for description). There is  akinesis of the left ventricular, apical segment. There is akinesis of the  left ventricular, apical septal wall, lateral wall, anterior wall and  inferolateral wall. There is hypokinesis of the left ventricular,  basal-mid anterior wall.   2. Right ventricular systolic function is normal. The right ventricular  size is normal.   3. The mitral valve is normal in structure. No evidence of mitral valve  regurgitation. No evidence of mitral stenosis.   4. The aortic valve is normal in structure. Aortic valve regurgitation is  not visualized. No  aortic stenosis is present.   5. The inferior vena cava is normal in size with greater than 50%  respiratory variability, suggesting right atrial pressure of 3 mmHg.     Cardiac catheterization (06/06/2021)  Conclusion    Ramus lesion is 85% stenosed. Dist LM to Ost LAD lesion is 95% stenosed. Ost Cx lesion is 95% stenosed. Prox LAD lesion is 95% stenosed. 1st Diag lesion is 70% stenosed. Prox RCA lesion is 30% stenosed. RPDA lesion is 50% stenosed.   Severe 95% distal left main stenosis percent proximal LAD stenosis, 70% diffuse first diagonal stenosis, 95% ostial left circumflex stenosis.  The RCA has mild luminal  irregularity with narrowings of 20 and 30%.  There is a 50% ostial narrowing and a small PDA vessel.   Acute LV dysfunction with EF estimate approximately 50% with mild distal anterior lateral/apical hypocontractility.  LVEDP 23 mmHg.   Insertion of intra-aortic balloon pump for hemodynamic support prior to emergent CaBG surgery this evening.   RECOMMENDATION: The patient will be taken to the operating room by Dr. Modesto Charon emergently for critical left main stenosis with subtotal LAD and ostial circumflex stenoses.  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 Claiborne Billings on 06/06/2021 revealing left main and two-vessel disease.  He underwent placement of an intra-aortic balloon pump and urgent CABG by Dr. Roxan Hockey with a LIMA to the LAD, vein to the diagonal and sequential vein to 2 marginal branches.  He has progressed as expected however recently he was noted to have persistent sinus tachycardia with EKG changes in the anterior precordial leads and 2D echo that showed decline in EF with worsening wall motion abnormalities in the LAD distribution along with elevated troponins.  Assessment & Plan    1: Coronary artery disease-postop day 6 CABG x4 in the setting of anterior STEMI.  In intra-aortic balloon pump was placed for coronary perfusion.  He had a LIMA to his LAD, vein to diagonal branch and sequential vein to 2 marginal branches by Dr. Roxan Hockey.  He has been progressing nicely.  He was having sinus tachycardia for unclear reasons on EKG that showed anterior ST segment elevation.  His troponins were elevated at 5000 and echo showed a decline in LV function down to 35% with worsening anterior wall motion abnormalities, however, re-look cath showed patent grafts without technical issues  2: Acute systolic heart failure-ischemic cardiomyopathy  with worsening LV function from the immediate postop TEE where his EF was in the 45 to 50% range down to 35% by transthoracic echo yesterday with worsening anterior wall motion abnormality.  Cath yesterday showed patent grafts. Will titrate medical therapy -agree with increase in BB - may be able to increase further tomorrow to 100 mg BID. He net negative close to 1L yesterday. LVEDP 11 mmHg - given that his LVEF is moderately reduced, recommend starting lasix 40 mg po daily. Check BNP in am tomorrow.  3: Hyperlipidemia-LDL 122 on admission, on high-dose statin therapy.     For questions or updates, please contact [place] Please consult [email or web address] for contact info under   Pixie Casino, MD, FACC, [place] Director of the [facility]  [facility] Diplomate of the American Board of Clinical Lipidology Attending Cardiologist  Direct Dial: [phone]  Fax: [phone]  Website:  [email or web address]  Pixie Casino, MD  06/13/2021, 10:32 AM

## 2021-06-13 NOTE — Progress Notes (Signed)
Epicardial pacing wires removed, per order. Wire ends intact upon removal. Sites clean and dry. Bedrest x1 hour. Vitals being monitored per protocol.

## 2021-06-13 NOTE — Evaluation (Signed)
Physical Therapy Evaluation Patient Details Name: ABOUBACAR MATSUO MRN: [id number] DOB: 1941/12/22 Today's Date: 06/13/2021   History of Present Illness  Pt is a 80 y/o male admitted 06/06/21 with chest pain conerning for STEMI. S/P CABG x 4 on 06/07/21. Cardiac cath 6/17.  PMH includes: asthma, GERD.  Clinical Impression  PT presents to PT with deficits in functional mobility, gait, balance, power, endurance. Pt requires physical assistance to perform transfers due to LE weakness at this time. Pt also requires cues to recall sternal precautions, but does implement well when mobilizing at this time. Pt will benefit from continued acute PT POC to improve LE strength and aide in a return to independence. PT recommends discharge to SNF portion of [facility] as the patient has no reliable caregiver assistance available at home and continues to require physical assistance with mobilize.    Follow Up Recommendations SNF    Equipment Recommendations  Rolling walker with 5" wheels    Recommendations for Other Services       Precautions / Restrictions Precautions Precautions: Sternal Precaution Booklet Issued: Yes (comment) Precaution Comments: reviewed with pt Restrictions Weight Bearing Restrictions: Yes Other Position/Activity Restrictions: sternal precautions      Mobility  Bed Mobility               General bed mobility comments: not assessed, pt received and left in recliner    Transfers Overall transfer level: Needs assistance Equipment used: Rolling walker (2 wheeled) Transfers: Sit to/from Stand Sit to Stand: Min assist         General transfer comment: cues for anterior lean, rocking for momentum, require physical assistance to power into standing  Ambulation/Gait Ambulation/Gait assistance: Supervision Gait Distance (Feet): 250 Feet (one brief standing rest break mid-walk) Assistive device: Rolling walker (2 wheeled) Gait Pattern/deviations: Step-through  pattern Gait velocity: reduced Gait velocity interpretation: 1.31 - 2.62 ft/sec, indicative of limited community ambulator General Gait Details: pt with slowed step-through gait, cues to maintain BOS within [employer]            Wheelchair Mobility    Modified Rankin (Stroke Patients Only)       Balance Overall balance assessment: Needs assistance Sitting-balance support: No upper extremity supported;Feet supported Sitting balance-Leahy Scale: Good     Standing balance support: Single extremity supported;Bilateral upper extremity supported Standing balance-Leahy Scale: Poor Standing balance comment: relies on BUE support                             Pertinent Vitals/Pain Pain Assessment: No/denies pain    Home Living Family/patient expects to be discharged to:: Skilled nursing facility                 Additional Comments: from [facility], pt reports he will discharge to SNF portion of [facility] for temporary rehab and increased assistance    Prior Function Level of Independence: Independent         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     Hand Dominance   Dominant Hand: Left    Extremity/Trunk Assessment   Upper Extremity Assessment Upper Extremity Assessment: Generalized weakness    Lower Extremity Assessment Lower Extremity Assessment: Generalized weakness    Cervical / Trunk Assessment Cervical / Trunk Assessment: Normal  Communication   Communication: No difficulties  Cognition Arousal/Alertness: Awake/alert Behavior During Therapy: WFL for tasks assessed/performed Overall Cognitive Status: Impaired/Different from baseline Area of Impairment: Memory  Memory: Decreased recall of precautions         General Comments: pt requires minimal cues for recall of sternal precautions      General Comments General comments (skin integrity, edema, etc.): VSS on RA     Exercises     Assessment/Plan    PT Assessment Patient needs continued PT services  PT Problem List Decreased strength;Decreased activity tolerance;Decreased balance;Decreased mobility;Decreased knowledge of precautions;Cardiopulmonary status limiting activity       PT Treatment Interventions DME instruction;Gait training;Functional mobility training;Therapeutic activities;Therapeutic exercise;Balance training;Patient/family education    PT Goals (Current goals can be found in the Care Plan section)  Acute Rehab PT Goals Patient Stated Goal: to return to independence PT Goal Formulation: With patient Time For Goal Achievement: 06/27/21 Potential to Achieve Goals: Good    Frequency Min 2X/week (see more often if possible)   Barriers to discharge Decreased caregiver support      Co-evaluation               AM-PAC PT "6 Clicks" Mobility  Outcome Measure Help needed turning from your back to your side while in a flat bed without using bedrails?: A Little Help needed moving from lying on your back to sitting on the side of a flat bed without using bedrails?: A Little Help needed moving to and from a bed to a chair (including a wheelchair)?: A Little Help needed standing up from a chair using your arms (e.g., wheelchair or bedside chair)?: A Little Help needed to walk in hospital room?: A Little Help needed climbing 3-5 steps with a railing? : A Lot 6 Click Score: 17    End of Session   Activity Tolerance: Patient tolerated treatment well Patient left: in chair;with call bell/phone within reach;with chair alarm set Nurse Communication: Mobility status PT Visit Diagnosis: Other abnormalities of gait and mobility (R26.89);Muscle weakness (generalized) (M62.81)    Time: 6294-7654 PT Time Calculation (min) (ACUTE ONLY): 22 min   Charges:   PT Evaluation $PT Eval Low Complexity: [place], PT, DPT Acute Rehabilitation Pager: [phone]   Zenaida Niece 06/13/2021, 10:29 AM

## 2021-06-14 DIAGNOSIS — I5043 Acute on chronic combined systolic (congestive) and diastolic (congestive) heart failure: Secondary | ICD-10-CM | POA: Diagnosis not present

## 2021-06-14 DIAGNOSIS — I255 Ischemic cardiomyopathy: Secondary | ICD-10-CM | POA: Diagnosis not present

## 2021-06-14 DIAGNOSIS — Z951 Presence of aortocoronary bypass graft: Secondary | ICD-10-CM | POA: Diagnosis not present

## 2021-06-14 DIAGNOSIS — I2109 ST elevation (STEMI) myocardial infarction involving other coronary artery of anterior wall: Secondary | ICD-10-CM | POA: Diagnosis not present

## 2021-06-14 LAB — BASIC METABOLIC PANEL
Anion gap: 9 (ref 5–15)
BUN: 17 mg/dL (ref 8–23)
CO2: 27 mmol/L (ref 22–32)
Calcium: 8.7 mg/dL — ABNORMAL LOW (ref 8.9–10.3)
Chloride: 102 mmol/L (ref 98–111)
Creatinine, Ser: 0.91 mg/dL (ref 0.61–1.24)
GFR, Estimated: >60 mL/min (ref >60)
Glucose, Bld: 113 mg/dL — ABNORMAL HIGH (ref 70–99)
Potassium: 3.9 mmol/L (ref 3.5–5.1)
Sodium: 138 mmol/L (ref 135–145)

## 2021-06-14 LAB — BRAIN NATRIURETIC PEPTIDE: B Natriuretic Peptide: 764.2 pg/mL — ABNORMAL HIGH (ref 0.0–100.0)

## 2021-06-14 MED ORDER — FUROSEMIDE 20 MG PO TABS
20.0000 mg | ORAL_TABLET | Freq: Every day | ORAL | Status: DC
  Administered 2021-06-15: 20 mg via ORAL
  Filled 2021-06-14: qty 1

## 2021-06-14 MED ORDER — GUAIFENESIN ER 600 MG PO TB12
600.0000 mg | ORAL_TABLET | Freq: Two times a day (BID) | ORAL | Status: DC
  Administered 2021-06-14 – 2021-06-15 (×3): 600 mg via ORAL
  Filled 2021-06-14 (×3): qty 1

## 2021-06-14 MED ORDER — METOPROLOL TARTRATE 100 MG PO TABS
100.0000 mg | ORAL_TABLET | Freq: Two times a day (BID) | ORAL | Status: DC
  Administered 2021-06-14 – 2021-06-15 (×2): 100 mg via ORAL
  Filled 2021-06-14 (×2): qty 1

## 2021-06-14 NOTE — Progress Notes (Signed)
Mobility Specialist - Progress Note   06/14/21 1411  Mobility  Activity Ambulated in hall  Level of Assistance Minimal assist, patient does 75% or more  Assistive Device Front wheel walker  Distance Ambulated (ft) 440 ft  Mobility Ambulated with assistance in hallway  Mobility Response Tolerated well  Mobility performed by Mobility specialist  $Mobility charge 1 Mobility   Pt min assist to stand from edge of bed, otherwise standby assist. He was asx throughout. Pt left sitting on edge of bed, call bell at side. VSS throughout.   Pricilla Handler Mobility Specialist Mobility Specialist Phone: [phone]

## 2021-06-14 NOTE — TOC Progression Note (Signed)
Transition of Care [facility]) - Progression Note    Patient Details  Name: Chad Washington MRN: [id number] Date of Birth: 1941/12/27  Transition of Care [facility]) CM/SW [place], [place] Phone Number: [phone] 06/14/2021, 10:13 AM  Clinical Narrative:     CSW started pt's authorization through the [facility] portal. Auth # [id number].  TOC team will continue to assist with discharge planning needs.   Expected Discharge Plan: [place] Barriers to Discharge: Continued Medical Work up  Expected Discharge Plan and Services Expected Discharge Plan: [place] Choice: [place] arrangements for the past 2 months: [place]                                       Social Determinants of Health (SDOH) Interventions    Readmission Risk Interventions No flowsheet data found.

## 2021-06-14 NOTE — Progress Notes (Signed)
Progress Note  Patient Name: Chad Washington Date of Encounter: 06/14/2021  [facility] Cardiologist: [name], MD   Subjective   Net negative 320 cc overnight - BNP 764. Creatinine minimally up with diuresis - monitor. Weight is down 2kg since yesterday.  Inpatient Medications    Scheduled Meds:  aspirin EC  325 mg Oral Daily   Or   aspirin  324 mg Per Tube Daily   atorvastatin  40 mg Oral Daily   bisacodyl  10 mg Oral Daily   Or   bisacodyl  10 mg Rectal Daily   budesonide  0.25 mg Nebulization BID   docusate sodium  200 mg Oral Daily   furosemide  40 mg Oral Daily   ketotifen  1 drop Both Eyes BID   losartan  25 mg Oral Daily   mouth rinse  15 mL Mouth Rinse BID   metoprolol tartrate  75 mg Oral BID   montelukast  10 mg Oral q1800   pantoprazole  40 mg Oral Daily   sodium chloride flush  3 mL Intravenous Q12H   Continuous Infusions:  sodium chloride     PRN Meds: sodium chloride, alum & mag hydroxide-simeth, magnesium hydroxide, melatonin, metoprolol tartrate, ondansetron (ZOFRAN) IV, oxyCODONE, sodium chloride flush, traMADol   Vital Signs    Vitals:   06/14/21 0021 06/14/21 0415 06/14/21 0810 06/14/21 0840  BP: 118/84 130/86 101/63   Pulse: 94 (!) 114 (!) 105   Resp: 20 20 18    Temp: 98.8 F (37.1 C) 98.1 F (36.7 C) 98.2 F (36.8 C)   TempSrc: Oral Oral Oral   SpO2: 96% 98% 96% 96%  Weight:  75.9 kg    Height:        Intake/Output Summary (Last 24 hours) at 06/14/2021 1033 Last data filed at 06/13/2021 2044 Gross per 24 hour  Intake 0 ml  Output 200 ml  Net -200 ml   Last 3 Weights 06/14/2021 06/13/2021 06/12/2021  Weight (lbs) 167 lb 6.4 oz 170 lb 6.4 oz 172 lb 14.4 oz  Weight (kg) 75.932 kg 77.293 kg 78.427 kg      Telemetry    Sinus tachycardia around 100 - Personally Reviewed  ECG    N/A  Physical Exam   GEN: No acute distress.   Neck: No JVD Cardiac: RRR, no murmurs, rubs, or gallops.  Respiratory: Clear to  auscultation bilaterally. GI: Soft, nontender, non-distended  MS: No edema; No deformity. Neuro:  Nonfocal  Psych: Normal affect   Labs    High Sensitivity Troponin:   Recent Labs  Lab 06/06/21 2252 06/06/21 2355 06/11/21 1135 06/11/21 1317  TROPONINIHS 233* 785* 5,931* 5,414*      Chemistry Recent Labs  Lab 06/10/21 0054 06/12/21 0054 06/14/21 0137  NA 137 140 138  K 3.9 4.6 3.9  CL 104 107 102  CO2 24 27 27   GLUCOSE 109* 104* 113*  BUN 15 15 17   CREATININE 0.70 0.83 0.91  CALCIUM 8.5* 8.2* 8.7*  GFRNONAA >60 >60 >60  ANIONGAP 9 6 9      Hematology Recent Labs  Lab 06/08/21 1537 06/09/21 0507 06/10/21 0054  WBC 13.5* 12.7* 14.2*  RBC 3.54* 3.46* 3.85*  HGB 11.6* 11.2* 12.5*  HCT 34.8* 33.6* 36.0*  MCV 98.3 97.1 93.5  MCH 32.8 32.4 32.5  MCHC 33.3 33.3 34.7  RDW 15.0 14.7 14.3  PLT 99* 101* 129*    BNP Recent Labs  Lab 06/14/21 0137  BNP 764.2*  DDimer No results for input(s): DDIMER in the last 168 hours.   Radiology    CARDIAC CATHETERIZATION  Result Date: 06/12/2021  Dist LM to Prox LAD lesion is 95% stenosed.  Prox LAD to Mid LAD lesion is 95% stenosed.  Ost Cx to Prox Cx lesion is 95% stenosed.  1st Diag lesion is 70% stenosed.  Ramus lesion is 85% stenosed.  Prox RCA lesion is 30% stenosed.  RPDA lesion is 50% stenosed.  ----Grafts----  LIMA graft was visualized by angiography and is normal in caliber. The graft exhibits no disease.  Seq SVG- OM1-OM2 graft was visualized by angiography and is large. The graft exhibits no disease.  SVG-1st Diag graft was visualized by angiography and is large. Mid Graft lesion is 50% stenosed.  ----------------  There is moderate left ventricular systolic dysfunction. The left ventricular ejection fraction is 35-45% by visual estimate.  LV end diastolic pressure is normal.  SUMMARY  Severe left main, LAD and LCx disease-stable compared to initial cath with minimal RCA disease.  Widely patent  LIMA-LAD, sequential SVG-OM1-OM2, patent SVG-D1 with mid vessel valve causing turbulent flow and possibly 40 to 50% stenosis.  Not flow-limiting.  Moderate reduced LV function with EF of roughly 35 to 40% with basal to mid anterior anterolateral hypokinesis, but very tachycardic, therefore difficult to fully assess RECOMMENDATIONS  Return to nursing unit for ongoing care.  Medical management.  Titrate beta-blocker for improved rate control (was just increased to 50 mg yesterday)  Consider recheck echo in 3 months. Glenetta Hew, MD   Cardiac Studies   2D echocardiogram (06/11/2021)  IMPRESSIONS     1. Left ventricular ejection fraction, by estimation, is 35 to 40%. The  left ventricle has moderately decreased function. The left ventricle  demonstrates regional wall motion abnormalities (see scoring  diagram/findings for description). There is  akinesis of the left ventricular, apical segment. There is akinesis of the  left ventricular, apical septal wall, lateral wall, anterior wall and  inferolateral wall. There is hypokinesis of the left ventricular,  basal-mid anterior wall.   2. Right ventricular systolic function is normal. The right ventricular  size is normal.   3. The mitral valve is normal in structure. No evidence of mitral valve  regurgitation. No evidence of mitral stenosis.   4. The aortic valve is normal in structure. Aortic valve regurgitation is  not visualized. No aortic stenosis is present.   5. The inferior vena cava is normal in size with greater than 50%  respiratory variability, suggesting right atrial pressure of 3 mmHg.     Cardiac catheterization (06/06/2021)  Conclusion    Ramus lesion is 85% stenosed. Dist LM to Ost LAD lesion is 95% stenosed. Ost Cx lesion is 95% stenosed. Prox LAD lesion is 95% stenosed. 1st Diag lesion is 70% stenosed. Prox RCA lesion is 30% stenosed. RPDA lesion is 50% stenosed.   Severe 95% distal left main stenosis percent  proximal LAD stenosis, 70% diffuse first diagonal stenosis, 95% ostial left circumflex stenosis.  The RCA has mild luminal irregularity with narrowings of 20 and 30%.  There is a 50% ostial narrowing and a small PDA vessel.   Acute LV dysfunction with EF estimate approximately 50% with mild distal anterior lateral/apical hypocontractility.  LVEDP 23 mmHg.   Insertion of intra-aortic balloon pump for hemodynamic support prior to emergent CaBG surgery this evening.   RECOMMENDATION: The patient will be taken to the operating room by Dr. Modesto Charon emergently for critical left main  stenosis with subtotal LAD and ostial circumflex stenoses.  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 Claiborne Billings on 06/06/2021 revealing left main and two-vessel disease.  He underwent placement of an intra-aortic balloon pump and urgent CABG by Dr. Roxan Hockey with a LIMA to the LAD, vein to the diagonal and sequential vein to 2 marginal branches.  He has progressed as expected however recently he was noted to have persistent sinus tachycardia with EKG changes in the anterior precordial leads and 2D echo that showed decline in EF with worsening wall motion abnormalities in the LAD distribution along with elevated troponins.  Assessment & Plan    1: Coronary artery disease-postop day 6 CABG x4 in the setting of anterior STEMI.  In intra-aortic balloon pump was placed for coronary perfusion.  He had a LIMA to his LAD, vein to diagonal branch and sequential vein to 2 marginal branches by Dr. Roxan Hockey.  He has been progressing nicely.  He was having sinus tachycardia for unclear reasons on EKG that showed anterior ST segment elevation.  His troponins were elevated at 5000 and echo showed a decline in LV function down to 35% with worsening anterior wall motion abnormalities,  however, re-look cath showed patent grafts without technical issues -Increase metoprolol to 100 mg BID  2: Acute systolic heart failure-ischemic cardiomyopathy with worsening LV function from the immediate postop TEE where his EF was in the 45 to 50% range down to 35% by transthoracic echo yesterday with worsening anterior wall motion abnormality.  Cath yesterday showed patent grafts. Will titrate medical therapy -agree with increase in BB - may be able to increase further tomorrow to 100 mg BID. He net negative close to 1L yesterday. LVEDP 11 mmHg - given that his LVEF is moderately reduced, recommend starting lasix 40 mg po daily.  -BNP elevated - good output, but he does not look very volume overloaded, creatinine rising - will decrease lasix to 20 mg daily  3: Hyperlipidemia-LDL 122 on admission, on high-dose statin therapy.    Probably ok for d/c to [facility] tomorrow.  For questions or updates, please contact [place] Please consult [email or web address] for contact info under   Pixie Casino, MD, FACC, [place] Director of the [facility]  Cardiovascular Risk Reduction Clinic Diplomate of the American Board of Clinical Lipidology Attending Cardiologist  Direct Dial: [phone]  Fax: [phone]  Website:  [email or web address]  Pixie Casino, MD  06/14/2021, 10:33 AM

## 2021-06-14 NOTE — Progress Notes (Addendum)
2 Days Post-Op Procedure(s) (LRB): LEFT HEART CATH AND CORS/GRAFTS ANGIOGRAPHY (N/A) Subjective: Feels okay this morning, no complaints  Objective: Vital signs in last 24 hours: Temp:  [97.9 F (36.6 C)-98.8 F (37.1 C)] 98.1 F (36.7 C) (06/19 0415) Pulse Rate:  [93-122] 114 (06/19 0415) Cardiac Rhythm: Sinus tachycardia (06/19 0750) Resp:  [18-20] 20 (06/19 0415) BP: (117-142)/(78-93) 130/86 (06/19 0415) SpO2:  [95 %-98 %] 98 % (06/19 0415) Weight:  [75.9 kg] 75.9 kg (06/19 0415)     Intake/Output from previous day: 06/18 0701 - 06/19 0700 In: 0  Out: 320 [Urine:320] Intake/Output this shift: No intake/output data recorded.  General appearance: alert, cooperative, and no distress Heart: regular rate and rhythm, S1, S2 normal, no murmur, click, rub or gallop Lungs: clear to auscultation bilaterally Abdomen: soft, non-tender; bowel sounds normal; no masses,  no organomegaly Extremities: extremities normal, atraumatic, no cyanosis or edema Wound: clean and dry  Lab Results: No results for input(s): WBC, HGB, HCT, PLT in the last 72 hours. BMET:  Recent Labs    06/12/21 0054 06/14/21 0137  NA 140 138  K 4.6 3.9  CL 107 102  CO2 27 27  GLUCOSE 104* 113*  BUN 15 17  CREATININE 0.83 0.91  CALCIUM 8.2* 8.7*    PT/INR: No results for input(s): LABPROT, INR in the last 72 hours. ABG    Component Value Date/Time   PHART 7.292 (L) 06/07/2021 1640   HCO3 22.6 06/07/2021 1640   TCO2 24 06/07/2021 1640   ACIDBASEDEF 4.0 (H) 06/07/2021 1640   O2SAT 71.3 06/07/2021 2006   CBG (last 3)  No results for input(s): GLUCAP in the last 72 hours.  Assessment/Plan: S/P Procedure(s) (LRB): LEFT HEART CATH AND CORS/GRAFTS ANGIOGRAPHY (N/A)  CV- tachycardia, rate 110s, cardiology titrating BB. Also on cozaar, Cardiac cath yesterday shows patent grafts, reduced EF 35-45% Pulm- tolerating room air with good oxygen saturation Renal-creatinine 0.91, electrolytes okay, weight is  down to baseline H and H 12.5/36.0, stable Endo-not a diabetic, sugars stopped   Plan: EPW removed. He is ready for discharge back to his home at [facility].    LOS: 7 days    Elgie Collard 06/14/2021  I have seen and examined the patient and agree with the assessment and plan as outlined.  Rexene Alberts, MD 06/14/2021 12:35 PM

## 2021-06-15 ENCOUNTER — Encounter (HOSPITAL_COMMUNITY): Payer: Self-pay | Admitting: Cardiology

## 2021-06-15 DIAGNOSIS — K5900 Constipation, unspecified: Secondary | ICD-10-CM | POA: Diagnosis not present

## 2021-06-15 DIAGNOSIS — M791 Myalgia, unspecified site: Secondary | ICD-10-CM | POA: Diagnosis not present

## 2021-06-15 DIAGNOSIS — M62838 Other muscle spasm: Secondary | ICD-10-CM | POA: Diagnosis not present

## 2021-06-15 DIAGNOSIS — I25708 Atherosclerosis of coronary artery bypass graft(s), unspecified, with other forms of angina pectoris: Secondary | ICD-10-CM | POA: Diagnosis not present

## 2021-06-15 DIAGNOSIS — E785 Hyperlipidemia, unspecified: Secondary | ICD-10-CM | POA: Diagnosis not present

## 2021-06-15 DIAGNOSIS — Z79899 Other long term (current) drug therapy: Secondary | ICD-10-CM | POA: Diagnosis not present

## 2021-06-15 DIAGNOSIS — M545 Low back pain, unspecified: Secondary | ICD-10-CM | POA: Diagnosis not present

## 2021-06-15 DIAGNOSIS — I5043 Acute on chronic combined systolic (congestive) and diastolic (congestive) heart failure: Secondary | ICD-10-CM | POA: Diagnosis not present

## 2021-06-15 DIAGNOSIS — M47816 Spondylosis without myelopathy or radiculopathy, lumbar region: Secondary | ICD-10-CM | POA: Diagnosis not present

## 2021-06-15 DIAGNOSIS — I2510 Atherosclerotic heart disease of native coronary artery without angina pectoris: Secondary | ICD-10-CM | POA: Diagnosis not present

## 2021-06-15 DIAGNOSIS — R531 Weakness: Secondary | ICD-10-CM | POA: Diagnosis not present

## 2021-06-15 DIAGNOSIS — J45998 Other asthma: Secondary | ICD-10-CM | POA: Diagnosis not present

## 2021-06-15 DIAGNOSIS — I252 Old myocardial infarction: Secondary | ICD-10-CM | POA: Diagnosis not present

## 2021-06-15 DIAGNOSIS — Z951 Presence of aortocoronary bypass graft: Secondary | ICD-10-CM | POA: Diagnosis not present

## 2021-06-15 DIAGNOSIS — Z743 Need for continuous supervision: Secondary | ICD-10-CM | POA: Diagnosis not present

## 2021-06-15 DIAGNOSIS — H01119 Allergic dermatitis of unspecified eye, unspecified eyelid: Secondary | ICD-10-CM | POA: Diagnosis not present

## 2021-06-15 DIAGNOSIS — I2109 ST elevation (STEMI) myocardial infarction involving other coronary artery of anterior wall: Secondary | ICD-10-CM | POA: Diagnosis not present

## 2021-06-15 DIAGNOSIS — R5381 Other malaise: Secondary | ICD-10-CM | POA: Diagnosis not present

## 2021-06-15 DIAGNOSIS — E569 Vitamin deficiency, unspecified: Secondary | ICD-10-CM | POA: Diagnosis not present

## 2021-06-15 DIAGNOSIS — I255 Ischemic cardiomyopathy: Secondary | ICD-10-CM | POA: Diagnosis not present

## 2021-06-15 DIAGNOSIS — Z9889 Other specified postprocedural states: Secondary | ICD-10-CM | POA: Diagnosis not present

## 2021-06-15 DIAGNOSIS — K219 Gastro-esophageal reflux disease without esophagitis: Secondary | ICD-10-CM | POA: Diagnosis not present

## 2021-06-15 LAB — BASIC METABOLIC PANEL
Anion gap: 9 (ref 5–15)
BUN: 18 mg/dL (ref 8–23)
CO2: 26 mmol/L (ref 22–32)
Calcium: 8.8 mg/dL — ABNORMAL LOW (ref 8.9–10.3)
Chloride: 101 mmol/L (ref 98–111)
Creatinine, Ser: 0.99 mg/dL (ref 0.61–1.24)
GFR, Estimated: >60 mL/min (ref >60)
Glucose, Bld: 106 mg/dL — ABNORMAL HIGH (ref 70–99)
Potassium: 3.7 mmol/L (ref 3.5–5.1)
Sodium: 136 mmol/L (ref 135–145)

## 2021-06-15 LAB — SARS CORONAVIRUS 2 (TAT 6-24 HRS): SARS Coronavirus 2: NEGATIVE

## 2021-06-15 MED ORDER — LOSARTAN POTASSIUM 25 MG PO TABS: 25.0000 mg | ORAL_TABLET | Freq: Every day | ORAL | 3 refills | Status: DC

## 2021-06-15 MED ORDER — POTASSIUM CHLORIDE CRYS ER 10 MEQ PO TBCR
10.0000 meq | EXTENDED_RELEASE_TABLET | Freq: Every day | ORAL | Status: DC
  Administered 2021-06-15: 10 meq via ORAL
  Filled 2021-06-15: qty 1

## 2021-06-15 MED ORDER — ASPIRIN 325 MG PO TBEC: 325.0000 mg | DELAYED_RELEASE_TABLET | Freq: Every day | ORAL | 0 refills | Status: DC

## 2021-06-15 MED ORDER — POTASSIUM CHLORIDE CRYS ER 20 MEQ PO TBCR: 20.0000 meq | EXTENDED_RELEASE_TABLET | Freq: Every day | ORAL | Status: DC

## 2021-06-15 MED ORDER — POTASSIUM CHLORIDE CRYS ER 10 MEQ PO TBCR: 10.0000 meq | EXTENDED_RELEASE_TABLET | Freq: Every day | ORAL | 1 refills | Status: DC

## 2021-06-15 MED ORDER — METOPROLOL TARTRATE 100 MG PO TABS: 100.0000 mg | ORAL_TABLET | Freq: Two times a day (BID) | ORAL | 3 refills | Status: DC

## 2021-06-15 MED ORDER — FUROSEMIDE 20 MG PO TABS: 20.0000 mg | ORAL_TABLET | Freq: Every day | ORAL | 1 refills | Status: DC

## 2021-06-15 MED ORDER — GUAIFENESIN ER 600 MG PO TB12: 600.0000 mg | ORAL_TABLET | Freq: Two times a day (BID) | ORAL | Status: DC | PRN

## 2021-06-15 MED ORDER — ATORVASTATIN CALCIUM 40 MG PO TABS: 40.0000 mg | ORAL_TABLET | Freq: Every day | ORAL | 3 refills | Status: AC

## 2021-06-15 NOTE — Discharge Summary (Signed)
Physician Discharge Summary  Patient ID: Chad Washington MRN: [id number] DOB/AGE: 1941-06-23 80 y.o.  Admit date: 06/06/2021 Discharge date: 06/15/2021  Admission Diagnoses:  Discharge Diagnoses:  Principal Problem:   Acute ST elevation myocardial infarction (STEMI) of anterior wall (HCC) Active Problems:   Status post surgery   S/P CABG x 4   Left main coronary artery disease   Abnormal echocardiogram   Ischemic cardiomyopathy   Acute on chronic combined systolic and diastolic CHF (congestive heart failure) ([place])   Discharged Condition: stable Hospital Course:   The patient was admitted urgently through the emergency department to the catheterization lab and then to the operating room.  He underwent CABG x4 with a left internal mammary artery to LAD, saphenous vein graft to diagonal and sequential saphenous vein graft to obtuse marginals #1 and 2.  He tolerated procedure well was taken to the surgical intensive care unit in stable condition.  Postoperative hospital course:  Patient has progressed well.  Has maintained stable hemodynamics.  Swan-Ganz and arterial lines were removed on postoperative day #1.  He has been started on aspirin, beta-blocker and statin.  He does have a expected postoperative volume overload and is responding well to diuretics.  Renal function has remained normal.  He does have an expected acute blood loss anemia which is mild.  Hemoglobin hematocrit on 06/08/2021 are 11.1 and 33.5 respectively.  Chest tubes were removed on postoperative day 1.  His blood sugars have been under good control using standard protocols.  His diet has been advanced.  He is being mobilized in a routine manner using cardiac surgery rehab modalities.  The patient was tachycardic and hypertensive.  His lopressor dose was titrated accordingly.  He had some impulsive behavior which resolved and he was alert and oriented.  He was maintaining NSR and was transferred to the progressive care  unit on 06/09/2021.  The patient remained tachycardic and hypertensive.  His lopressor dose was further increased and he was initiated on low dose Cozaar.  CXR showed a small left pleural effusion.  He is being diuresed for mild volume over load state.  He remained tachycardic despite titration of Lopressor.  EKG was obtained and showed ST segment elevation in the anterior leads.  Cardiology consult was obtained and obtained an Echocardiogram which showed a reduction in his EF and worsening motion of his anterior wall.  There was concern there could be early failure of his bypass grafts and cardiac catheterization was recommended.  This was performed on 6/17 and showed full patency of his coronary bypass grafts.  They recommended further titration of his BB to 100 mg BID.  He remains in Sinus Tachycardia.  His pacing wires have been removed without difficulty.  He was started on a gentle regimen of Lasix, potassium.  His surgical incisions are healing without evidence of infection.  He is medically stable for discharge to [facility] skilled nursing facility today.  He will likely return to the assisted living level of care within the next few weeks.  Consults: None  Significant Diagnostic Studies:  CLINICAL DATA:  Atelectasis.  Recent coronary artery bypass grafting   EXAM: CHEST - 2 VIEW   COMPARISON:  June 09, 2021   FINDINGS: Cordis has been removed. Temporary pacemaker wires remain attached to the right heart. Persistent trace apical pneumothorax. There is subcutaneous air overlying the left upper chest. There are small pleural effusions on each side with atelectatic change in the left lower lobe. There is also slight atelectasis  in the right upper lobe. Heart size and pulmonary vascular normal. Status post coronary artery bypass grafting. No adenopathy no bone lesions   IMPRESSION: Cordis removed. Temporary pacemaker wires attached to right heart. Subcutaneous air on the left with trace  apical pneumothorax. No pneumothorax on the right.   Small pleural effusions bilaterally with left lower lobe atelectatic change. Stable cardiac silhouette.     Electronically Signed   By: Lowella Grip III M.D.   On: 06/10/2021 08:11   Treatments:  NAME: Chad Washington MEDICAL RECORD NO: [id number] ACCOUNT NO: [id number] DATE OF BIRTH: [birth year] FACILITY: [facility] LOCATION: [facility] PHYSICIAN: Revonda Standard. Roxan Hockey, MD   Operative Report   DATE OF PROCEDURE: 06/07/2021     PREOPERATIVE DIAGNOSIS:  Left main and severe 2-vessel coronary artery disease with ST elevation myocardial infarction.   POSTOPERATIVE DIAGNOSIS:  Left main and severe 2-vessel coronary artery disease with ST elevation myocardial infarction.   PROCEDURES PERFORMED:  Median sternotomy, extracorporeal circulation, coronary artery bypass grafting x4, left internal mammary artery to LAD, saphenous vein graft to diagonal and sequential saphenous vein graft to obtuse marginals 1 and 2.   SURGEON:  Revonda Standard. Roxan Hockey, MD.   ASSISTANTJadene Pierini.   ANESTHESIA:  General.   FINDINGS:  Good quality conduits, good quality targets.  Transesophageal echocardiography showed some mild hypokinesis of the anterior and septal regions.   CLINICAL NOTE:  The patient is a 80 year old man who presented with chest pain.  He was noted to be having an ST elevation MI.  He was taken emergently to the catheterization laboratory, was found to have critical distal left main disease with proximal circumflex and LAD disease.  An intraaortic balloon pump was placed for coronary perfusion.  The patient was advised to undergo emergent coronary artery bypass grafting.  The indications, risks, benefits, and alternatives were discussed with the patient.   He accepted the risks and agreed to proceed. Discharge Exam: Blood pressure 104/70, pulse 90, temperature 97.6 F (36.4 C), temperature source Oral, resp. rate 18, height 6' (1.829  m), weight 77.8 kg, SpO2 99 %.   General appearance: alert, cooperative, and no distress Heart: regular rate and rhythm Lungs: clear to auscultation bilaterally Abdomen: soft, non-tender; bowel sounds normal; no masses,  no organomegaly Extremities: edema trace Wound: clean and dry   Disposition:   Discharge Instructions     Amb Referral to Cardiac Rehabilitation   Complete by: As directed    Diagnosis:  CABG STEMI     CABG X ___: 4   After initial evaluation and assessments completed: Virtual Based Care may be provided alone or in conjunction with Phase 2 Cardiac Rehab based on patient barriers.: Yes      Allergies as of 06/15/2021       Reactions   Latex Rash        Medication List     STOP taking these medications    Ibuprofen-diphenhydrAMINE HCl 200-25 MG Caps   saw palmetto 500 MG capsule       TAKE these medications    albuterol 108 (90 Base) MCG/ACT inhaler Commonly known as: VENTOLIN HFA Inhale 1-2 puffs into the lungs every 6 (six) hours as needed.   aspirin 325 MG EC tablet Take 1 tablet (325 mg total) by mouth daily.   atorvastatin 40 MG tablet Commonly known as: LIPITOR Take 1 tablet (40 mg total) by mouth daily.   azelastine 0.05 % ophthalmic solution Commonly known as: OPTIVAR Place 1 drop into  both eyes 2 (two) times daily.   CENTRUM SILVER PO Take 1 tablet by mouth daily.   cetirizine 10 MG tablet Commonly known as: ZYRTEC Take 10 mg by mouth daily at 6 PM.   dexlansoprazole 60 MG capsule Commonly known as: DEXILANT Take 60 mg by mouth daily.   EPINEPHrine 0.3 mg/0.3 mL Soaj injection Commonly known as: EPI-PEN Inject 0.3 mg into the muscle as needed (allergic reaction).   fluticasone 110 MCG/ACT inhaler Commonly known as: FLOVENT HFA Inhale 1 puff into the lungs 2 (two) times daily.   furosemide 20 MG tablet Commonly known as: LASIX Take 1 tablet (20 mg total) by mouth daily.   guaiFENesin 600 MG 12 hr  tablet Commonly known as: MUCINEX Take 1 tablet (600 mg total) by mouth 2 (two) times daily as needed.   losartan 25 MG tablet Commonly known as: COZAAR Take 1 tablet (25 mg total) by mouth daily.   metoprolol tartrate 100 MG tablet Commonly known as: LOPRESSOR Take 1 tablet (100 mg total) by mouth 2 (two) times daily.   montelukast 10 MG tablet Commonly known as: SINGULAIR Take 10 mg by mouth daily at 6 PM.   potassium chloride 10 MEQ tablet Commonly known as: KLOR-CON Take 1 tablet (10 mEq total) by mouth daily.               Durable Medical Equipment  (From admission, onward)           Start     Ordered   06/15/21 0807  For home use only DME 4 wheeled rolling walker with seat  Once       Question:  Patient needs a walker to treat with the following condition  Answer:  Weakness   06/15/21 3382            Follow-up Information     Deberah Pelton, NP Follow up on 07/02/2021.   Specialty: Cardiology Why: Appointment is at 3:15 Contact information: [place] [address] [phone]         [name], MD Follow up on 07/07/2021.   Specialty: Cardiothoracic Surgery Why: Appointment is at 10:45, please get CXR at 10:15 at [place] located on first floor of our office building Contact information: [place] [address] [phone]                 Signed: Antony Odea, PA-C 06/15/2021, 3:04 PM

## 2021-06-15 NOTE — TOC Transition Note (Signed)
Transition of Care [facility]) - CM/SW Discharge Note   Patient Details  Name: Chad Washington MRN: [id number] Date of Birth: 27-Mar-1941  Transition of Care [facility]) CM/SW Contact:  Coralee Pesa, [place] Phone Number: 06/15/2021, 3:10 PM   Clinical Narrative:    Pt to be transported to [facility] via [place]. Nurse to call report to [phone].   Final next level of care: [place] Barriers to Discharge: Barriers Resolved   Patient Goals and CMS Choice Patient states their goals for this hospitalization and ongoing recovery are:: [facility] SNF for rehab   Choice offered to / list presented to : Patient  Discharge Placement              Patient chooses bed at: [facility] Patient to be transferred to facility by: [facility] Name of family member notified: Lanny Hurst, patient Patient and family notified of of transfer: 06/15/21  Discharge Plan and Services     Post Acute Care Choice: [place]                               Social Determinants of Health (SDOH) Interventions     Readmission Risk Interventions No flowsheet data found.

## 2021-06-15 NOTE — Progress Notes (Addendum)
      [address]       ,[place] [id number]             [phone]      3 Days Post-Op Procedure(s) (LRB): LEFT HEART CATH AND CORS/GRAFTS ANGIOGRAPHY (N/A)  Subjective:  No new complaints.  He is ready to go to [facility].  + ambulation  Objective: Vital signs in last 24 hours: Temp:  [98 F (36.7 C)-98.2 F (36.8 C)] 98.2 F (36.8 C) (06/20 0420) Pulse Rate:  [80-117] 92 (06/20 0420) Cardiac Rhythm: Sinus tachycardia (06/19 1900) Resp:  [15-20] 20 (06/20 0420) BP: (101-123)/(61-78) 123/78 (06/20 0420) SpO2:  [92 %-97 %] 97 % (06/20 0420) FiO2 (%):  [21 %] 21 % (06/19 0840) Weight:  [77.8 kg] 77.8 kg (06/20 0420)  General appearance: alert, cooperative, and no distress Heart: regular rate and rhythm Lungs: clear to auscultation bilaterally Abdomen: soft, non-tender; bowel sounds normal; no masses,  no organomegaly Extremities: edema trace Wound: clean and dry  Lab Results: No results for input(s): WBC, HGB, HCT, PLT in the last 72 hours. BMET:  Recent Labs    06/14/21 0137 06/15/21 0232  NA 138 136  K 3.9 3.7  CL 102 101  CO2 27 26  GLUCOSE 113* 106*  BUN 17 18  CREATININE 0.91 0.99  CALCIUM 8.7* 8.8*    PT/INR: No results for input(s): LABPROT, INR in the last 72 hours. ABG    Component Value Date/Time   PHART 7.292 (L) 06/07/2021 1640   HCO3 22.6 06/07/2021 1640   TCO2 24 06/07/2021 1640   ACIDBASEDEF 4.0 (H) 06/07/2021 1640   O2SAT 71.3 06/07/2021 2006   CBG (last 3)  No results for input(s): GLUCAP in the last 72 hours.  Assessment/Plan: S/P Procedure(s) (LRB): LEFT HEART CATH AND CORS/GRAFTS ANGIOGRAPHY (N/A)  CV- Sinus Tachycardia- continue Lopressor, Cozaar Pulm- no acute issues, continue IS Renal- creatinine WNL, minimal edema on exam, continue low dose lasix Deconditioning- continue PT/OT, will order rolling, walker, to [employer]- patient stable, stable Sinus tachycardia, continue diuretics, will d/c home  today   LOS: 8 days    Ellwood Handler, PA-C 06/15/2021 Patient seen and examined, agree with above  Remo Lipps C. Roxan Hockey, MD [facility] [phone]

## 2021-06-15 NOTE — Progress Notes (Signed)
CARDIAC REHAB PHASE I   D/c education completed with pt and son. Pt educated on importance of site care and monitoring incisions daily. Encouraged continued IS use, walks, and sternal precautions. Pt given in-the-tube sheet along with heart healthy diet. Reviewed restrictions and exercise guidelines. Will refer to CRP II [place] Rufina Falco, RN BSN 06/15/2021 9:28 AM

## 2021-06-15 NOTE — Progress Notes (Signed)
Pt. Has been slightly tachycardic for days with the team working on metoprolol dosage adjustments. Pt. Asymptomatic

## 2021-06-15 NOTE — Progress Notes (Signed)
Progress Note  Patient Name: Chad Washington Date of Encounter: 06/15/2021  Primary Cardiologist: Claiborne Billings (new)  Subjective   Feels well; no SOB  Inpatient Medications    Scheduled Meds:  aspirin EC  325 mg Oral Daily   Or   aspirin  324 mg Per Tube Daily   atorvastatin  40 mg Oral Daily   bisacodyl  10 mg Oral Daily   Or   bisacodyl  10 mg Rectal Daily   budesonide  0.25 mg Nebulization BID   docusate sodium  200 mg Oral Daily   furosemide  20 mg Oral Daily   guaiFENesin  600 mg Oral BID   ketotifen  1 drop Both Eyes BID   losartan  25 mg Oral Daily   mouth rinse  15 mL Mouth Rinse BID   metoprolol tartrate  100 mg Oral BID   montelukast  10 mg Oral q1800   pantoprazole  40 mg Oral Daily   potassium chloride  10 mEq Oral Daily   sodium chloride flush  3 mL Intravenous Q12H   Continuous Infusions:  sodium chloride     PRN Meds: sodium chloride, alum & mag hydroxide-simeth, magnesium hydroxide, melatonin, metoprolol tartrate, ondansetron (ZOFRAN) IV, oxyCODONE, sodium chloride flush, traMADol   Vital Signs    Vitals:   06/14/21 2339 06/15/21 0420 06/15/21 0726 06/15/21 0822  BP: 108/67 123/78 (!) 122/91   Pulse: 80 92 (!) 110   Resp: 15 20 20    Temp: 98.2 F (36.8 C) 98.2 F (36.8 C) 98.3 F (36.8 C)   TempSrc: Oral Oral Oral   SpO2: 96% 97% 94% 94%  Weight:  77.8 kg    Height:        Intake/Output Summary (Last 24 hours) at 06/15/2021 0836 Last data filed at 06/14/2021 1543 Gross per 24 hour  Intake 0 ml  Output --  Net 0 ml    I/O since admission:   Filed Weights   06/13/21 0400 06/14/21 0415 06/15/21 0420  Weight: 77.3 kg 75.9 kg 77.8 kg    Telemetry    Sinus tachycardia at 113 - Personally Reviewed  ECG    ECG (independently read by me):  Physical Exam    BP (!) 122/91 (BP Location: Left Arm)   Pulse (!) 110   Temp 98.3 F (36.8 C) (Oral)   Resp 20   Ht 6' (1.829 m)   Wt 77.8 kg   SpO2 94%   BMI 23.27 kg/m  General:  Alert, oriented, no distress.  Skin: normal turgor, no rashes, warm and dry HEENT: Normocephalic, atraumatic. Pupils equal round and reactive to light; sclera anicteric; extraocular muscles intact;  Nose without nasal septal hypertrophy Mouth/Parynx benign; Mallinpatti scale 3 Neck: No JVD, no carotid bruits; normal carotid upstroke Lungs: clear to ausculatation and percussion; no wheezing or rales Chest wall: sternotomy site stable without tenderness to palpitation Heart: PMI not displaced, RRR, s1 s2 normal, 1/6 systolic murmur, no diastolic murmur, no rubs, gallops, thrills, or heaves Abdomen: soft, nontender; no hepatosplenomehaly, BS+; abdominal aorta nontender and not dilated by palpation. Back: no CVA tenderness Pulses 2+ Musculoskeletal: full range of motion, normal strength, no joint deformities Extremities: Trace ankle edema; no clubbing cyanosis, Homan's sign negative  Neurologic: grossly nonfocal; Cranial nerves grossly wnl Psychologic: Normal mood and affect   Labs    Chemistry Recent Labs  Lab 06/12/21 0054 06/14/21 0137 06/15/21 0232  NA 140 138 136  K 4.6 3.9 3.7  CL 107  102 101  CO2 27 27 26   GLUCOSE 104* 113* 106*  BUN 15 17 18   CREATININE 0.83 0.91 0.99  CALCIUM 8.2* 8.7* 8.8*  GFRNONAA >60 >60 >60  ANIONGAP 6 9 9      Hematology Recent Labs  Lab 06/08/21 1537 06/09/21 0507 06/10/21 0054  WBC 13.5* 12.7* 14.2*  RBC 3.54* 3.46* 3.85*  HGB 11.6* 11.2* 12.5*  HCT 34.8* 33.6* 36.0*  MCV 98.3 97.1 93.5  MCH 32.8 32.4 32.5  MCHC 33.3 33.3 34.7  RDW 15.0 14.7 14.3  PLT 99* 101* 129*    Cardiac EnzymesNo results for input(s): TROPONINI in the last 168 hours. No results for input(s): TROPIPOC in the last 168 hours.   BNP Recent Labs  Lab 06/14/21 0137  BNP 764.2*     DDimer No results for input(s): DDIMER in the last 168 hours.   Lipid Panel     Component Value Date/Time   CHOL 168 06/06/2021 2355   TRIG 46 06/06/2021 2355   HDL 37 (L)  06/06/2021 2355   CHOLHDL 4.5 06/06/2021 2355   VLDL 9 06/06/2021 2355   LDLCALC 122 (H) 06/06/2021 2355     Radiology    No results found.  Cardiac Studies   Cardiac catheterization (06/06/2021)   Conclusion     Ramus lesion is 85% stenosed. Dist LM to Ost LAD lesion is 95% stenosed. Ost Cx lesion is 95% stenosed. Prox LAD lesion is 95% stenosed. 1st Diag lesion is 70% stenosed. Prox RCA lesion is 30% stenosed. RPDA lesion is 50% stenosed.   Severe 95% distal left main stenosis percent proximal LAD stenosis, 70% diffuse first diagonal stenosis, 95% ostial left circumflex stenosis.  The RCA has mild luminal irregularity with narrowings of 20 and 30%.  There is a 50% ostial narrowing and a small PDA vessel.   Acute LV dysfunction with EF estimate approximately 50% with mild distal anterior lateral/apical hypocontractility.  LVEDP 23 mmHg.   Insertion of intra-aortic balloon pump for hemodynamic support prior to emergent CaBG surgery this evening.   RECOMMENDATION: The patient will be taken to the operating room by Dr. Modesto Charon emergently for critical left main stenosis with subtotal LAD and ostial circumflex stenoses.           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 Claiborne Billings on 06/06/2021 revealing left main and two-vessel disease.  He underwent placement of an intra-aortic balloon pump and urgent CABG by Dr. Roxan Hockey with a LIMA to the LAD, vein to the diagonal and sequential vein to 2 marginal branches.  He has progressed as expected however recently he was noted to have persistent sinus tachycardia with EKG changes in the anterior precordial leads and 2D echo that showed decline in EF with worsening wall motion abnormalities in the LAD distribution along with elevated troponins.  Assessment & Plan   CAD/  DAY 7 S/P EMERGENT CABG: No recurrent chest  pain.  Initial ECG on presentation had shown anterior STEMI.  With critical left main disease noted at cath he underwent successful intra-aortic balloon pump insertion he underwent emergent CABG revascularization by Dr. Roxan Hockey.  Relook angiography on 6/17 is widely patent grafts. Ischemic Cardiomyopathy: Subsequent echo showed reduction in EF to 35% and in the setting of his significant tachycardia with ST changes anteriorly repeat catheterization was performed which verified patent grafts.  He continues tachycardic.  Metoprolol was increased yesterday to metoprolol tartrate 100 mg twice a day.  He is not yet steady-state on this dose.  Ultimately we will plan to switch to metoprolol succinate as outpatient.  He is now on losartan at low-dose and depending upon blood pressure this will need to be further titrated.  If LV function remains depressed, he may be a candidate to transition to [facility] as an outpatient.  BNP yesterday remained slightly elevated at 792.  He is now on daily Lasix 20 mg.  Hyperlipidemia: 122 on admission.  He is now on atorvastatin at 40 mg.  Target LDL is in the 60s or below.  May need to titrate to 80 mg as outpatient if to reach target. Plan is for discharge home to [facility] skilled nursing facility.  Arrange follow-up in office with me.    Signed, [name], MD, [facility] 06/15/2021, 8:36 AM

## 2021-06-16 DIAGNOSIS — I25708 Atherosclerosis of coronary artery bypass graft(s), unspecified, with other forms of angina pectoris: Secondary | ICD-10-CM | POA: Diagnosis not present

## 2021-06-16 DIAGNOSIS — Z951 Presence of aortocoronary bypass graft: Secondary | ICD-10-CM | POA: Diagnosis not present

## 2021-06-16 DIAGNOSIS — E785 Hyperlipidemia, unspecified: Secondary | ICD-10-CM | POA: Diagnosis not present

## 2021-06-16 DIAGNOSIS — I255 Ischemic cardiomyopathy: Secondary | ICD-10-CM | POA: Diagnosis not present

## 2021-06-18 DIAGNOSIS — M62838 Other muscle spasm: Secondary | ICD-10-CM | POA: Diagnosis not present

## 2021-06-18 DIAGNOSIS — M545 Low back pain, unspecified: Secondary | ICD-10-CM | POA: Diagnosis not present

## 2021-06-18 DIAGNOSIS — I2510 Atherosclerotic heart disease of native coronary artery without angina pectoris: Secondary | ICD-10-CM | POA: Diagnosis not present

## 2021-06-18 DIAGNOSIS — I252 Old myocardial infarction: Secondary | ICD-10-CM | POA: Diagnosis not present

## 2021-06-22 DIAGNOSIS — M545 Low back pain, unspecified: Secondary | ICD-10-CM | POA: Diagnosis not present

## 2021-06-22 DIAGNOSIS — I2510 Atherosclerotic heart disease of native coronary artery without angina pectoris: Secondary | ICD-10-CM | POA: Diagnosis not present

## 2021-06-22 DIAGNOSIS — Z79899 Other long term (current) drug therapy: Secondary | ICD-10-CM | POA: Diagnosis not present

## 2021-06-22 DIAGNOSIS — K5900 Constipation, unspecified: Secondary | ICD-10-CM | POA: Diagnosis not present

## 2021-06-22 DIAGNOSIS — M791 Myalgia, unspecified site: Secondary | ICD-10-CM | POA: Diagnosis not present

## 2021-06-25 ENCOUNTER — Telehealth (HOSPITAL_COMMUNITY): Payer: Self-pay

## 2021-06-25 NOTE — Telephone Encounter (Signed)
Called patient to see if he is interested in the Cardiac Rehab Program. Patient expressed interest. Explained scheduling process and went over insurance, patient verbalized understanding. Will contact patient for scheduling once f/u has been completed. [place] adv pt of our backlog 1-3 months.

## 2021-06-25 NOTE — Telephone Encounter (Signed)
Pt insurance is active and benefits verified through [facility]. Co-pay $20.00, DED $0.00/$0.00 met, out of pocket $195.00/$195.00 met, co-insurance 0%. No pre-authorization required. Passport, 06/25/21 @ 11:53AM, [id number]   Will contact patient to see if he is interested in the Cardiac Rehab Program. If interested, patient will need to complete follow up appt. Once completed, patient will be contacted for scheduling upon review by the RN Navigator.

## 2021-06-26 DIAGNOSIS — M545 Low back pain, unspecified: Secondary | ICD-10-CM | POA: Diagnosis not present

## 2021-06-26 DIAGNOSIS — M791 Myalgia, unspecified site: Secondary | ICD-10-CM | POA: Diagnosis not present

## 2021-06-26 DIAGNOSIS — R000 Tachycardia, unspecified: Secondary | ICD-10-CM | POA: Diagnosis not present

## 2021-06-26 DIAGNOSIS — I2510 Atherosclerotic heart disease of native coronary artery without angina pectoris: Secondary | ICD-10-CM | POA: Diagnosis not present

## 2021-06-30 NOTE — Progress Notes (Signed)
Cardiology Clinic Note   Patient Name: Chad Washington Date of Encounter: 07/02/2021  Primary Care Provider:  [name], MD Primary Cardiologist:  [name], MD  Patient Profile     Chad Washington 80 year old male presents the clinic today for follow-up evaluation of his coronary artery disease status post CABG x4 on 06/07/2021.  Roxan Hockey)  Past Medical History    Past Medical History:  Diagnosis Date   Asthma    allergy induced asthma   GERD (gastroesophageal reflux disease)    Past Surgical History:  Procedure Laterality Date   COLONOSCOPY WITH PROPOFOL N/A 06/21/2017   Procedure: COLONOSCOPY WITH PROPOFOL;  Surgeon: [name], MD;  Location: [facility];  Service: Endoscopy;  Laterality: N/A;   CORONARY ARTERY BYPASS GRAFT N/A 06/06/2021   Procedure: CORONARY ARTERY BYPASS GRAFTING (CABG) TIMES FOUR, ON PUMP, USING LEFT INTERNAL MAMMARY ARTERY AND ENDOSCOPICALLY HARVESTED RIGHT GREATER SAPHENOUS VEIN;  Surgeon: [name], MD;  Location: [place];  Service: Open Heart Surgery;  Laterality: N/A;   ENDOVEIN HARVEST OF GREATER SAPHENOUS VEIN Right 06/06/2021   Procedure: ENDOVEIN HARVEST OF GREATER SAPHENOUS VEIN;  Surgeon: [name], MD;  Location: [place];  Service: Open Heart Surgery;  Laterality: Right;   EYE SURGERY Bilateral 6 yrs ago   ioc lens implants   HERNIA REPAIR     inguinal hernia left 8- 10 yrs ago, right inguinal hernia age 38   IABP INSERTION N/A 06/06/2021   Procedure: IABP Insertion;  Surgeon: [name], MD;  Location: [place] CV [facility];  Service: Cardiovascular;  Laterality: N/A;   LEFT HEART CATH AND CORONARY ANGIOGRAPHY N/A 06/06/2021   Procedure: LEFT HEART CATH AND CORONARY ANGIOGRAPHY;  Surgeon: [name], MD;  Location: [place] CV [facility];  Service: Cardiovascular;  Laterality: N/A;   LEFT HEART CATH AND CORS/GRAFTS ANGIOGRAPHY N/A 06/12/2021   Procedure: LEFT HEART CATH AND CORS/GRAFTS ANGIOGRAPHY;   Surgeon: [name], MD;  Location: [place] [facility];  Service: Cardiovascular;  Laterality: N/A;   TEE WITHOUT CARDIOVERSION  06/06/2021   Procedure: TRANSESOPHAGEAL ECHOCARDIOGRAM (TEE);  Surgeon: [name], MD;  Location[facility];  Service: Open Heart Surgery;;    Allergies  Allergies  Allergen Reactions   Latex Rash    History of Present Illness     Chad Washington has a PMH of acute STEMI, hyperlipidemia, ischemic cardiomyopathy, acute on chronic combined systolic and diastolic CHF, coronary artery disease status post CABG x4 on 06/07/2021.  He was admitted through the emergency department and urgently taken to the cardiac Cath Lab and then to the operating room.  We received LIMA to LAD, SVG to diagonal and obtuse marginals 1 and 2.  He tolerated the surgery well and was transferred in stable condition to surgical intensive care.  He remained hemodynamically stable.  His renal function remained normal.  His chest tubes were removed on postop day 1.  He was mobilized in a routine manner.  He was noted to be tachycardic and hypertensive.  His Lopressor was increased.  He maintained normal sinus rhythm and was transferred to progressive care on 06/09/2021.  He remained tachycardic and hypertensive.  His metoprolol was further increased.  He was also initiated on low-dose losartan at that time.  His CXR showed a small left pleural effusion.  His EKG showed ST segment elevation in anterior leads.  Cardiology was consulted.  An echocardiogram was performed which showed a reduction in his  EF and worsening anterior wall motion.  There was concern for early failure of his bypass grafts and cardiac catheterization was recommended.  Cardiac catheterization was performed on 06/12/2020 showed patency of his coronary artery bypass grafts.  Further titration of his beta-blocker was recommended.  His pacing wires were removed without difficulty.  His surgical incisions were healing well  without evidence of/signs of infection.  He was discharged in stable condition to [facility] on 06/15/2021.  He presents the clinic today for follow-up evaluation states he feels well.  He has been slowly increasing his physical activity and is doing physical therapy exercises, balance exercises, and has been maintaining his sternal precautions doing chair exercises.  We reviewed his EKG today which shows a heart rate of 130 bpm.  He reports compliance with his metoprolol.  On recheck his heart rate is 120 after being seated for a few minutes.  He continues to have slight right ankle edema.  I have asked him to continue his lower extremity support stockings, continue heart healthy diet, increase metoprolol to 150 mg twice daily, follow-up with Dr. Roxan Hockey as scheduled and follow-up with cardiology in 1 month.  Today he denies chest pain, shortness of breath, lower extremity edema, fatigue, palpitations, melena, hematuria, hemoptysis, diaphoresis, weakness, presyncope, syncope, orthopnea, and PND.  Home Medications    Prior to Admission medications   Medication Sig Start Date End Date Taking? Authorizing Provider  albuterol (VENTOLIN HFA) 108 (90 Base) MCG/ACT inhaler Inhale 1-2 puffs into the lungs every 6 (six) hours as needed. 03/25/21   [provider]  aspirin EC 325 MG EC tablet Take 1 tablet (325 mg total) by mouth daily. 06/15/21   Barrett, Erin R, PA-C  atorvastatin (LIPITOR) 40 MG tablet Take 1 tablet (40 mg total) by mouth daily. 06/15/21   Barrett, Erin R, PA-C  azelastine (OPTIVAR) 0.05 % ophthalmic solution Place 1 drop into both eyes 2 (two) times daily.    [provider]  cetirizine (ZYRTEC) 10 MG tablet Take 10 mg by mouth daily at 6 PM.    [provider]  dexlansoprazole (DEXILANT) 60 MG capsule Take 60 mg by mouth daily.    [provider]  EPINEPHrine 0.3 mg/0.3 mL IJ SOAJ injection Inject 0.3 mg into the muscle as needed (allergic  reaction).    [provider]  fluticasone (FLOVENT HFA) 110 MCG/ACT inhaler Inhale 1 puff into the lungs 2 (two) times daily.    [provider]  furosemide (LASIX) 20 MG tablet Take 1 tablet (20 mg total) by mouth daily. 06/15/21   Barrett, Erin R, PA-C  guaiFENesin (MUCINEX) 600 MG 12 hr tablet Take 1 tablet (600 mg total) by mouth 2 (two) times daily as needed. 06/15/21   Barrett, Erin R, PA-C  losartan (COZAAR) 25 MG tablet Take 1 tablet (25 mg total) by mouth daily. 06/15/21   Barrett, Erin R, PA-C  metoprolol tartrate (LOPRESSOR) 100 MG tablet Take 1 tablet (100 mg total) by mouth 2 (two) times daily. 06/15/21   Barrett, Erin R, PA-C  montelukast (SINGULAIR) 10 MG tablet Take 10 mg by mouth daily at 6 PM.    [provider]  Multiple Vitamins-Minerals (CENTRUM SILVER PO) Take 1 tablet by mouth daily.    [provider]  potassium chloride (KLOR-CON) 10 MEQ tablet Take 1 tablet (10 mEq total) by mouth daily. 06/15/21   Barrett, Lodema Hong, PA-C    Family History    Family History  Problem Relation  Age of Onset   Hypertension Mother    Cancer Father    Diabetes Father    He indicated that his mother is deceased. He indicated that his father is deceased.  Social History    Social History   Socioeconomic History   Marital status: Married    Spouse name: Not on file   Number of children: Not on file   Years of education: Not on file   Highest education level: Not on file  Occupational History   Not on file  Tobacco Use   Smoking status: Never   Smokeless tobacco: Never  Vaping Use   Vaping Use: Never used  Substance and Sexual Activity   Alcohol use: Yes    Alcohol/week: 0.0 standard drinks    Comment: occasionally   Drug use: No   Sexual activity: Yes    Partners: Male  Other Topics Concern   Not on file  Social History Narrative   Not on file   Social Determinants of Health   Financial Resource Strain: Not on file  Food Insecurity:  Not on file  Transportation Needs: Not on file  Physical Activity: Not on file  Stress: Not on file  Social Connections: Not on file  Intimate Partner Violence: Not on file     Review of Systems    General:  No chills, fever, night sweats or weight changes.  Cardiovascular:  No chest pain, dyspnea on exertion, edema, orthopnea, palpitations, paroxysmal nocturnal dyspnea. Dermatological: No rash, lesions/masses Respiratory: No cough, dyspnea Urologic: No hematuria, dysuria Abdominal:   No nausea, vomiting, diarrhea, bright red blood per rectum, melena, or hematemesis Neurologic:  No visual changes, wkns, changes in mental status. All other systems reviewed and are otherwise negative except as noted above.  Physical Exam    VS:  BP (!) 110/56   Pulse (!) 120   Ht 6' (1.829 m)   Wt 163 lb 3.2 oz (74 kg)   SpO2 98%   BMI 22.13 kg/m  , BMI Body mass index is 22.13 kg/m. GEN: Well nourished, well developed, in no acute distress. HEENT: normal. Neck: Supple, no JVD, carotid bruits, or masses. Cardiac: RRR, no murmurs, rubs, or gallops. No clubbing, cyanosis, edema.  Radials/DP/PT 2+ and equal bilaterally.  Respiratory:  Respirations regular and unlabored, clear to auscultation bilaterally. GI: Soft, nontender, nondistended, BS + x 4. MS: no deformity or atrophy. Skin: warm and dry, no rash. Neuro:  Strength and sensation are intact. Psych: Normal affect.  Accessory Clinical Findings    Recent Labs: 06/06/2021: ALT 29 06/08/2021: Magnesium 2.6 06/10/2021: Hemoglobin 12.5; Platelets 129 06/14/2021: B Natriuretic Peptide 764.2 06/15/2021: BUN 18; Creatinine, Ser 0.99; Potassium 3.7; Sodium 136   Recent Lipid Panel    Component Value Date/Time   CHOL 168 06/06/2021 2355   TRIG 46 06/06/2021 2355   HDL 37 (L) 06/06/2021 2355   CHOLHDL 4.5 06/06/2021 2355   VLDL 9 06/06/2021 2355   LDLCALC 122 (H) 06/06/2021 2355    ECG personally reviewed by me today-sinus tachycardia with  occasional premature ventricular complexes rightward axis deviation septal infarct undetermined age T wave abnormality consider anterior lateral ischemia.  Echocardiogram 06/11/2021 IMPRESSIONS     1. Left ventricular ejection fraction, by estimation, is 35 to 40%. The  left ventricle has moderately decreased function. The left ventricle  demonstrates regional wall motion abnormalities (see scoring  diagram/findings for description). There is  akinesis of the left ventricular, apical segment. There is akinesis of the  left ventricular, apical septal wall, lateral wall, anterior wall and  inferolateral wall. There is hypokinesis of the left ventricular,  basal-mid anterior wall.   2. Right ventricular systolic function is normal. The right ventricular  size is normal.   3. The mitral valve is normal in structure. No evidence of mitral valve  regurgitation. No evidence of mitral stenosis.   4. The aortic valve is normal in structure. Aortic valve regurgitation is  not visualized. No aortic stenosis is present.   5. The inferior vena cava is normal in size with greater than 50%  respiratory variability, suggesting right atrial pressure of 3 mmHg.  Cardiac catheterization 06/07/2019 Ramus lesion is 85% stenosed. Dist LM to Ost LAD lesion is 95% stenosed. Ost Cx lesion is 95% stenosed. Prox LAD lesion is 95% stenosed. 1st Diag lesion is 70% stenosed. Prox RCA lesion is 30% stenosed. RPDA lesion is 50% stenosed.   Severe 95% distal left main stenosis percent proximal LAD stenosis, 70% diffuse first diagonal stenosis, 95% ostial left circumflex stenosis.  The RCA has mild luminal irregularity with narrowings of 20 and 30%.  There is a 50% ostial narrowing and a small PDA vessel.   Acute LV dysfunction with EF estimate approximately 50% with mild distal anterior lateral/apical hypocontractility.  LVEDP 23 mmHg.   Insertion of intra-aortic balloon pump for hemodynamic support prior to  emergent CaBG surgery this evening.   RECOMMENDATION: The patient will be taken to the operating room by Dr. Modesto Charon emergently for critical left main stenosis with subtotal LAD and ostial circumflex stenoses.  Diagnostic Dominance: Right    Intervention   Cardiac catheterization 06/12/2021 Dist LM to Prox LAD lesion is 95% stenosed. Prox LAD to Mid LAD lesion is 95% stenosed. Ost Cx to Prox Cx lesion is 95% stenosed. 1st Diag lesion is 70% stenosed. Ramus lesion is 85% stenosed. Prox RCA lesion is 30% stenosed. RPDA lesion is 50% stenosed. ----Grafts---- LIMA graft was visualized by angiography and is normal in caliber. The graft exhibits no disease. Seq SVG- OM1-OM2 graft was visualized by angiography and is large. The graft exhibits no disease. SVG-1st Diag graft was visualized by angiography and is large. Mid Graft lesion is 50% stenosed. ---------------- There is moderate left ventricular systolic dysfunction. The left ventricular ejection fraction is 35-45% by visual estimate. LV end diastolic pressure is normal.   SUMMARY Severe left main, LAD and LCx disease-stable compared to initial cath with minimal RCA disease. Widely patent LIMA-LAD, sequential SVG-OM1-OM2, patent SVG-D1 with mid vessel valve causing turbulent flow and possibly 40 to 50% stenosis.  Not flow-limiting. Moderate reduced LV function with EF of roughly 35 to 40% with basal to mid anterior anterolateral hypokinesis, but very tachycardic, therefore difficult to fully assess     RECOMMENDATIONS Return to nursing unit for ongoing care.  Medical management. Titrate beta-blocker for improved rate control (was just increased to 50 mg yesterday) Consider recheck echo in 3 months.       Glenetta Hew, MD  Diagnostic Dominance: Right    Intervention    Assessment & Plan   1.  Status post CABG x4-no chest pain today.  Underwent cardiac catheterization 06/06/2021.  Underwent CABG x4 on  06/07/2021.  Underwent cardiac catheterization on 06/12/2021 and was found to have 50% stenosis on first diagonal graft.  Remaining grafts were widely patent. Continue metoprolol, atorvastatin, losartan, furosemide, potassium.   Heart healthy low-sodium diet-salty 6 given Increase physical activity as tolerated-maintain sternal precautions  Tachycardia-EKG today shows  sinus tachycardia with occasional premature ventricular complexes.  Cardiac unaware. Increase metoprolol to 150 mg twice daily Maintain p.o. hydration Avoid triggers for palpitations caffeine, chocolate, EtOH, dehydration etc.  Ischemic cardiomyopathy-no increased DOE or activity intolerance.  Echocardiogram showed an EF of 35-40%. Continue losartan, metoprolol Heart healthy low-sodium diet-salty 6 given Recommend up titration of ARB as blood pressure allows.  Hyperlipidemia-06/06/2021: Cholesterol 168; HDL 37; LDL Cholesterol 122; Triglycerides 46; VLDL 9 Continue atorvastatin Heart healthy low-sodium high-fiber diet Increase physical activity as tolerated  Essential hypertension-BP today 110/56.  Well-controlled at home. Continue metoprolol, furosemide, losartan Heart healthy low-sodium diet-salty 6 given Increase physical activity as tolerated  Disposition: Follow-up with Dr. Claiborne Billings in 1-2 months.   Jossie Ng. Dalessandro Baldyga NP-C    07/02/2021, 3:57 PM [address] Office [phone] Fax [phone]  Notice: This dictation was prepared with Dragon dictation along with smaller phrase technology. Any transcriptional errors that result from this process are unintentional and may not be corrected upon review.  I spent 15 minutes examining this patient, reviewing medications, and using patient centered shared decision making involving her cardiac care.  Prior to her visit I spent greater than 20 minutes reviewing her past medical history,  medications, and prior cardiac  tests.

## 2021-07-01 DIAGNOSIS — R2689 Other abnormalities of gait and mobility: Secondary | ICD-10-CM | POA: Diagnosis not present

## 2021-07-01 DIAGNOSIS — I10 Essential (primary) hypertension: Secondary | ICD-10-CM | POA: Diagnosis not present

## 2021-07-01 DIAGNOSIS — I2109 ST elevation (STEMI) myocardial infarction involving other coronary artery of anterior wall: Secondary | ICD-10-CM | POA: Diagnosis not present

## 2021-07-01 DIAGNOSIS — I5043 Acute on chronic combined systolic (congestive) and diastolic (congestive) heart failure: Secondary | ICD-10-CM | POA: Diagnosis not present

## 2021-07-01 DIAGNOSIS — I2510 Atherosclerotic heart disease of native coronary artery without angina pectoris: Secondary | ICD-10-CM | POA: Diagnosis not present

## 2021-07-01 DIAGNOSIS — R600 Localized edema: Secondary | ICD-10-CM | POA: Diagnosis not present

## 2021-07-01 DIAGNOSIS — R531 Weakness: Secondary | ICD-10-CM | POA: Diagnosis not present

## 2021-07-01 DIAGNOSIS — R000 Tachycardia, unspecified: Secondary | ICD-10-CM | POA: Diagnosis not present

## 2021-07-01 DIAGNOSIS — R278 Other lack of coordination: Secondary | ICD-10-CM | POA: Diagnosis not present

## 2021-07-02 ENCOUNTER — Encounter: Payer: Self-pay | Admitting: General Practice

## 2021-07-02 ENCOUNTER — Other Ambulatory Visit: Payer: Self-pay

## 2021-07-02 ENCOUNTER — Ambulatory Visit: Payer: Medicare PPO | Admitting: General Practice

## 2021-07-02 VITALS — BP 110/56 | HR 120 | Ht 72.0 in | Wt 163.2 lb

## 2021-07-02 DIAGNOSIS — R2689 Other abnormalities of gait and mobility: Secondary | ICD-10-CM | POA: Diagnosis not present

## 2021-07-02 DIAGNOSIS — R000 Tachycardia, unspecified: Secondary | ICD-10-CM

## 2021-07-02 DIAGNOSIS — I2109 ST elevation (STEMI) myocardial infarction involving other coronary artery of anterior wall: Secondary | ICD-10-CM | POA: Diagnosis not present

## 2021-07-02 DIAGNOSIS — I255 Ischemic cardiomyopathy: Secondary | ICD-10-CM | POA: Diagnosis not present

## 2021-07-02 DIAGNOSIS — Z951 Presence of aortocoronary bypass graft: Secondary | ICD-10-CM

## 2021-07-02 DIAGNOSIS — I10 Essential (primary) hypertension: Secondary | ICD-10-CM

## 2021-07-02 DIAGNOSIS — E7800 Pure hypercholesterolemia, unspecified: Secondary | ICD-10-CM | POA: Diagnosis not present

## 2021-07-02 DIAGNOSIS — R278 Other lack of coordination: Secondary | ICD-10-CM | POA: Diagnosis not present

## 2021-07-02 DIAGNOSIS — I5043 Acute on chronic combined systolic (congestive) and diastolic (congestive) heart failure: Secondary | ICD-10-CM | POA: Diagnosis not present

## 2021-07-02 MED ORDER — METOPROLOL TARTRATE 100 MG PO TABS: 150.0000 mg | ORAL_TABLET | Freq: Two times a day (BID) | ORAL | 3 refills | Status: DC

## 2021-07-02 NOTE — Patient Instructions (Signed)
Medication Instructions:  INCREASE METOPROLOL TARTRATE 150MG  TWICE DAILY *If you need a refill on your cardiac medications before your next appointment, please call your pharmacy*  Lab Work:   Testing/Procedures:  NONE    NONE  Special Instructions INCREASE HYDRATION ONLY 64 OUNCES DAILY  CONTINUE TO WEAR COMPRESSION  Please try to avoid these triggers: Do not use any products that have nicotine or tobacco in them. These include cigarettes, e-cigarettes, and chewing tobacco. If you need help quitting, ask your doctor. Eat heart-healthy foods. Talk with your doctor about the right eating plan for you. Exercise regularly as told by your doctor. Stay hydrated Do not drink alcohol, Caffeine or chocolate. Lose weight if you are overweight. Do not use drugs, including cannabis   Follow-Up: Your next appointment:  1-2 month(s) In Person with [name], MD OR IF UNAVAILABLE [place], FNP-C At [facility], you and your health needs are our priority.  As part of our continuing mission to provide you with exceptional heart care, we have created designated Provider Care Teams.  These Care Teams include your primary Cardiologist (physician) and Advanced Practice Providers (APPs -  Physician Assistants and Nurse Practitioners) who all work together to provide you with the care you need, when you need it.  We recommend signing up for the patient portal called "MyChart".  Sign up information is provided on this After Visit Summary.  MyChart is used to connect with patients for Virtual Visits (Telemedicine).  Patients are able to view lab/test results, encounter notes, upcoming appointments, etc.  Non-urgent messages can be sent to your provider as well.   To learn more about what you can do with MyChart, go to [email or web address].

## 2021-07-03 DIAGNOSIS — I10 Essential (primary) hypertension: Secondary | ICD-10-CM | POA: Diagnosis not present

## 2021-07-03 DIAGNOSIS — Z951 Presence of aortocoronary bypass graft: Secondary | ICD-10-CM | POA: Diagnosis not present

## 2021-07-03 DIAGNOSIS — I252 Old myocardial infarction: Secondary | ICD-10-CM | POA: Diagnosis not present

## 2021-07-03 DIAGNOSIS — R000 Tachycardia, unspecified: Secondary | ICD-10-CM | POA: Diagnosis not present

## 2021-07-03 DIAGNOSIS — I9589 Other hypotension: Secondary | ICD-10-CM | POA: Diagnosis not present

## 2021-07-06 ENCOUNTER — Other Ambulatory Visit: Payer: Self-pay | Admitting: Thoracic Surgery (Cardiothoracic Vascular Surgery)

## 2021-07-06 DIAGNOSIS — Z951 Presence of aortocoronary bypass graft: Secondary | ICD-10-CM

## 2021-07-07 ENCOUNTER — Other Ambulatory Visit: Payer: Self-pay

## 2021-07-07 ENCOUNTER — Ambulatory Visit: Payer: Medicare PPO | Admitting: Thoracic Surgery (Cardiothoracic Vascular Surgery)

## 2021-07-07 ENCOUNTER — Ambulatory Visit (INDEPENDENT_AMBULATORY_CARE_PROVIDER_SITE_OTHER): Payer: Self-pay | Admitting: Physician Assistant

## 2021-07-07 ENCOUNTER — Ambulatory Visit
Admission: RE | Admit: 2021-07-07 | Discharge: 2021-07-07 | Disposition: A | Discharge status: Home / Self Care | Payer: Medicare PPO | Source: Ambulatory Visit | Attending: Thoracic Surgery (Cardiothoracic Vascular Surgery) | Admitting: Thoracic Surgery (Cardiothoracic Vascular Surgery)

## 2021-07-07 VITALS — BP 110/74 | HR 96 | Resp 20 | Ht 72.0 in | Wt 161.0 lb

## 2021-07-07 DIAGNOSIS — J90 Pleural effusion, not elsewhere classified: Secondary | ICD-10-CM | POA: Diagnosis not present

## 2021-07-07 DIAGNOSIS — Z951 Presence of aortocoronary bypass graft: Secondary | ICD-10-CM | POA: Diagnosis not present

## 2021-07-07 NOTE — Patient Instructions (Signed)
You are encouraged to enroll and participate in the outpatient cardiac rehab program beginning as soon as practical.    You may return to driving an automobile as long as you are no longer requiring oral narcotic pain relievers during the daytime.  It would be wise to start driving only short distances during the daylight and gradually increase from there as you feel comfortable.   You may continue to gradually increase your physical activity as tolerated.  Refrain from any heavy lifting (no more than 10 pounds) or strenuous use of your arms and shoulders until at least 8 weeks from the time of your surgery, and avoid activities that cause increased pain in your chest on the side of your surgical incision.  Otherwise, you may continue to increase activities without any particular limitations.  Increase the intensity and duration of physical activity gradually.

## 2021-07-07 NOTE — Progress Notes (Deleted)
  HPI: Chad Washington is a 80 year old male with no prior cardiac history who presented to the emergency room on 06/06/2021 with acute non-ST elevation myocardial infarction.  He was also found to have acute on chronic systolic and diastolic heart failure.  He underwent emergent left heart catheterization revealing multivessel coronary artery disease.  He was taken to the OR urgently where he underwent four-vessel coronary bypass grafting.  Early postoperatively, he developed some EKG changes suggestive of possible ischemia.  He was returned to the Cath Lab to evaluate the bypass grafts all of which were found to be patent and functioning appropriately.  His postoperative course was otherwise uneventful.  Since hospital discharge the patient reports ***.  He was seen in the cardiology office yesterday for scheduled follow-up and was noted to be tachycardic.  His metoprolol was increased from 100 mg twice daily to 150 mg p.o. twice daily  Current Outpatient Medications  Medication Sig Dispense Refill   albuterol (VENTOLIN HFA) 108 (90 Base) MCG/ACT inhaler Inhale 1-2 puffs into the lungs every 6 (six) hours as needed.     aspirin EC 325 MG EC tablet Take 1 tablet (325 mg total) by mouth daily. 30 tablet 0   atorvastatin (LIPITOR) 40 MG tablet Take 1 tablet (40 mg total) by mouth daily. 30 tablet 3   azelastine (OPTIVAR) 0.05 % ophthalmic solution Place 1 drop into both eyes 2 (two) times daily.     cetirizine (ZYRTEC) 10 MG tablet Take 10 mg by mouth daily at 6 PM.     dexlansoprazole (DEXILANT) 60 MG capsule Take 60 mg by mouth daily.     EPINEPHrine 0.3 mg/0.3 mL IJ SOAJ injection Inject 0.3 mg into the muscle as needed (allergic reaction).     fluticasone (FLOVENT HFA) 110 MCG/ACT inhaler Inhale 1 puff into the lungs 2 (two) times daily.     furosemide (LASIX) 20 MG tablet Take 1 tablet (20 mg total) by mouth daily. 30 tablet 1   guaiFENesin (MUCINEX) 600 MG 12 hr tablet Take 1 tablet (600 mg  total) by mouth 2 (two) times daily as needed.     losartan (COZAAR) 25 MG tablet Take 1 tablet (25 mg total) by mouth daily. 30 tablet 3   metoprolol tartrate (LOPRESSOR) 100 MG tablet Take 1.5 tablets (150 mg total) by mouth 2 (two) times daily. 120 tablet 3   montelukast (SINGULAIR) 10 MG tablet Take 10 mg by mouth daily at 6 PM.     Multiple Vitamins-Minerals (CENTRUM SILVER PO) Take 1 tablet by mouth daily.     potassium chloride (KLOR-CON) 10 MEQ tablet Take 1 tablet (10 mEq total) by mouth daily. 30 tablet 1   No current facility-administered medications for this visit.    Physical Exam: ***  Diagnostic Tests: ***  Impression: ***  Plan: ***   Antony Odea, PA-C [facility] [phone]

## 2021-07-07 NOTE — Progress Notes (Signed)
[address]       [place] [address]             [phone]       CARDIAC SURGERY POSTOPERATIVE VISIT  Patient Name: Chad Washington MRN: [id number] DOB: 1941/11/04  Subjective: Chad Washington is a 80 y.o. male here for routine post op evaluation. Patient presented with a STEMI and underwent emergent CABG x 4 by Dr. Roxan Hockey on 06/07/2021. Patient states his appetite has been "so so". Some things he used to like, he no longer does as much. He denies nausea, abdominal pain, or vomiting.   Past Medical History:  Diagnosis Date   Asthma    allergy induced asthma   GERD (gastroesophageal reflux disease)    Prior to Admission medications   Medication Sig Start Date End Date Taking? Authorizing Provider  albuterol (VENTOLIN HFA) 108 (90 Base) MCG/ACT inhaler Inhale 1-2 puffs into the lungs every 6 (six) hours as needed. 03/25/21   [provider]  aspirin EC 325 MG EC tablet Take 1 tablet (325 mg total) by mouth daily. 06/15/21   Barrett, Erin R, PA-C  atorvastatin (LIPITOR) 40 MG tablet Take 1 tablet (40 mg total) by mouth daily. 06/15/21   Barrett, Erin R, PA-C  azelastine (OPTIVAR) 0.05 % ophthalmic solution Place 1 drop into both eyes 2 (two) times daily.    [provider]  cetirizine (ZYRTEC) 10 MG tablet Take 10 mg by mouth daily at 6 PM.    [provider]  dexlansoprazole (DEXILANT) 60 MG capsule Take 60 mg by mouth daily.    [provider]  EPINEPHrine 0.3 mg/0.3 mL IJ SOAJ injection Inject 0.3 mg into the muscle as needed (allergic reaction).    [provider]  fluticasone (FLOVENT HFA) 110 MCG/ACT inhaler Inhale 1 puff into the lungs 2 (two) times daily.    [provider]  furosemide (LASIX) 20 MG tablet Take 1 tablet (20 mg total) by mouth daily. 06/15/21   Barrett, Erin R, PA-C  guaiFENesin (MUCINEX) 600 MG 12 hr tablet Take 1 tablet (600 mg total) by mouth 2 (two) times daily as needed.  06/15/21   Barrett, Erin R, PA-C  losartan (COZAAR) 25 MG tablet Take 1 tablet (25 mg total) by mouth daily. 06/15/21   Barrett, Erin R, PA-C  metoprolol tartrate (LOPRESSOR) 100 MG tablet Take 1.5 tablets (150 mg total) by mouth 2 (two) times daily. 07/02/21   Deberah Pelton, NP  montelukast (SINGULAIR) 10 MG tablet Take 10 mg by mouth daily at 6 PM.    [provider]  Multiple Vitamins-Minerals (CENTRUM SILVER PO) Take 1 tablet by mouth daily.    [provider]  potassium chloride (KLOR-CON) 10 MEQ tablet Take 1 tablet (10 mEq total) by mouth daily. 06/15/21   Barrett, Lodema Hong, PA-C   Physical Exam:  Vitals:   Vitals:   07/07/21 1309  BP: 110/74  Pulse: 96  Resp: 20  SpO2: 96%     GENERAL: Well-nourished, well-developed, in no acute distress CARDIOVASCULAR: Regular rate and rhythm. No murmur or rub RESPIRATORY: Respiratory effort is normal. Lungs clear to auscultation. ABDOMEN: Bowel sounds present. No masses or tenderness. WOUNDS: Sternal and RLE wounds are clean and dry. Remnant of suture removed mid sternal wound. Both chest tube sites with eschars but no sign of infection.  Imaging Studies:  ImpresCLINICAL DATA: Status post CABG.  EXAM: CHEST - 2 VIEW  COMPARISON: 06/10/2021  FINDINGS: Status post median sternotomy it CABG procedure. Normal heart size. Persistent small bilateral pleural effusions. No interstitial edema, airspace consolidation, atelectasis or pneumothorax.  IMPRESSION: Persistent small bilateral pleural effusions.   Electronically Signed By: Kerby Moors M.D. On: 07/07/2021 13:50sion/Plan:  Overall, Chad Washington is recovering well from emergent coronary artery bypass grafting surgery.  He was seen by Coletta Memos, NP in the cardiology office yesterday. Lopressor was increased from 100 mg bid to 150 mg bid due to tachycardia and PVCs. However, patient states his blood pressure became low. As a result, he is again on Lopressor 100  mg bid and Losartan has been stopped in the interim. I have not made any medication changes at this visit. We spoke about the following: Patient was instructed to continue with sternal precautions (I.e. no lifting more than 10 pounds) for the next 4 weeks. Patient is not taking any narcotic for pain. So he may begin driving short distances (I.e. 30 minutes or less during the day) and may gradually increase the frequency and duration as tolerates. I encouraged the patient to participate in cardiac rehab;he wishes to do so so will make referral. Of note, patient has PT coming to his home daily as well. Regarding his appetite, I encouraged Ensure or Boost and told him to try and eat more protein. He will return to see Dr. Roxan Hockey in 4-6 weeks. He already has a follow up appointment to see cardiology in October.   Lars Pinks, PA-C 07/07/2021 1:30 PM

## 2021-08-04 ENCOUNTER — Ambulatory Visit (INDEPENDENT_AMBULATORY_CARE_PROVIDER_SITE_OTHER): Payer: Self-pay | Admitting: Thoracic Surgery (Cardiothoracic Vascular Surgery)

## 2021-08-04 ENCOUNTER — Other Ambulatory Visit: Payer: Self-pay

## 2021-08-04 ENCOUNTER — Encounter: Payer: Self-pay | Admitting: Thoracic Surgery (Cardiothoracic Vascular Surgery)

## 2021-08-04 VITALS — BP 102/61 | HR 71 | Resp 20 | Ht 72.0 in | Wt 164.0 lb

## 2021-08-04 DIAGNOSIS — Z951 Presence of aortocoronary bypass graft: Secondary | ICD-10-CM

## 2021-08-04 NOTE — Progress Notes (Signed)
[address]       [place] [address]             [phone]      HPI: Mr. Bish returns for a scheduled postoperative follow-up visit  Chandlor Jerkins is a 80 year old man who presented back in June with ST elevation MI.  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  A balloon pump was placed and he was taken for emergent coronary artery bypass grafting on 06/07/2021.  The postop.  He had some persistent tachycardia.  He actually underwent a relook catheterization because of some unusual ECG changes.  That showed his grafts were patent.  He was discharged on postoperative day 8.  He saw Lars Pinks in the office on 07/07/2021.  He was making good progress at that time.  He feels well.  He has not had any recurrent angina.  He is not having any incisional pain.  Swelling in his legs resolved with Lasix.  He feels like he is gradually getting stronger.  Past Medical History:  Diagnosis Date   Asthma    allergy induced asthma   GERD (gastroesophageal reflux disease)     Current Outpatient Medications  Medication Sig Dispense Refill   albuterol (VENTOLIN HFA) 108 (90 Base) MCG/ACT inhaler Inhale 1-2 puffs into the lungs every 6 (six) hours as needed.     aspirin EC 325 MG EC tablet Take 1 tablet (325 mg total) by mouth daily. 30 tablet 0   atorvastatin (LIPITOR) 40 MG tablet Take 1 tablet (40 mg total) by mouth daily. 30 tablet 3   azelastine (OPTIVAR) 0.05 % ophthalmic solution Place 1 drop into both eyes 2 (two) times daily.     cetirizine (ZYRTEC) 10 MG tablet Take 10 mg by mouth daily at 6 PM.     dexlansoprazole (DEXILANT) 60 MG capsule Take 60 mg by mouth daily.     EPINEPHrine 0.3 mg/0.3 mL IJ SOAJ injection Inject 0.3 mg into the muscle as needed (allergic reaction).     fluticasone (FLOVENT HFA) 110 MCG/ACT inhaler Inhale 1 puff into the lungs 2 (two) times daily.     furosemide (LASIX) 20 MG tablet Take 1 tablet (20 mg total) by mouth  daily. 30 tablet 1   guaiFENesin (MUCINEX) 600 MG 12 hr tablet Take 1 tablet (600 mg total) by mouth 2 (two) times daily as needed.     metoprolol tartrate (LOPRESSOR) 100 MG tablet Take 1.5 tablets (150 mg total) by mouth 2 (two) times daily. 120 tablet 3   montelukast (SINGULAIR) 10 MG tablet Take 10 mg by mouth daily at 6 PM.     Multiple Vitamins-Minerals (CENTRUM SILVER PO) Take 1 tablet by mouth daily.     potassium chloride (KLOR-CON) 10 MEQ tablet Take 1 tablet (10 mEq total) by mouth daily. 30 tablet 1   losartan (COZAAR) 25 MG tablet Take 1 tablet (25 mg total) by mouth daily. 30 tablet 3   No current facility-administered medications for this visit.    Physical Exam BP 102/61   Pulse 71   Resp 20   Ht 6' (1.829 m)   Wt 164 lb (74.4 kg)   SpO2 97% Comment: RA  BMI 22.65 kg/m  80 year old man in no acute distress Alert and oriented x3 with no focal deficits Sternum stable, incision well-healed Cardiac regular rate and rhythm Lungs clear bilaterally Extremities incisions well-healed no edema  Impression: Couper Joyal is a 80 year old man  who presented on 06/07/2021 with a ST elevation MI and was found to have critical left main disease.  He was taken to the operating room for emergent coronary bypass grafting.  He did have a balloon pump placed in the Cath Lab.  He is now almost 2 months out from surgery.  He has been driving on a limited basis.  He has not had any issues with that.  He is almost 8 weeks out from surgery.  At this point there are no restrictions on his activities although he was advised to start slowly and build up gradually with his upper body activities  Plan: Follow-up with cardiology I will be happy to see him back anytime in the future if I can be of any further assistance with his care  [name], MD [facility] [phone]

## 2021-08-05 ENCOUNTER — Telehealth (HOSPITAL_COMMUNITY): Payer: Self-pay

## 2021-08-05 DIAGNOSIS — R2689 Other abnormalities of gait and mobility: Secondary | ICD-10-CM | POA: Diagnosis not present

## 2021-08-05 DIAGNOSIS — I2109 ST elevation (STEMI) myocardial infarction involving other coronary artery of anterior wall: Secondary | ICD-10-CM | POA: Diagnosis not present

## 2021-08-05 DIAGNOSIS — I5043 Acute on chronic combined systolic (congestive) and diastolic (congestive) heart failure: Secondary | ICD-10-CM | POA: Diagnosis not present

## 2021-08-05 NOTE — Telephone Encounter (Signed)
Pt is not interested in the cardiac rehab program. Closed referral 

## 2021-08-11 DIAGNOSIS — L905 Scar conditions and fibrosis of skin: Secondary | ICD-10-CM | POA: Diagnosis not present

## 2021-08-11 DIAGNOSIS — L570 Actinic keratosis: Secondary | ICD-10-CM | POA: Diagnosis not present

## 2021-08-11 DIAGNOSIS — Z85828 Personal history of other malignant neoplasm of skin: Secondary | ICD-10-CM | POA: Diagnosis not present

## 2021-08-11 DIAGNOSIS — L814 Other melanin hyperpigmentation: Secondary | ICD-10-CM | POA: Diagnosis not present

## 2021-08-11 DIAGNOSIS — L821 Other seborrheic keratosis: Secondary | ICD-10-CM | POA: Diagnosis not present

## 2021-09-16 DIAGNOSIS — J3081 Allergic rhinitis due to animal (cat) (dog) hair and dander: Secondary | ICD-10-CM | POA: Diagnosis not present

## 2021-09-16 DIAGNOSIS — J301 Allergic rhinitis due to pollen: Secondary | ICD-10-CM | POA: Diagnosis not present

## 2021-09-16 DIAGNOSIS — J3089 Other allergic rhinitis: Secondary | ICD-10-CM | POA: Diagnosis not present

## 2021-09-22 DIAGNOSIS — J3081 Allergic rhinitis due to animal (cat) (dog) hair and dander: Secondary | ICD-10-CM | POA: Diagnosis not present

## 2021-09-22 DIAGNOSIS — J3089 Other allergic rhinitis: Secondary | ICD-10-CM | POA: Diagnosis not present

## 2021-09-22 DIAGNOSIS — H1045 Other chronic allergic conjunctivitis: Secondary | ICD-10-CM | POA: Diagnosis not present

## 2021-09-22 DIAGNOSIS — J301 Allergic rhinitis due to pollen: Secondary | ICD-10-CM | POA: Diagnosis not present

## 2021-10-04 NOTE — Progress Notes (Signed)
Cardiology Clinic Note   Patient Name: Chad Washington Date of Encounter: 10/05/2021  Primary Care Provider:  [name], MD Primary Cardiologist:  [name], MD  Patient Profile     Chad Washington 80 year old male presents the clinic today for follow-up evaluation of his coronary artery disease status post CABG x4 on 06/07/2021.  Roxan Hockey)  Past Medical History    Past Medical History:  Diagnosis Date   Asthma    allergy induced asthma   GERD (gastroesophageal reflux disease)    Past Surgical History:  Procedure Laterality Date   COLONOSCOPY WITH PROPOFOL N/A 06/21/2017   Procedure: COLONOSCOPY WITH PROPOFOL;  Surgeon: [name], MD;  Location: [facility];  Service: Endoscopy;  Laterality: N/A;   CORONARY ARTERY BYPASS GRAFT N/A 06/06/2021   Procedure: CORONARY ARTERY BYPASS GRAFTING (CABG) TIMES FOUR, ON PUMP, USING LEFT INTERNAL MAMMARY ARTERY AND ENDOSCOPICALLY HARVESTED RIGHT GREATER SAPHENOUS VEIN;  Surgeon: [name], MD;  Location: [place];  Service: Open Heart Surgery;  Laterality: N/A;   ENDOVEIN HARVEST OF GREATER SAPHENOUS VEIN Right 06/06/2021   Procedure: ENDOVEIN HARVEST OF GREATER SAPHENOUS VEIN;  Surgeon: [name], MD;  Location: [place];  Service: Open Heart Surgery;  Laterality: Right;   EYE SURGERY Bilateral 6 yrs ago   ioc lens implants   HERNIA REPAIR     inguinal hernia left 8- 10 yrs ago, right inguinal hernia age 44   IABP INSERTION N/A 06/06/2021   Procedure: IABP Insertion;  Surgeon: [name], MD;  Location: [place] [facility];  Service: Cardiovascular;  Laterality: N/A;   LEFT HEART CATH AND CORONARY ANGIOGRAPHY N/A 06/06/2021   Procedure: LEFT HEART CATH AND CORONARY ANGIOGRAPHY;  Surgeon: [name], MD;  Location: [place] [facility];  Service: Cardiovascular;  Laterality: N/A;   LEFT HEART CATH AND CORS/GRAFTS ANGIOGRAPHY N/A 06/12/2021   Procedure: LEFT HEART CATH AND CORS/GRAFTS  ANGIOGRAPHY;  Surgeon: [name], MD;  Location: [place] [facility];  Service: Cardiovascular;  Laterality: N/A;   TEE WITHOUT CARDIOVERSION  06/06/2021   Procedure: TRANSESOPHAGEAL ECHOCARDIOGRAM (TEE);  Surgeon: [name], MD;  Location: [facility];  Service: Open Heart Surgery;;    Allergies  Allergies  Allergen Reactions   Latex Rash    History of Present Illness    [name] has a PMH of acute STEMI, hyperlipidemia, ischemic cardiomyopathy, acute on chronic combined systolic and diastolic CHF, coronary artery disease status post CABG x4 on 06/07/2021.  He was admitted through the emergency department and urgently taken to the cardiac Cath Lab and then to the operating room.  We received LIMA to LAD, SVG to diagonal and obtuse marginals 1 and 2.  He tolerated the surgery well and was transferred in stable condition to surgical intensive care.  He remained hemodynamically stable.  His renal function remained normal.  His chest tubes were removed on postop day 1.  He was mobilized in a routine manner.  He was noted to be tachycardic and hypertensive.  His Lopressor was increased.  He maintained normal sinus rhythm and was transferred to progressive care on 06/09/2021.  He remained tachycardic and hypertensive.  His metoprolol was further increased.  He was also initiated on low-dose losartan at that time.  His CXR showed a small left pleural effusion.  His EKG showed ST segment elevation in anterior leads.  Cardiology was consulted.  An echocardiogram was performed which showed a reduction in his EF  and worsening anterior wall motion.  There was concern for early failure of his bypass grafts and cardiac catheterization was recommended.  Cardiac catheterization was performed on 06/12/2020 showed patency of his coronary artery bypass grafts.  Further titration of his beta-blocker was recommended.  His pacing wires were removed without difficulty.  His surgical incisions were healing  well without evidence of/signs of infection.  He was discharged in stable condition to [facility] on 06/15/2021.   He presented to the clinic 07/02/2021 for follow-up evaluation stated he felt well.  He had been slowly increasing his physical activity and was doing physical therapy exercises, balance exercises, and had been maintaining his sternal precautions doing chair exercises.  We reviewed his EKG  which showed a heart rate of 130 bpm.  He reported compliance with his metoprolol.  On recheck his heart rate was 120 after being seated for a few minutes.  He continued to have slight right ankle edema.  I asked him to continue his lower extremity support stockings, continue heart healthy diet, increase metoprolol to 150 mg twice daily, follow-up with Dr. Roxan Hockey as scheduled. Follow-up with cardiology was planned for 1 month.  He followed up with cardiothoracic surgery 07/07/2021.  He was not able to tolerate his increased metoprolol and was continued on losartan and metoprolol 100 mg daily.  He was asked to continue his sternal precautions and increase his physical activity as tolerated.  He followed up with Dr. Roxan Hockey on 08/04/2021.  During that time his pulse had decreased to 71 bpm.  He was driving on a limited basis.  Restrictions were removed from his activities.  He was instructed to gradually increase his physical activity.  He presents to the clinic today for follow-up evaluation states he feels well.  We reviewed his metoprolol dosing.  He did not tolerate 150 mg of metoprolol and was reduced to 100 mg.  Cardiothoracic surgery also discontinued his losartan.  His blood pressure today is 90/60.  He has been increasing his physical activity and is now either walking or doing exercises in the gym for 30 to 45 minutes/day.  He is following a heart healthy low-sodium diet.  He remains active on the [employer].  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  We will continue his current medication  regimen, repeat his echocardiogram, and plan follow-up for 6 months.  He reports that his wife has had to EKGs that showed changes.  He took her to [facility] long hospital due to altered mental status.  She has an appointment with Dr. Marlou Porch November 12.  He would like her to be seen sooner.  We discussed placing her on the cancellation list.   Today he denies chest pain, shortness of breath, lower extremity edema, fatigue, palpitations, melena, hematuria, hemoptysis, diaphoresis, weakness, presyncope, syncope, orthopnea, and PND.  Home Medications    Prior to Admission medications   Medication Sig Start Date End Date Taking? Authorizing Provider  albuterol (VENTOLIN HFA) 108 (90 Base) MCG/ACT inhaler Inhale 1-2 puffs into the lungs every 6 (six) hours as needed. 03/25/21   [provider]  aspirin EC 325 MG EC tablet Take 1 tablet (325 mg total) by mouth daily. 06/15/21   Barrett, Erin R, PA-C  atorvastatin (LIPITOR) 40 MG tablet Take 1 tablet (40 mg total) by mouth daily. 06/15/21   Barrett, Erin R, PA-C  azelastine (OPTIVAR) 0.05 % ophthalmic solution Place 1 drop into both eyes 2 (two) times daily.    [provider]  cetirizine (ZYRTEC) 10 MG tablet Take 10 mg by mouth daily at 6 PM.    [provider]  dexlansoprazole (DEXILANT) 60 MG capsule Take 60 mg by mouth daily.    [provider]  EPINEPHrine 0.3 mg/0.3 mL IJ SOAJ injection Inject 0.3 mg into the muscle as needed (allergic reaction).    [provider]  fluticasone (FLOVENT HFA) 110 MCG/ACT inhaler Inhale 1 puff into the lungs 2 (two) times daily.    [provider]  furosemide (LASIX) 20 MG tablet Take 1 tablet (20 mg total) by mouth daily. 06/15/21   Barrett, Erin R, PA-C  guaiFENesin (MUCINEX) 600 MG 12 hr tablet Take 1 tablet (600 mg total) by mouth 2 (two) times daily as needed. 06/15/21   Barrett, Erin R, PA-C  losartan (COZAAR) 25 MG tablet Take 1 tablet (25 mg total) by mouth  daily. 06/15/21   Barrett, Erin R, PA-C  metoprolol tartrate (LOPRESSOR) 100 MG tablet Take 1.5 tablets (150 mg total) by mouth 2 (two) times daily. 07/02/21   Deberah Pelton, NP  montelukast (SINGULAIR) 10 MG tablet Take 10 mg by mouth daily at 6 PM.    [provider]  Multiple Vitamins-Minerals (CENTRUM SILVER PO) Take 1 tablet by mouth daily.    [provider]  potassium chloride (KLOR-CON) 10 MEQ tablet Take 1 tablet (10 mEq total) by mouth daily. 06/15/21   Barrett, Lodema Hong, PA-C    Family History    Family History  Problem Relation Age of Onset   Hypertension Mother    Cancer Father    Diabetes Father    He indicated that his mother is deceased. He indicated that his father is deceased.  Social History    Social History   Socioeconomic History   Marital status: Married    Spouse name: Not on file   Number of children: Not on file   Years of education: Not on file   Highest education level: Not on file  Occupational History   Not on file  Tobacco Use   Smoking status: Never   Smokeless tobacco: Never  Vaping Use   Vaping Use: Never used  Substance and Sexual Activity   Alcohol use: Yes    Alcohol/week: 0.0 standard drinks    Comment: occasionally   Drug use: No   Sexual activity: Yes    Partners: Male  Other Topics Concern   Not on file  Social History Narrative   Not on file   Social Determinants of Health   Financial Resource Strain: Not on file  Food Insecurity: Not on file  Transportation Needs: Not on file  Physical Activity: Not on file  Stress: Not on file  Social Connections: Not on file  Intimate Partner Violence: Not on file     Review of Systems    General:  No chills, fever, night sweats or weight changes.  Cardiovascular:  No chest pain, dyspnea on exertion, edema, orthopnea, palpitations, paroxysmal nocturnal dyspnea. Dermatological: No rash, lesions/masses Respiratory: No cough, dyspnea Urologic: No hematuria,  dysuria Abdominal:   No nausea, vomiting, diarrhea, bright red blood per rectum, melena, or hematemesis Neurologic:  No visual changes, wkns, changes in mental status. All other systems reviewed and are otherwise negative except as noted above.  Physical Exam    VS:  BP 90/60 (BP Location: Left Arm)   Pulse 61   Ht 6' (1.829 m)   Wt 171 lb 9.6 oz (77.8 kg)   SpO2 98%  BMI 23.27 kg/m  , BMI Body mass index is 23.27 kg/m. GEN: Well nourished, well developed, in no acute distress. HEENT: normal. Neck: Supple, no JVD, carotid bruits, or masses. Cardiac: RRR, no murmurs, rubs, or gallops. No clubbing, cyanosis, edema.  Radials/DP/PT 2+ and equal bilaterally.  Respiratory:  Respirations regular and unlabored, clear to auscultation bilaterally. GI: Soft, nontender, nondistended, BS + x 4. MS: no deformity or atrophy. Skin: warm and dry, no rash. Neuro:  Strength and sensation are intact. Psych: Normal affect.  Accessory Clinical Findings    Recent Labs: 06/06/2021: ALT 29 06/08/2021: Magnesium 2.6 06/10/2021: Hemoglobin 12.5; Platelets 129 06/14/2021: B Natriuretic Peptide 764.2 06/15/2021: BUN 18; Creatinine, Ser 0.99; Potassium 3.7; Sodium 136   Recent Lipid Panel    Component Value Date/Time   CHOL 168 06/06/2021 2355   TRIG 46 06/06/2021 2355   HDL 37 (L) 06/06/2021 2355   CHOLHDL 4.5 06/06/2021 2355   VLDL 9 06/06/2021 2355   LDLCALC 122 (H) 06/06/2021 2355    ECG personally reviewed by me today-none today.  EKG 07/02/2021 sinus tachycardia with occasional premature ventricular complexes rightward axis deviation septal infarct undetermined age T wave abnormality consider anterior lateral ischemia.   Echocardiogram 06/11/2021 IMPRESSIONS     1. Left ventricular ejection fraction, by estimation, is 35 to 40%. The  left ventricle has moderately decreased function. The left ventricle  demonstrates regional wall motion abnormalities (see scoring  diagram/findings for  description). There is  akinesis of the left ventricular, apical segment. There is akinesis of the  left ventricular, apical septal wall, lateral wall, anterior wall and  inferolateral wall. There is hypokinesis of the left ventricular,  basal-mid anterior wall.   2. Right ventricular systolic function is normal. The right ventricular  size is normal.   3. The mitral valve is normal in structure. No evidence of mitral valve  regurgitation. No evidence of mitral stenosis.   4. The aortic valve is normal in structure. Aortic valve regurgitation is  not visualized. No aortic stenosis is present.   5. The inferior vena cava is normal in size with greater than 50%  respiratory variability, suggesting right atrial pressure of 3 mmHg.   Cardiac catheterization 06/07/2019 Ramus lesion is 85% stenosed. Dist LM to Ost LAD lesion is 95% stenosed. Ost Cx lesion is 95% stenosed. Prox LAD lesion is 95% stenosed. 1st Diag lesion is 70% stenosed. Prox RCA lesion is 30% stenosed. RPDA lesion is 50% stenosed.   Severe 95% distal left main stenosis percent proximal LAD stenosis, 70% diffuse first diagonal stenosis, 95% ostial left circumflex stenosis.  The RCA has mild luminal irregularity with narrowings of 20 and 30%.  There is a 50% ostial narrowing and a small PDA vessel.   Acute LV dysfunction with EF estimate approximately 50% with mild distal anterior lateral/apical hypocontractility.  LVEDP 23 mmHg.   Insertion of intra-aortic balloon pump for hemodynamic support prior to emergent CaBG surgery this evening.   RECOMMENDATION: The patient will be taken to the operating room by Dr. Modesto Charon emergently for critical left main stenosis with subtotal LAD and ostial circumflex stenoses.   Diagnostic Dominance: Right     Intervention     Cardiac catheterization 06/12/2021 Dist LM to Prox LAD lesion is 95% stenosed. Prox LAD to Mid LAD lesion is 95% stenosed. Ost Cx to Prox Cx lesion is  95% stenosed. 1st Diag lesion is 70% stenosed. Ramus lesion is 85% stenosed. Prox RCA lesion is 30% stenosed. RPDA lesion  is 50% stenosed. ----Grafts---- LIMA graft was visualized by angiography and is normal in caliber. The graft exhibits no disease. Seq SVG- OM1-OM2 graft was visualized by angiography and is large. The graft exhibits no disease. SVG-1st Diag graft was visualized by angiography and is large. Mid Graft lesion is 50% stenosed. ---------------- There is moderate left ventricular systolic dysfunction. The left ventricular ejection fraction is 35-45% by visual estimate. LV end diastolic pressure is normal.   SUMMARY Severe left main, LAD and LCx disease-stable compared to initial cath with minimal RCA disease. Widely patent LIMA-LAD, sequential SVG-OM1-OM2, patent SVG-D1 with mid vessel valve causing turbulent flow and possibly 40 to 50% stenosis.  Not flow-limiting. Moderate reduced LV function with EF of roughly 35 to 40% with basal to mid anterior anterolateral hypokinesis, but very tachycardic, therefore difficult to fully assess     RECOMMENDATIONS Return to nursing unit for ongoing care.  Medical management. Titrate beta-blocker for improved rate control (was just increased to 50 mg yesterday) Consider recheck echo in 3 months.       Glenetta Hew, MD   Diagnostic Dominance: Right     Intervention    Assessment & Plan   1.   Coronary artery disease-status post CABG x4-no chest pain today.  Underwent cardiac catheterization 06/06/2021.  Underwent CABG x4 on 06/07/2021.  Underwent cardiac catheterization on 06/12/2021 and was found to have 50% stenosis on first diagonal graft.  Remaining grafts were widely patent. Continue metoprolol, atorvastatin,  furosemide, potassium.   Heart healthy low-sodium diet-salty 6 given Continue to increase physical activity as tolerated   Sinus tachycardia-heart rate today 61.  EKG 07/02/2021 shows sinus tachycardia with occasional  premature ventricular complexes.  Cardiac unaware. Continue metoprolol Maintain p.o. hydration Avoid triggers for palpitations caffeine, chocolate, EtOH, dehydration etc.   Ischemic cardiomyopathy-no increased DOE or activity intolerance.  Echocardiogram showed an EF of 35-40%. Continue  metoprolol Heart healthy low-sodium diet-salty 6 given Unable to continue to tolerate guideline directed medical therapy due to blood pressure. Repeat echocardiogram   Hyperlipidemia-06/06/2021: Cholesterol 168; HDL 37; LDL Cholesterol 122; Triglycerides 46; VLDL 9 Continue atorvastatin, aspirin Heart healthy low-sodium high-fiber diet Increase physical activity as tolerated Repeat fasting lipids and LFTs  Essential hypertension-BP today 90/60.  Well-controlled at home. Continue metoprolol, furosemide Heart healthy low-sodium diet-salty 6 given Increase physical activity as tolerated   Disposition: Follow-up with Dr. Claiborne Billings in 6 months.   Jossie Ng. Omare Bilotta NP-C    10/05/2021, 12:28 PM [address] Office [phone] Fax [phone]  Notice: This dictation was prepared with Dragon dictation along with smaller phrase technology. Any transcriptional errors that result from this process are unintentional and may not be corrected upon review.  I spent 14 minutes examining this patient, reviewing medications, and using patient centered shared decision making involving her cardiac care.  Prior to her visit I spent greater than 20 minutes reviewing her past medical history,  medications, and prior cardiac tests.

## 2021-10-05 ENCOUNTER — Other Ambulatory Visit: Payer: Self-pay

## 2021-10-05 ENCOUNTER — Ambulatory Visit: Payer: Medicare PPO | Admitting: General Practice

## 2021-10-05 ENCOUNTER — Encounter: Payer: Self-pay | Admitting: General Practice

## 2021-10-05 VITALS — BP 90/60 | HR 61 | Ht 72.0 in | Wt 171.6 lb

## 2021-10-05 DIAGNOSIS — I255 Ischemic cardiomyopathy: Secondary | ICD-10-CM | POA: Diagnosis not present

## 2021-10-05 DIAGNOSIS — I10 Essential (primary) hypertension: Secondary | ICD-10-CM | POA: Diagnosis not present

## 2021-10-05 DIAGNOSIS — Z951 Presence of aortocoronary bypass graft: Secondary | ICD-10-CM

## 2021-10-05 DIAGNOSIS — R000 Tachycardia, unspecified: Secondary | ICD-10-CM | POA: Diagnosis not present

## 2021-10-05 DIAGNOSIS — E7800 Pure hypercholesterolemia, unspecified: Secondary | ICD-10-CM

## 2021-10-05 NOTE — Patient Instructions (Signed)
Medication Instructions:  The current medical regimen is effective;  continue present plan and medications as directed. Please refer to the Current Medication list given to you today.  *If you need a refill on your cardiac medications before your next appointment, please call your pharmacy*  Lab Work: NONE  Testing/Procedures: Echocardiogram - Your physician has requested that you have an echocardiogram. Echocardiography is a painless test that uses sound waves to create images of your heart. It provides your doctor with information about the size and shape of your heart and how well your heart's chambers and valves are working. This procedure takes approximately one hour. There are no restrictions for this procedure. This will be performed at our [facility] location - [address], [address].  Special Instructions PLEASE READ AND FOLLOW SALTY 6-ATTACHED-1,800 mg daily  PLEASE MAINTAIN PHYSICAL ACTIVITY AS TOLERATED  Follow-Up: Your next appointment:  6 month(s) In Person with [name], MD ONLY  Please call our office 2 months in advance to schedule this appointment   At [facility], you and your health needs are our priority.  As part of our continuing mission to provide you with exceptional heart care, we have created designated Provider Care Teams.  These Care Teams include your primary Cardiologist (physician) and Advanced Practice Providers (APPs -  Physician Assistants and Nurse Practitioners) who all work together to provide you with the care you need, when you need it.

## 2021-10-19 DIAGNOSIS — J3089 Other allergic rhinitis: Secondary | ICD-10-CM | POA: Diagnosis not present

## 2021-10-19 DIAGNOSIS — J3081 Allergic rhinitis due to animal (cat) (dog) hair and dander: Secondary | ICD-10-CM | POA: Diagnosis not present

## 2021-10-19 DIAGNOSIS — J301 Allergic rhinitis due to pollen: Secondary | ICD-10-CM | POA: Diagnosis not present

## 2021-10-20 ENCOUNTER — Ambulatory Visit (HOSPITAL_COMMUNITY): Payer: Medicare PPO | Attending: Internal Medicine

## 2021-10-20 ENCOUNTER — Other Ambulatory Visit: Payer: Self-pay

## 2021-10-20 DIAGNOSIS — I255 Ischemic cardiomyopathy: Secondary | ICD-10-CM | POA: Insufficient documentation

## 2021-10-20 LAB — ECHOCARDIOGRAM COMPLETE
Area-P 1/2: 3.03 cm2
S' Lateral: 3.6 cm

## 2021-10-20 MED ORDER — PERFLUTREN LIPID MICROSPHERE
1.0000 mL | INTRAVENOUS | Status: AC | PRN
  Administered 2021-10-20: 1 mL via INTRAVENOUS

## 2021-10-22 DIAGNOSIS — E785 Hyperlipidemia, unspecified: Secondary | ICD-10-CM | POA: Diagnosis not present

## 2021-10-22 DIAGNOSIS — I509 Heart failure, unspecified: Secondary | ICD-10-CM | POA: Diagnosis not present

## 2021-10-22 DIAGNOSIS — N400 Enlarged prostate without lower urinary tract symptoms: Secondary | ICD-10-CM | POA: Diagnosis not present

## 2021-10-22 DIAGNOSIS — I255 Ischemic cardiomyopathy: Secondary | ICD-10-CM | POA: Diagnosis not present

## 2021-10-22 DIAGNOSIS — Z0000 Encounter for general adult medical examination without abnormal findings: Secondary | ICD-10-CM | POA: Diagnosis not present

## 2021-10-22 DIAGNOSIS — I25810 Atherosclerosis of coronary artery bypass graft(s) without angina pectoris: Secondary | ICD-10-CM | POA: Diagnosis not present

## 2021-10-22 DIAGNOSIS — Z1389 Encounter for screening for other disorder: Secondary | ICD-10-CM | POA: Diagnosis not present

## 2021-11-04 DIAGNOSIS — J301 Allergic rhinitis due to pollen: Secondary | ICD-10-CM | POA: Diagnosis not present

## 2021-11-04 DIAGNOSIS — J3089 Other allergic rhinitis: Secondary | ICD-10-CM | POA: Diagnosis not present

## 2021-11-04 DIAGNOSIS — J3081 Allergic rhinitis due to animal (cat) (dog) hair and dander: Secondary | ICD-10-CM | POA: Diagnosis not present

## 2021-11-23 DIAGNOSIS — J3089 Other allergic rhinitis: Secondary | ICD-10-CM | POA: Diagnosis not present

## 2021-11-23 DIAGNOSIS — J3081 Allergic rhinitis due to animal (cat) (dog) hair and dander: Secondary | ICD-10-CM | POA: Diagnosis not present

## 2021-11-23 DIAGNOSIS — J301 Allergic rhinitis due to pollen: Secondary | ICD-10-CM | POA: Diagnosis not present

## 2021-12-22 DIAGNOSIS — Z043 Encounter for examination and observation following other accident: Secondary | ICD-10-CM | POA: Diagnosis not present

## 2021-12-23 DIAGNOSIS — I428 Other cardiomyopathies: Secondary | ICD-10-CM | POA: Diagnosis not present

## 2021-12-23 DIAGNOSIS — I5043 Acute on chronic combined systolic (congestive) and diastolic (congestive) heart failure: Secondary | ICD-10-CM | POA: Diagnosis not present

## 2021-12-24 ENCOUNTER — Other Ambulatory Visit: Payer: Self-pay

## 2021-12-24 ENCOUNTER — Emergency Department (HOSPITAL_COMMUNITY): Payer: Medicare PPO

## 2021-12-24 ENCOUNTER — Emergency Department (HOSPITAL_COMMUNITY)
Admission: EM | Admit: 2021-12-24 | Discharge: 2021-12-24 | Disposition: A | Discharge status: Home / Self Care | Payer: Medicare PPO | Attending: Emergency Medicine | Admitting: Emergency Medicine

## 2021-12-24 DIAGNOSIS — S46912A Strain of unspecified muscle, fascia and tendon at shoulder and upper arm level, left arm, initial encounter: Secondary | ICD-10-CM | POA: Diagnosis not present

## 2021-12-24 DIAGNOSIS — S0990XA Unspecified injury of head, initial encounter: Secondary | ICD-10-CM | POA: Diagnosis not present

## 2021-12-24 DIAGNOSIS — S4992XA Unspecified injury of left shoulder and upper arm, initial encounter: Secondary | ICD-10-CM | POA: Insufficient documentation

## 2021-12-24 DIAGNOSIS — W19XXXA Unspecified fall, initial encounter: Secondary | ICD-10-CM | POA: Diagnosis not present

## 2021-12-24 DIAGNOSIS — W1839XA Other fall on same level, initial encounter: Secondary | ICD-10-CM | POA: Diagnosis not present

## 2021-12-24 DIAGNOSIS — R7309 Other abnormal glucose: Secondary | ICD-10-CM | POA: Diagnosis not present

## 2021-12-24 DIAGNOSIS — I2510 Atherosclerotic heart disease of native coronary artery without angina pectoris: Secondary | ICD-10-CM | POA: Insufficient documentation

## 2021-12-24 DIAGNOSIS — Z91040 Latex allergy status: Secondary | ICD-10-CM | POA: Insufficient documentation

## 2021-12-24 DIAGNOSIS — Z79899 Other long term (current) drug therapy: Secondary | ICD-10-CM | POA: Insufficient documentation

## 2021-12-24 DIAGNOSIS — I5043 Acute on chronic combined systolic (congestive) and diastolic (congestive) heart failure: Secondary | ICD-10-CM | POA: Insufficient documentation

## 2021-12-24 DIAGNOSIS — M1612 Unilateral primary osteoarthritis, left hip: Secondary | ICD-10-CM | POA: Diagnosis not present

## 2021-12-24 DIAGNOSIS — Z7982 Long term (current) use of aspirin: Secondary | ICD-10-CM | POA: Insufficient documentation

## 2021-12-24 DIAGNOSIS — J45909 Unspecified asthma, uncomplicated: Secondary | ICD-10-CM | POA: Insufficient documentation

## 2021-12-24 DIAGNOSIS — E559 Vitamin D deficiency, unspecified: Secondary | ICD-10-CM | POA: Diagnosis not present

## 2021-12-24 DIAGNOSIS — M6281 Muscle weakness (generalized): Secondary | ICD-10-CM | POA: Diagnosis not present

## 2021-12-24 DIAGNOSIS — M19012 Primary osteoarthritis, left shoulder: Secondary | ICD-10-CM | POA: Diagnosis not present

## 2021-12-24 DIAGNOSIS — R9431 Abnormal electrocardiogram [ECG] [EKG]: Secondary | ICD-10-CM | POA: Diagnosis not present

## 2021-12-24 DIAGNOSIS — Z043 Encounter for examination and observation following other accident: Secondary | ICD-10-CM | POA: Diagnosis not present

## 2021-12-24 DIAGNOSIS — M79603 Pain in arm, unspecified: Secondary | ICD-10-CM | POA: Diagnosis not present

## 2021-12-24 LAB — CBC WITH DIFFERENTIAL/PLATELET
Abs Immature Granulocytes: 0.07 10*3/uL (ref 0.00–0.07)
Basophils Absolute: 0.1 10*3/uL (ref 0.0–0.1)
Basophils Relative: 1 %
Eosinophils Absolute: 0.5 10*3/uL (ref 0.0–0.5)
Eosinophils Relative: 5 %
HCT: 43.9 % (ref 39.0–52.0)
Hemoglobin: 14.6 g/dL (ref 13.0–17.0)
Immature Granulocytes: 1 %
Lymphocytes Relative: 10 %
Lymphs Abs: 0.8 10*3/uL (ref 0.7–4.0)
MCH: 31.7 pg (ref 26.0–34.0)
MCHC: 33.3 g/dL (ref 30.0–36.0)
MCV: 95.2 fL (ref 80.0–100.0)
Monocytes Absolute: 0.9 10*3/uL (ref 0.1–1.0)
Monocytes Relative: 11 %
Neutro Abs: 6.2 10*3/uL (ref 1.7–7.7)
Neutrophils Relative %: 72 %
Platelets: 302 10*3/uL (ref 150–400)
RBC: 4.61 MIL/uL (ref 4.22–5.81)
RDW: 13.4 % (ref 11.5–15.5)
WBC: 8.6 10*3/uL (ref 4.0–10.5)
nRBC: 0 % (ref 0.0–0.2)

## 2021-12-24 LAB — BASIC METABOLIC PANEL
Anion gap: 7 (ref 5–15)
BUN: 16 mg/dL (ref 8–23)
CO2: 29 mmol/L (ref 22–32)
Calcium: 8.9 mg/dL (ref 8.9–10.3)
Chloride: 101 mmol/L (ref 98–111)
Creatinine, Ser: 0.92 mg/dL (ref 0.61–1.24)
GFR, Estimated: >60 mL/min (ref >60)
Glucose, Bld: 91 mg/dL (ref 70–99)
Potassium: 3.8 mmol/L (ref 3.5–5.1)
Sodium: 137 mmol/L (ref 135–145)

## 2021-12-24 LAB — PROTIME-INR
INR: 1 (ref 0.8–1.2)
Prothrombin Time: 13.3 seconds (ref 11.4–15.2)

## 2021-12-24 LAB — TROPONIN I (HIGH SENSITIVITY): Troponin I (High Sensitivity): 15 ng/L (ref <18)

## 2021-12-24 LAB — APTT: aPTT: 29 seconds (ref 24–36)

## 2021-12-24 MED ORDER — TRAMADOL HCL 50 MG PO TABS: 50.0000 mg | ORAL_TABLET | Freq: Four times a day (QID) | ORAL | 0 refills | Status: DC | PRN

## 2021-12-24 NOTE — ED Provider Notes (Signed)
Emergency Medicine Provider Triage Evaluation Note  Chad Washington , a 80 y.o. male  was evaluated in triage.  Pt complains of left arm pain onset 5 days. Pt with a mechanical fall 5 days ago.  He notes that he struck his left ear on the toilet paper holder.  He was evaluated by his primary care provider who informed that he should be evaluated in the ED.  He has not tried medications for his symptoms. Denies LOC, dizziness, lightheadedness, chest pain, shortness of breath, abdominal pain, nausea, vomiting, fever, chills. Denies anticoagulant use.   Review of Systems  Positive: Left arm pain, left hip pain Negative: Hitting his head, LOC  Physical Exam  BP 112/69 (BP Location: Left Arm)    Pulse 84    Temp 98.7 F (37.1 C) (Oral)    Resp 14    SpO2 98%  Gen:   Awake, no distress   Resp:  Normal effort  MSK:   Moves extremities without difficulty  Other:  No focal neurological deficit. PERRL. EOMI. decreased range of motion of left shoulder secondary to pain.  No overlying skin changes.  Full active range of motion of bilateral hips.  Strength and sensation intact to bilateral lower extremities.  Medical Decision Making  Medically screening exam initiated at 2:19 PM.  Appropriate orders placed.  Chad Washington was informed that the remainder of the evaluation will be completed by another provider, this initial triage assessment does not replace that evaluation, and the importance of remaining in the ED until their evaluation is complete.   Brunetta Newingham A, PA-C 12/24/21 1432    Wyvonnia Dusky, MD 12/24/21 [phone]

## 2021-12-24 NOTE — ED Notes (Signed)
Pt verbalized understanding of d/c instructions, meds, and followup care. Denies questions. VSS, no distress noted. Steady gait to exit with all belongings.  ?

## 2021-12-24 NOTE — ED Provider Notes (Signed)
[place] EMERGENCY DEPARTMENT Provider Note   CSN: [id number] Arrival date & time: 12/24/21  1354     History Chief Complaint  Patient presents with   Arm Pain    Chad Washington is a 80 y.o. male here with asthma, MI status post stent, here presenting with left shoulder pain.  Patient states that he fell 5 days ago onto the left shoulder.  He has been having pain in the left shoulder since then.  He states that he also may have hit his head as well.  Patient actually had x-rays done in his assisted living [facility]) that were unremarkable.  He states that he had heart enzyme test done yesterday and the troponin is 35.  Patient denies having chest pain.  The NP at the facility just wants to make sure that patient does not have intra cranial bleeding or shoulder issue or heart attack.   The history is provided by the patient.      Past Medical History:  Diagnosis Date   Asthma    allergy induced asthma   GERD (gastroesophageal reflux disease)     Patient Active Problem List   Diagnosis Date Noted   Acute on chronic combined systolic and diastolic CHF (congestive heart failure) (HCC)    Left main coronary artery disease    Abnormal echocardiogram    Ischemic cardiomyopathy    Status post surgery 06/07/2021   Acute ST elevation myocardial infarction (STEMI) of anterior wall ([place]) 06/07/2021   S/P CABG x 4 06/07/2021    Past Surgical History:  Procedure Laterality Date   COLONOSCOPY WITH PROPOFOL N/A 06/21/2017   Procedure: COLONOSCOPY WITH PROPOFOL;  Surgeon: [name], MD;  Location: [facility];  Service: Endoscopy;  Laterality: N/A;   CORONARY ARTERY BYPASS GRAFT N/A 06/06/2021   Procedure: CORONARY ARTERY BYPASS GRAFTING (CABG) TIMES FOUR, ON PUMP, USING LEFT INTERNAL MAMMARY ARTERY AND ENDOSCOPICALLY HARVESTED RIGHT GREATER SAPHENOUS VEIN;  Surgeon: [name], MD;  Location: [place];  Service: Open Heart Surgery;  Laterality:  N/A;   ENDOVEIN HARVEST OF GREATER SAPHENOUS VEIN Right 06/06/2021   Procedure: ENDOVEIN HARVEST OF GREATER SAPHENOUS VEIN;  Surgeon: [name], MD;  Location: [facility];  Service: Open Heart Surgery;  Laterality: Right;   EYE SURGERY Bilateral 6 yrs ago   ioc lens implants   HERNIA REPAIR     inguinal hernia left 8- 10 yrs ago, right inguinal hernia age 65   IABP INSERTION N/A 06/06/2021   Procedure: IABP Insertion;  Surgeon: [name], MD;  Location: [place] CV LAB;  Service: Cardiovascular;  Laterality: N/A;   LEFT HEART CATH AND CORONARY ANGIOGRAPHY N/A 06/06/2021   Procedure: LEFT HEART CATH AND CORONARY ANGIOGRAPHY;  Surgeon: [name], MD;  Location: [place] [facility];  Service: Cardiovascular;  Laterality: N/A;   LEFT HEART CATH AND CORS/GRAFTS ANGIOGRAPHY N/A 06/12/2021   Procedure: LEFT HEART CATH AND CORS/GRAFTS ANGIOGRAPHY;  Surgeon: [name], MD;  Location: [place] [facility];  Service: Cardiovascular;  Laterality: N/A;   TEE WITHOUT CARDIOVERSION  06/06/2021   Procedure: TRANSESOPHAGEAL ECHOCARDIOGRAM (TEE);  Surgeon: [name], MD;  Location: [facility] OR;  Service: Open Heart Surgery;;       Family History  Problem Relation Age of Onset   Hypertension Mother    Cancer Father    Diabetes Father     Social History   Tobacco Use   Smoking status: Never  Smokeless tobacco: Never  Vaping Use   Vaping Use: Never used  Substance Use Topics   Alcohol use: Yes    Alcohol/week: 0.0 standard drinks    Comment: occasionally   Drug use: No    Home Medications Prior to Admission medications   Medication Sig Start Date End Date Taking? Authorizing Provider  aspirin EC 325 MG EC tablet Take 1 tablet (325 mg total) by mouth daily. 06/15/21  Yes Barrett, Erin R, PA-C  atorvastatin (LIPITOR) 40 MG tablet Take 1 tablet (40 mg total) by mouth daily. 06/15/21  Yes Barrett, Erin R, PA-C  azelastine (OPTIVAR) 0.05 % ophthalmic solution Place 1 drop  into both eyes 2 (two) times daily.   Yes [provider]  cetirizine (ZYRTEC) 10 MG tablet Take 10 mg by mouth daily.   Yes [provider]  EPINEPHrine 0.3 mg/0.3 mL IJ SOAJ injection Inject 0.3 mg into the muscle once as needed for anaphylaxis.   Yes [provider]  fluticasone (FLOVENT HFA) 110 MCG/ACT inhaler Inhale 1 puff into the lungs 2 (two) times daily.   Yes [provider]  furosemide (LASIX) 20 MG tablet Take 1 tablet (20 mg total) by mouth daily. 06/15/21  Yes Barrett, Erin R, PA-C  metoprolol tartrate (LOPRESSOR) 100 MG tablet Take 1.5 tablets (150 mg total) by mouth 2 (two) times daily. Patient taking differently: Take 100 mg by mouth 2 (two) times daily. 07/02/21  Yes Cleaver, Jossie Ng, NP  montelukast (SINGULAIR) 10 MG tablet Take 10 mg by mouth at bedtime.   Yes [provider]  Multiple Vitamins-Minerals (CENTRUM SILVER PO) Take 1 tablet by mouth daily.   Yes [provider]  omeprazole (PRILOSEC) 20 MG capsule Take 20 mg by mouth daily.   Yes [provider]  potassium chloride (KLOR-CON) 10 MEQ tablet Take 1 tablet (10 mEq total) by mouth daily. 06/15/21  Yes Barrett, Erin R, PA-C  traMADol (ULTRAM) 50 MG tablet Take 1 tablet (50 mg total) by mouth every 6 (six) hours as needed. 12/24/21  Yes Drenda Freeze, MD  amoxicillin-clavulanate (AUGMENTIN) 875-125 MG tablet Take 1 tablet by mouth 2 (two) times daily. 12/15/21   [provider]  diclofenac Sodium (VOLTAREN) 1 % GEL Apply topically. Patient not taking: Reported on 12/24/2021 12/24/21   [provider]  guaiFENesin (MUCINEX) 600 MG 12 hr tablet Take 1 tablet (600 mg total) by mouth 2 (two) times daily as needed. Patient not taking: Reported on 12/24/2021 06/15/21   Barrett, Lodema Hong, PA-C  nystatin (MYCOSTATIN) 100000 UNIT/ML suspension Take by mouth. Patient not taking: Reported on 12/24/2021 12/24/21   [provider]    Allergies     Augmentin [amoxicillin-pot clavulanate] and Latex  Review of Systems   Review of Systems  Musculoskeletal:        Left shoulder pain  All other systems reviewed and are negative.  Physical Exam Updated Vital Signs BP 111/80 (BP Location: Right Arm)    Pulse 95    Temp 98.5 F (36.9 C) (Oral)    Resp 18    SpO2 99%   Physical Exam Vitals and nursing note reviewed.  Constitutional:      Appearance: Normal appearance.  HENT:     Head: Normocephalic.     Comments: Questionable abrasion on the left scalp, no obvious laceration    Nose: Nose normal.     Mouth/Throat:     Mouth: Mucous membranes are moist.  Eyes:  Extraocular Movements: Extraocular movements intact.     Pupils: Pupils are equal, round, and reactive to light.  Cardiovascular:     Rate and Rhythm: Normal rate and regular rhythm.     Pulses: Normal pulses.     Heart sounds: Normal heart sounds.  Pulmonary:     Effort: Pulmonary effort is normal.     Breath sounds: Normal breath sounds.  Abdominal:     General: Abdomen is flat.  Musculoskeletal:     Cervical back: Normal range of motion and neck supple.     Comments: No obvious deformity of the left shoulder.  Patient is able to passively range the left shoulder.  However patient is unable to abduct the shoulder past 45 degrees or extend the shoulder forward.  No obvious extremity trauma   Skin:    General: Skin is warm.     Capillary Refill: Capillary refill takes less than 2 seconds.  Neurological:     General: No focal deficit present.     Mental Status: He is alert and oriented to person, place, and time.  Psychiatric:        Behavior: Behavior normal.    ED Results / Procedures / Treatments   Labs (all labs ordered are listed, but only abnormal results are displayed) Labs Reviewed  BASIC METABOLIC PANEL  CBC WITH DIFFERENTIAL/PLATELET  PROTIME-INR  APTT  TROPONIN I (HIGH SENSITIVITY)    EKG None  Radiology CT Head Wo Contrast  Result  Date: 12/24/2021 CLINICAL DATA:  Fall EXAM: CT HEAD WITHOUT CONTRAST TECHNIQUE: Contiguous axial images were obtained from the base of the skull through the vertex without intravenous contrast. COMPARISON:  None. FINDINGS: Brain: There is no acute intracranial hemorrhage, extra-axial fluid collection, or acute infarct. There is mild global parenchymal volume loss with prominence of the ventricular system and extra-axial CSF spaces. Patchy hypodensity in the subcortical and periventricular white matter likely reflects sequela of mild chronic white matter microangiopathy. There is no solid mass lesion.  There is no midline shift. Vascular: There is calcification of the bilateral cavernous ICAs. Skull: Normal. Negative for fracture or focal lesion. Sinuses/Orbits: There is mild mucosal thickening along the floor of the right maxillary sinus. Bilateral lens implants are in place. The globes and orbits are otherwise unremarkable. Other: None. IMPRESSION: 1. No acute intracranial hemorrhage or calvarial fracture. 2. Mild parenchymal volume loss and chronic white matter microangiopathy. Electronically Signed   By: Valetta Mole M.D.   On: 12/24/2021 14:59   DG Shoulder Left  Result Date: 12/24/2021 CLINICAL DATA:  Status post fall.  Pain EXAM: LEFT SHOULDER - 2+ VIEW COMPARISON:  None. FINDINGS: There is no evidence of fracture or dislocation. Mild degenerative changes noted at the [facility] The joint. Soft tissues are unremarkable. IMPRESSION: 1. No acute findings. 2. Mild AC joint osteoarthritis. Electronically Signed   By: Kerby Moors M.D.   On: 12/24/2021 15:21   DG Hip Unilat W or Wo Pelvis 2-3 Views Left  Result Date: 12/24/2021 CLINICAL DATA:  80 year old male with a history of fall EXAM: DG HIP (WITH OR WITHOUT PELVIS) 2-3V LEFT COMPARISON:  None. FINDINGS: There is no evidence of hip fracture or dislocation. Minimal degenerative changes. No radiopaque foreign body. IMPRESSION: Negative. Electronically Signed    By: Corrie Mckusick D.O.   On: 12/24/2021 15:25    Procedures Procedures   Medications Ordered in ED Medications - No data to display  ED Course  I have reviewed the triage vital signs and the  nursing notes.  Pertinent labs & imaging results that were available during my care of the patient were reviewed by me and considered in my medical decision making (see chart for details).    MDM Rules/Calculators/A&P                         Chad Washington is a 80 y.o. male here presenting with left shoulder injury.  Patient also may have a positive troponin yesterday.  Patient does not have any chest pain however.  We will get a repeat troponin and will get CT head given that he had head injury.  We will also repeat x-ray of the shoulder  9:50 PM CT head unremarkable.  Repeat troponin today is 15.  I do not think this is atypical presentation of MI.  He clinically has frozen shoulder.  Repeat x-ray did not show any fracture.  I recommend physical therapy and tramadol for pain.  Also recommend Ortho follow-up      Final Clinical Impression(s) / ED Diagnoses Final diagnoses:  None    Rx / DC Orders ED Discharge Orders          Ordered    traMADol (ULTRAM) 50 MG tablet  Every 6 hours PRN        12/24/21 2140             Drenda Freeze, MD 12/24/21 2150

## 2021-12-24 NOTE — Discharge Instructions (Signed)
You likely have a rotator cuff injury or frozen shoulder.  I do recommend starting physical therapy right away  You may take tramadol for pain  Please see orthopedic doctor for follow-up  Your heart enzyme test is normal today   Return to ER if you have worse shoulder pain, chest pain, shortness of breath

## 2021-12-24 NOTE — ED Triage Notes (Addendum)
Pt here via EMS with c/o left arm pain. Fall today. Denies thinners, no trauma noted. Endorses left arm pain. PCP recommended being seen by ED. Denies SOB, N,V,Fever. Alert and oriented X4 Pt states he is unable to raise arm up.  20g LAC

## 2021-12-28 DIAGNOSIS — M25512 Pain in left shoulder: Secondary | ICD-10-CM | POA: Diagnosis not present

## 2021-12-28 DIAGNOSIS — M6281 Muscle weakness (generalized): Secondary | ICD-10-CM | POA: Diagnosis not present

## 2022-01-07 ENCOUNTER — Other Ambulatory Visit: Payer: Self-pay

## 2022-01-07 ENCOUNTER — Ambulatory Visit: Payer: Medicare PPO | Admitting: Physician Assistant

## 2022-01-07 ENCOUNTER — Encounter: Payer: Self-pay | Admitting: Physician Assistant

## 2022-01-07 DIAGNOSIS — R29898 Other symptoms and signs involving the musculoskeletal system: Secondary | ICD-10-CM

## 2022-01-07 NOTE — Progress Notes (Signed)
Office Visit Note   Patient: Chad Washington           Date of Birth: Nov 28, 1941           MRN: [id number] Visit Date: 01/07/2022              Requested by: Chad Low, MD [address] [place],   [address] PCP: Chad Low, MD   Assessment & Plan: Visit Diagnoses:  1. Shoulder weakness     Plan: Given the fact he has shoulder weakness and decreased range of motion recommend MRI to evaluate for rotator cuff tear.  We will see him back after the MRI to go over results discuss further treatment.  Questions were encouraged and answered at length today.  Follow-Up Instructions: Return After MRI.   Orders:  No orders of the defined types were placed in this encounter.  No orders of the defined types were placed in this encounter.     Procedures: No procedures performed   Clinical Data: No additional findings.   Subjective: Chief Complaint  Patient presents with   Left Shoulder - Weakness    HPI Chad Washington is a pleasant 81 year old male were seen for the first time for left shoulder pain.  He was seen in the ER on 12/24/2021 secondary to a fall that occurred on 12/18/2021.  He he fell due to the fact he was weak from his stomach being upset on amoxicillin.  He had radiographs of his hip and his left shoulder.  His hip is doing fine.  He continues to have weakness in the left shoulder but has no pain.  He wants to make sure that everything is right with his shoulder today.  He just reports decreased range of motion and strength of the shoulder.  He does feel that he is improved with his strength with physical therapy. Radiographs left shoulder dated 12/24/2021 are reviewed.  Shows the shoulder to be well located.  No acute fractures.  Glenohumeral joints well-maintained.  Mild arthritic changes AC joint.  Review of Systems See HPI otherwise negative or noncontributory  Objective: Vital Signs: There were no vitals taken for this  visit.  Physical Exam General well-developed well-nourished pleasant male in no acute distress Psych: Alert and oriented x3 Ortho Exam Bilateral shoulders right shoulder full range of motion actively.  Left shoulder passively I can bring his arm fully overhead.  He has limited forward flexion actively to approximately 70 to 80 degrees.  Weakness with external rotation against resistance left shoulder only.  Internal rotation bilateral shoulders against resistance 5 out of 5.  Liftoff test is negative bilaterally.  Empty can test weakness on the left. Specialty Comments:  No specialty comments available.  Imaging: No results found.   PMFS History: Patient Active Problem List   Diagnosis Date Noted   Acute on chronic combined systolic and diastolic CHF (congestive heart failure) (HCC)    Left main coronary artery disease    Abnormal echocardiogram    Ischemic cardiomyopathy    Status post surgery 06/07/2021   Acute ST elevation myocardial infarction (STEMI) of anterior wall ([place] [address]) 06/07/2021   S/P CABG x 4 06/07/2021   Past Medical History:  Diagnosis Date   Asthma    allergy induced asthma   GERD (gastroesophageal reflux disease)     Family History  Problem Relation Age of Onset   Hypertension Mother    Cancer Father    Diabetes Father     Past  Surgical History:  Procedure Laterality Date   COLONOSCOPY WITH PROPOFOL N/A 06/21/2017   Procedure: COLONOSCOPY WITH PROPOFOL;  Surgeon: [name], MD;  [facility]: [facility];  Service: Endoscopy;  Laterality: N/A;   CORONARY ARTERY BYPASS GRAFT N/A 06/06/2021   Procedure: CORONARY ARTERY BYPASS GRAFTING (CABG) TIMES FOUR, ON PUMP, USING LEFT INTERNAL MAMMARY ARTERY AND ENDOSCOPICALLY HARVESTED RIGHT GREATER SAPHENOUS VEIN;  Surgeon: [name], MD;  Location: [place];  Service: Open Heart Surgery;  Laterality: N/A;   ENDOVEIN HARVEST OF GREATER SAPHENOUS VEIN Right 06/06/2021   Procedure: ENDOVEIN HARVEST OF GREATER  SAPHENOUS VEIN;  Surgeon: [name], MD;  Location: [facility];  Service: Open Heart Surgery;  Laterality: Right;   EYE SURGERY Bilateral 6 yrs ago   ioc lens implants   HERNIA REPAIR     inguinal hernia left 8- 10 yrs ago, right inguinal hernia age 40   IABP INSERTION N/A 06/06/2021   Procedure: IABP Insertion;  Surgeon: [name], MD;  Location: [place] [facility];  Service: Cardiovascular;  Laterality: N/A;   LEFT HEART CATH AND CORONARY ANGIOGRAPHY N/A 06/06/2021   Procedure: LEFT HEART CATH AND CORONARY ANGIOGRAPHY;  Surgeon: [name], MD;  Location: [place] [facility];  Service: Cardiovascular;  Laterality: N/A;   LEFT HEART CATH AND CORS/GRAFTS ANGIOGRAPHY N/A 06/12/2021   Procedure: LEFT HEART CATH AND CORS/GRAFTS ANGIOGRAPHY;  Surgeon: [name], MD;  Location: [place] [facility];  Service: Cardiovascular;  Laterality: N/A;   TEE WITHOUT CARDIOVERSION  06/06/2021   Procedure: TRANSESOPHAGEAL ECHOCARDIOGRAM (TEE);  Surgeon: [name], MD;  Location: [facility] OR;  Service: Open Heart Surgery;;   Social History   Occupational History   Not on file  Tobacco Use   Smoking status: Never   Smokeless tobacco: Never  Vaping Use   Vaping Use: Never used  Substance and Sexual Activity   Alcohol use: Yes    Alcohol/week: 0.0 standard drinks    Comment: occasionally   Drug use: No   Sexual activity: Yes    Partners: Male

## 2022-01-21 ENCOUNTER — Other Ambulatory Visit: Payer: Self-pay | Admitting: Physician Assistant

## 2022-01-21 DIAGNOSIS — G8929 Other chronic pain: Secondary | ICD-10-CM

## 2022-01-26 ENCOUNTER — Ambulatory Visit
Admission: RE | Admit: 2022-01-26 | Discharge: 2022-01-26 | Disposition: A | Discharge status: Home / Self Care | Payer: Medicare PPO | Source: Ambulatory Visit | Attending: Physician Assistant | Admitting: Physician Assistant

## 2022-01-26 DIAGNOSIS — G8929 Other chronic pain: Secondary | ICD-10-CM

## 2022-01-26 DIAGNOSIS — M19012 Primary osteoarthritis, left shoulder: Secondary | ICD-10-CM | POA: Diagnosis not present

## 2022-01-26 DIAGNOSIS — S46012A Strain of muscle(s) and tendon(s) of the rotator cuff of left shoulder, initial encounter: Secondary | ICD-10-CM | POA: Diagnosis not present

## 2022-02-02 DIAGNOSIS — M6281 Muscle weakness (generalized): Secondary | ICD-10-CM | POA: Diagnosis not present

## 2022-02-02 DIAGNOSIS — M25512 Pain in left shoulder: Secondary | ICD-10-CM | POA: Diagnosis not present

## 2022-02-04 DIAGNOSIS — M25512 Pain in left shoulder: Secondary | ICD-10-CM | POA: Diagnosis not present

## 2022-02-04 DIAGNOSIS — M6281 Muscle weakness (generalized): Secondary | ICD-10-CM | POA: Diagnosis not present

## 2022-02-09 ENCOUNTER — Ambulatory Visit (INDEPENDENT_AMBULATORY_CARE_PROVIDER_SITE_OTHER): Payer: Medicare PPO | Admitting: Orthopaedic Surgery

## 2022-02-09 ENCOUNTER — Other Ambulatory Visit: Payer: Self-pay

## 2022-02-09 ENCOUNTER — Encounter: Payer: Self-pay | Admitting: Orthopaedic Surgery

## 2022-02-09 DIAGNOSIS — R29898 Other symptoms and signs involving the musculoskeletal system: Secondary | ICD-10-CM

## 2022-02-09 DIAGNOSIS — M6281 Muscle weakness (generalized): Secondary | ICD-10-CM | POA: Diagnosis not present

## 2022-02-09 DIAGNOSIS — M25512 Pain in left shoulder: Secondary | ICD-10-CM | POA: Diagnosis not present

## 2022-02-09 NOTE — Progress Notes (Signed)
The patient comes in today to go over MRI of his left shoulder.  He is an active 81 year old gentleman.  He has been getting outpatient physical therapy for his left shoulder at [facility] where he lives.  He said that that has really made a difference and he is made progress each week when they measured him.  He is more satisfied with his ability to lift his left shoulder and perform activities daily living.  We sent him for an MRI due to the weakness so we can get a better baseline of what to expect as he continues to therapy on his left shoulder.  His abduction and external rotation and overhead forward flexion have improved quite a bit with his left shoulder.  The MRI shows only small partial thickness tearing of the rotator cuff with no retraction and no atrophy and these are only very small tears.  He denies any pain with his shoulder.  He is having a good outcome with physical therapy and he agrees with continuing this for another 4 to 6 weeks.  All question concerns were answered addressed.  I gave him a copy of the MRI report that shows therapist.  Follow-up can be as needed at this standpoint.

## 2022-02-11 DIAGNOSIS — M6281 Muscle weakness (generalized): Secondary | ICD-10-CM | POA: Diagnosis not present

## 2022-02-11 DIAGNOSIS — M25512 Pain in left shoulder: Secondary | ICD-10-CM | POA: Diagnosis not present

## 2022-02-17 DIAGNOSIS — M25512 Pain in left shoulder: Secondary | ICD-10-CM | POA: Diagnosis not present

## 2022-02-17 DIAGNOSIS — M6281 Muscle weakness (generalized): Secondary | ICD-10-CM | POA: Diagnosis not present

## 2022-02-18 DIAGNOSIS — M25512 Pain in left shoulder: Secondary | ICD-10-CM | POA: Diagnosis not present

## 2022-02-18 DIAGNOSIS — M6281 Muscle weakness (generalized): Secondary | ICD-10-CM | POA: Diagnosis not present

## 2022-02-23 DIAGNOSIS — M25512 Pain in left shoulder: Secondary | ICD-10-CM | POA: Diagnosis not present

## 2022-02-23 DIAGNOSIS — M6281 Muscle weakness (generalized): Secondary | ICD-10-CM | POA: Diagnosis not present

## 2022-02-25 DIAGNOSIS — M6281 Muscle weakness (generalized): Secondary | ICD-10-CM | POA: Diagnosis not present

## 2022-02-25 DIAGNOSIS — M25512 Pain in left shoulder: Secondary | ICD-10-CM | POA: Diagnosis not present

## 2022-03-02 DIAGNOSIS — M6281 Muscle weakness (generalized): Secondary | ICD-10-CM | POA: Diagnosis not present

## 2022-03-02 DIAGNOSIS — M25512 Pain in left shoulder: Secondary | ICD-10-CM | POA: Diagnosis not present

## 2022-03-04 DIAGNOSIS — M25512 Pain in left shoulder: Secondary | ICD-10-CM | POA: Diagnosis not present

## 2022-03-04 DIAGNOSIS — M6281 Muscle weakness (generalized): Secondary | ICD-10-CM | POA: Diagnosis not present

## 2022-03-10 DIAGNOSIS — M25512 Pain in left shoulder: Secondary | ICD-10-CM | POA: Diagnosis not present

## 2022-03-10 DIAGNOSIS — M6281 Muscle weakness (generalized): Secondary | ICD-10-CM | POA: Diagnosis not present

## 2022-03-11 DIAGNOSIS — M25512 Pain in left shoulder: Secondary | ICD-10-CM | POA: Diagnosis not present

## 2022-03-11 DIAGNOSIS — M6281 Muscle weakness (generalized): Secondary | ICD-10-CM | POA: Diagnosis not present

## 2022-03-16 DIAGNOSIS — M6281 Muscle weakness (generalized): Secondary | ICD-10-CM | POA: Diagnosis not present

## 2022-03-16 DIAGNOSIS — M25512 Pain in left shoulder: Secondary | ICD-10-CM | POA: Diagnosis not present

## 2022-03-19 DIAGNOSIS — M6281 Muscle weakness (generalized): Secondary | ICD-10-CM | POA: Diagnosis not present

## 2022-03-19 DIAGNOSIS — M25512 Pain in left shoulder: Secondary | ICD-10-CM | POA: Diagnosis not present

## 2022-03-23 DIAGNOSIS — M25512 Pain in left shoulder: Secondary | ICD-10-CM | POA: Diagnosis not present

## 2022-03-23 DIAGNOSIS — M6281 Muscle weakness (generalized): Secondary | ICD-10-CM | POA: Diagnosis not present

## 2022-03-25 DIAGNOSIS — M6281 Muscle weakness (generalized): Secondary | ICD-10-CM | POA: Diagnosis not present

## 2022-03-25 DIAGNOSIS — M25512 Pain in left shoulder: Secondary | ICD-10-CM | POA: Diagnosis not present

## 2022-03-30 DIAGNOSIS — M6281 Muscle weakness (generalized): Secondary | ICD-10-CM | POA: Diagnosis not present

## 2022-03-30 DIAGNOSIS — M25512 Pain in left shoulder: Secondary | ICD-10-CM | POA: Diagnosis not present

## 2022-04-19 DIAGNOSIS — Z961 Presence of intraocular lens: Secondary | ICD-10-CM | POA: Diagnosis not present

## 2022-04-22 DIAGNOSIS — E7800 Pure hypercholesterolemia, unspecified: Secondary | ICD-10-CM | POA: Diagnosis not present

## 2022-04-22 DIAGNOSIS — I25810 Atherosclerosis of coronary artery bypass graft(s) without angina pectoris: Secondary | ICD-10-CM | POA: Diagnosis not present

## 2022-04-22 DIAGNOSIS — I255 Ischemic cardiomyopathy: Secondary | ICD-10-CM | POA: Diagnosis not present

## 2022-04-22 DIAGNOSIS — K219 Gastro-esophageal reflux disease without esophagitis: Secondary | ICD-10-CM | POA: Diagnosis not present

## 2022-04-22 DIAGNOSIS — N529 Male erectile dysfunction, unspecified: Secondary | ICD-10-CM | POA: Diagnosis not present

## 2022-04-22 DIAGNOSIS — I509 Heart failure, unspecified: Secondary | ICD-10-CM | POA: Diagnosis not present

## 2022-04-22 DIAGNOSIS — J309 Allergic rhinitis, unspecified: Secondary | ICD-10-CM | POA: Diagnosis not present

## 2022-05-04 ENCOUNTER — Encounter: Payer: Self-pay | Admitting: Cardiovascular Disease

## 2022-05-17 ENCOUNTER — Encounter: Payer: Self-pay | Admitting: Cardiovascular Disease

## 2022-05-17 ENCOUNTER — Ambulatory Visit: Payer: Medicare PPO | Admitting: Cardiovascular Disease

## 2022-05-17 DIAGNOSIS — K219 Gastro-esophageal reflux disease without esophagitis: Secondary | ICD-10-CM

## 2022-05-17 DIAGNOSIS — Z951 Presence of aortocoronary bypass graft: Secondary | ICD-10-CM | POA: Diagnosis not present

## 2022-05-17 DIAGNOSIS — I2109 ST elevation (STEMI) myocardial infarction involving other coronary artery of anterior wall: Secondary | ICD-10-CM | POA: Diagnosis not present

## 2022-05-17 DIAGNOSIS — I255 Ischemic cardiomyopathy: Secondary | ICD-10-CM

## 2022-05-17 DIAGNOSIS — E785 Hyperlipidemia, unspecified: Secondary | ICD-10-CM

## 2022-05-17 NOTE — Progress Notes (Signed)
[facility] Office Note    Date:  05/24/2022   ID:  Chad Washington, DOB 1941-05-15, MRN [id number]  PCP:  [name], MD  Cardiologist:  [name], MD   Initial office visit with me following his June 2022 hospitalization.   History of Present Illness:  Chad Washington is a 81 y.o. male who is a retired Psychologist, sport and exercise at [employer] who has a history of GERD for which she was on [employer].  On June 06, 2021 he developed new onset chest/shoulder pain and called EMS who arrived at his house and his ECG was reportedly normal.  Later that evening at approximately 10:15 PM he again developed recurrent chest discomfort with arm and shoulder discomfort and presented to [facility] ER where ECG showed anterior ST segment elevation.  A code STEMI was activated.  I performed emergent cardiac catheterization and was found to have severe 95% distal left main stenosis, 95% ostial LAD stenosis, 70% stenosis in the first diagonal vessel, 95% ostial circumflex stenosis with 85% ramus stenosis and mild CAD.  Due to his critical life-threatening anatomy, I called Dr. Roxan Hockey for emergent surgical consultation and CABG revascularization.  I placed an intra-aortic balloon pump for hemodynamic stability and he was taken urgently from the catheterization laboratory for urgent surgery and had a LIMA placed to his LAD, saphenous vein graft to his diagonal and sequential saphenous vein graft to obtuse marginals 1 and 2.  On June 12, 2021 was taken back to the catheterization laboratory due to concerning EKG findings with reduced EF on echo and troponin elevation.  All grafts were widely patent.  Subsequently, he has been evaluated by Coletta Memos, NP in July and more recently October 2022 and has seen Dr. Roxan Hockey several occasions.  Due to increased heart rate, his beta-blocker dose was titrated.  An echo Doppler study on October 20, 2021 showed an EF of 35 to 40% with grade 1 diastolic dysfunction.  RV  systolic function was mildly reduced.  Valves were normal.  Presently, he denies any recurrent chest pain.  He is followed by Dr. Ninfa Linden for slight tear in his left rotator cuff.  He denies any palpitations.  He is now on metoprolol tartrate 100 mg twice a day furosemide 20 mg daily, and atorvastatin 40 mg.  He is on aspirin.  He takes Zyrtec for allergies and Dexilant and Singulair.  He presents for his initial office evaluation with me.   Past Medical History:  Diagnosis Date   Asthma    allergy induced asthma   GERD (gastroesophageal reflux disease)     Past Surgical History:  Procedure Laterality Date   COLONOSCOPY WITH PROPOFOL N/A 06/21/2017   Procedure: COLONOSCOPY WITH PROPOFOL;  Surgeon: [name], MD;  Location: [facility];  Service: Endoscopy;  Laterality: N/A;   CORONARY ARTERY BYPASS GRAFT N/A 06/06/2021   Procedure: CORONARY ARTERY BYPASS GRAFTING (CABG) TIMES FOUR, ON PUMP, USING LEFT INTERNAL MAMMARY ARTERY AND ENDOSCOPICALLY HARVESTED RIGHT GREATER SAPHENOUS VEIN;  Surgeon: [name], MD;  Location: [facility];  Service: Open Heart Surgery;  Laterality: N/A;   ENDOVEIN HARVEST OF GREATER SAPHENOUS VEIN Right 06/06/2021   Procedure: ENDOVEIN HARVEST OF GREATER SAPHENOUS VEIN;  Surgeon: [name], MD;  Location: [place];  Service: Open Heart Surgery;  Laterality: Right;   EYE SURGERY Bilateral 6 yrs ago   ioc lens implants   HERNIA REPAIR     inguinal hernia left 8- 10 yrs ago, right inguinal hernia  age 55   IABP INSERTION N/A 06/06/2021   Procedure: IABP Insertion;  Surgeon: [name], MD;  Location: [place] [facility];  Service: Cardiovascular;  Laterality: N/A;   LEFT HEART CATH AND CORONARY ANGIOGRAPHY N/A 06/06/2021   Procedure: LEFT HEART CATH AND CORONARY ANGIOGRAPHY;  Surgeon: [name], MD;  Location: [place] [facility];  Service: Cardiovascular;  Laterality: N/A;   LEFT HEART CATH AND CORS/GRAFTS ANGIOGRAPHY N/A 06/12/2021    Procedure: LEFT HEART CATH AND CORS/GRAFTS ANGIOGRAPHY;  Surgeon: [name], MD;  Location: [place] [facility];  Service: Cardiovascular;  Laterality: N/A;   TEE WITHOUT CARDIOVERSION  06/06/2021   Procedure: TRANSESOPHAGEAL ECHOCARDIOGRAM (TEE);  Surgeon: [name], MD;  Location: [facility] OR;  Service: Open Heart Surgery;;    Current Medications: Outpatient Medications Prior to Visit  Medication Sig Dispense Refill   albuterol (VENTOLIN HFA) 108 (90 Base) MCG/ACT inhaler 1-2 puffs as needed     aspirin EC 325 MG EC tablet Take 1 tablet (325 mg total) by mouth daily. 30 tablet 0   atorvastatin (LIPITOR) 40 MG tablet Take 1 tablet (40 mg total) by mouth daily. 30 tablet 3   DEXILANT 60 MG capsule Take by mouth.     EPINEPHrine 0.3 mg/0.3 mL IJ SOAJ injection Inject 0.3 mg into the muscle once as needed for anaphylaxis.     fluticasone (FLOVENT HFA) 110 MCG/ACT inhaler Inhale 1 puff into the lungs 2 (two) times daily.     furosemide (LASIX) 20 MG tablet Take 1 tablet (20 mg total) by mouth daily. 30 tablet 1   Meclizine HCl 25 MG CHEW 1 tablet as needed     metoprolol tartrate (LOPRESSOR) 100 MG tablet Take 100 mg by mouth 2 (two) times daily.     montelukast (SINGULAIR) 10 MG tablet Take 10 mg by mouth at bedtime.     Multiple Vitamins-Minerals (CENTRUM SILVER PO) Take 1 tablet by mouth daily.     omeprazole (PRILOSEC) 20 MG capsule Take 20 mg by mouth daily.     potassium chloride (KLOR-CON) 10 MEQ tablet Take 1 tablet (10 mEq total) by mouth daily. 30 tablet 1   metoprolol tartrate (LOPRESSOR) 100 MG tablet Take 1.5 tablets (150 mg total) by mouth 2 (two) times daily. (Patient taking differently: Take 100 mg by mouth 2 (two) times daily.) 120 tablet 3   azelastine (OPTIVAR) 0.05 % ophthalmic solution Place 1 drop into both eyes 2 (two) times daily. (Patient not taking: Reported on 05/17/2022)     cetirizine (ZYRTEC) 10 MG tablet Take 10 mg by mouth daily. (Patient not taking:  Reported on 05/17/2022)     traMADol (ULTRAM) 50 MG tablet Take 1 tablet (50 mg total) by mouth every 6 (six) hours as needed. (Patient not taking: Reported on 05/17/2022) 12 tablet 0   No facility-administered medications prior to visit.     Allergies:   Augmentin [amoxicillin-pot clavulanate] and Latex   Social History   Socioeconomic History   Marital status: Single    Spouse name: Not on file   Number of children: Not on file   Years of education: Not on file   Highest education level: Not on file  Occupational History   Not on file  Tobacco Use   Smoking status: Never   Smokeless tobacco: Never  Vaping Use   Vaping Use: Never used  Substance and Sexual Activity   Alcohol use: Yes    Alcohol/week: 0.0 standard drinks  Comment: occasionally   Drug use: No   Sexual activity: Yes    Partners: Male  Other Topics Concern   Not on file  Social History Narrative   Not on file   Social Determinants of Health   Financial Resource Strain: Not on file  Food Insecurity: Not on file  Transportation Needs: Not on file  Physical Activity: Not on file  Stress: Not on file  Social Connections: Not on file     Family History:  The patient's family history includes Cancer in his father; Diabetes in his father; Hypertension in his mother.   ROS General: Negative; No fevers, chills, or night sweats;  HEENT: Negative; No changes in vision or hearing, sinus congestion, difficulty swallowing Pulmonary: Negative; No cough, wheezing, shortness of breath, hemoptysis Cardiovascular: Negative; No chest pain, presyncope, syncope, palpitations GI: Negative; No nausea, vomiting, diarrhea, or abdominal pain GU: Negative; No dysuria, hematuria, or difficulty voiding Musculoskeletal: Negative; no myalgias, joint pain, or weakness Hematologic/Oncology: Negative; no easy bruising, bleeding Endocrine: Negative; no heat/cold intolerance; no diabetes Neuro: Negative; no changes in balance,  headaches Skin: Negative; No rashes or skin lesions Psychiatric: Negative; No behavioral problems, depression Sleep: Negative; No snoring, daytime sleepiness, hypersomnolence, bruxism, restless legs, hypnogognic hallucinations, no cataplexy Other comprehensive 14 point system review is negative.   PHYSICAL EXAM:   VS:  BP 107/68   Pulse 68   Ht 6' (1.829 m)   Wt 171 lb 9.6 oz (77.8 kg)   SpO2 96%   BMI 23.27 kg/m     Repeat blood pressure by me was 106/72  Wt Readings from Last 3 Encounters:  05/17/22 171 lb 9.6 oz (77.8 kg)  10/05/21 171 lb 9.6 oz (77.8 kg)  08/04/21 164 lb (74.4 kg)    General: Alert, oriented, no distress.  Skin: normal turgor, no rashes, warm and dry HEENT: Normocephalic, atraumatic. Pupils equal round and reactive to light; sclera anicteric; extraocular muscles intact;  Nose without nasal septal hypertrophy Mouth/Parynx benign; Mallinpatti scale 3 Neck: No JVD, no carotid bruits; normal carotid upstroke Lungs: clear to ausculatation and percussion; no wheezing or rales Chest wall: without tenderness to palpitation Heart: PMI not displaced, RRR, s1 s2 normal, 1/6 systolic murmur, no diastolic murmur, no rubs, gallops, thrills, or heaves Abdomen: soft, nontender; no hepatosplenomehaly, BS+; abdominal aorta nontender and not dilated by palpation. Back: no CVA tenderness Pulses 2+ Musculoskeletal: full range of motion, normal strength, no joint deformities Extremities: no clubbing cyanosis or edema, Homan's sign negative  Neurologic: grossly nonfocal; Cranial nerves grossly wnl Psychologic: Normal mood and affect   Studies/Labs Reviewed:   ECG (independently read by me): NSR at 68, QS v1-2; 0.5 mm J point elevation inferiorly; no ectopy  Recent Labs:    Latest Ref Rng & Units 12/24/2021    2:46 PM 06/15/2021    2:32 AM 06/14/2021    1:37 AM  BMP  Glucose 70 - 99 mg/dL 91   106   113    BUN 8 - 23 mg/dL _0 Creatinine 0.61 - 1.24 mg/dL  0.92   0.99   0.91    Sodium 135 - 145 mmol/L 137   136   138    Potassium 3.5 - 5.1 mmol/L 3.8   3.7   3.9    Chloride 98 - 111 mmol/L 101   101   102    CO2 22 - 32 mmol/L 29   26  27    Calcium 8.9 - 10.3 mg/dL 8.9   8.8   8.7          Latest Ref Rng & Units 06/06/2021   11:55 PM 06/06/2021   10:52 PM  Hepatic Function  Total Protein 6.5 - 8.1 g/dL 6.3   6.6    Albumin 3.5 - 5.0 g/dL 3.5   4.0    AST 15 - 41 U/L 38   39    ALT 0 - 44 U/L 29   32    Alk Phosphatase 38 - 126 U/L 39   39    Total Bilirubin 0.3 - 1.2 mg/dL 0.7   0.7         Latest Ref Rng & Units 12/24/2021    2:46 PM 06/10/2021   12:54 AM 06/09/2021    5:07 AM  CBC  WBC 4.0 - 10.5 K/uL 8.6   14.2   12.7    Hemoglobin 13.0 - 17.0 g/dL 14.6   12.5   11.2    Hematocrit 39.0 - 52.0 % 43.9   36.0   33.6    Platelets 150 - 400 K/uL 302   129   101     Lab Results  Component Value Date   MCV 95.2 12/24/2021   MCV 93.5 06/10/2021   MCV 97.1 06/09/2021   No results found for: TSH Lab Results  Component Value Date   HGBA1C 5.0 06/08/2021     BNP    Component Value Date/Time   BNP 764.2 (H) 06/14/2021 0137    ProBNP No results found for: PROBNP   Lipid Panel     Component Value Date/Time   CHOL 168 06/06/2021 2355   TRIG 46 06/06/2021 2355   HDL 37 (L) 06/06/2021 2355   CHOLHDL 4.5 06/06/2021 2355   VLDL 9 06/06/2021 2355   LDLCALC 122 (H) 06/06/2021 2355     RADIOLOGY: No results found.   Additional studies/ records that were reviewed today include:   Ramus lesion is 85% stenosed. Dist LM to Ost LAD lesion is 95% stenosed. Ost Cx lesion is 95% stenosed. Prox LAD lesion is 95% stenosed. 1st Diag lesion is 70% stenosed. Prox RCA lesion is 30% stenosed. RPDA lesion is 50% stenosed.   Severe 95% distal left main stenosis percent proximal LAD stenosis, 70% diffuse first diagonal stenosis, 95% ostial left circumflex stenosis.  The RCA has mild luminal irregularity with narrowings of 20  and 30%.  There is a 50% ostial narrowing and a small PDA vessel.   Acute LV dysfunction with EF estimate approximately 50% with mild distal anterior lateral/apical hypocontractility.  LVEDP 23 mmHg.   Insertion of intra-aortic balloon pump for hemodynamic support prior to emergent CABG surgery this evening.   RECOMMENDATION: The patient will be taken to the operating room by Dr. Modesto Charon emergently for critical left main stenosis with subtotal LAD and ostial circumflex stenoses.       ECHO: 10/20/2021  Moderate global hypokinesis , apex akinetic. Left ventricular ejection fraction, by estimation, is 35 to 40%. The left ventricle has moderately decreased function. Left ventricular diastolic parameters are consistent with Grade I diastolic dysfunction (impaired relaxation).  Right ventricular systolic function is mildly reduced. The right ventricular size is normal. There is normal pulmonary artery systolic pressure.  The mitral valve is normal in structure. No evidence of mitral valve regurgitation. The aortic valve is normal in structure. Aortic valve regurgitation is not visualized. No aortic stenosis is present.  The inferior vena cava is normal in size with greater than 50% respiratory variability, suggesting right atrial pressure of 3 mmHg.  Comparison(s): 06/11/21 EF 35-40%. Conclusion(s)/Recommendation(s): No significant changes echo 06/11/2021.  ASSESSMENT:    1. Acute ST elevation myocardial infarction (STEMI) of anterior wall [facility]): June 06, 2021   2. S/P CABG x 4   3. Ischemic cardiomyopathy   4. Hyperlipidemia with target LDL less than 70   5. 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  He was found to have critical  life-threatening anatomy on emergent cardiac catheterization performed by me and intra-aortic balloon pump was inserted and he underwent emergent CABG revascularization surgery by Dr. Roxan Hockey with placement of a LIMA graft to his LAD, saphenous vein graft to his diagonal, and sequential saphenous vein graft to his OM1 and OM 2 vessels.  His RCA had mild disease and was not bypassed.  A repeat catheterization was done 6 days later when there were concerns of ECG changes and all grafts were patent.  He has a resultant ischemic cardiomyopathy with his most recent ejection fraction at 35 to 40% on echo October 20, 2021.  I have not seen him in the office since his emergent cardiac catheterization procedure apparently he required increasing doses of beta-blocker therapy due to increased heart rate.  Presently, he feels well and is without recurrent anginal symptomatology, shortness of breath, palpitations, or leg edema.  His blood pressure today is low and on repeat by me was 106/72.  He continues to be on metoprolol tartrate 100 mg twice a day in addition to furosemide 20 mg daily.  He is not on any ACE inhibition/ARB or Entresto.  I am recommending we obtain a follow-up echo Doppler study for reassessment of his LV function.  I am also checking laboratory with a comprehensive metabolic panel, CBC, TSH, fasting lipid and will also check an LP(a).  Depending upon laboratory, LV function, and blood pressure, he may be a candidate for alteration of his medical regimen to include guideline directed medical therapy.  Currently he is on aspirin.  He is on atorvastatin 40 mg for hyperlipidemia with target LDL less than 70.  I will see him in follow-up of the above studies and further recommendations will be made at that time.   Medication Adjustments/Labs and Tests Ordered: Current medicines are reviewed at length with the patient today.  Concerns regarding medicines are outlined above.  Medication changes, Labs and  Tests ordered today are listed in the Patient Instructions below. Patient Instructions  Medication Instructions:  Your physician recommends that you continue on your current medications as directed. Please refer to the Current Medication list given to you today.  *If you need a refill on your cardiac medications before your next appointment, please call your pharmacy*   Testing/Procedures: Your physician has requested that you have an echocardiogram. Echocardiography is a painless test that uses sound waves to create images of your heart. It provides your doctor with information about the size and shape of your heart and how well your heart's chambers and valves are working. This procedure takes approximately one hour. There are no restrictions for this procedure. -- complete before end of August    Follow-Up: At [facility], you and your health needs are our  priority.  As part of our continuing mission to provide you with exceptional heart care, we have created designated Provider Care Teams.  These Care Teams include your primary Cardiologist (physician) and Advanced Practice Providers (APPs -  Physician Assistants and Nurse Practitioners) who all work together to provide you with the care you need, when you need it.  We recommend signing up for the patient portal called "MyChart".  Sign up information is provided on this After Visit Summary.  MyChart is used to connect with patients for Virtual Visits (Telemedicine).  Patients are able to view lab/test results, encounter notes, upcoming appointments, etc.  Non-urgent messages can be sent to your provider as well.   To learn more about what you can do with MyChart, go to [email or web address].    Your next appointment:   3-4 months with Dr. Claiborne Billings -- after echo   Signed, [name], MD  05/24/2022 3:36 PM    [facility] [address] [place], [place], [place]  [address] Phone: [phone]

## 2022-05-17 NOTE — Patient Instructions (Signed)
Medication Instructions:  Your physician recommends that you continue on your current medications as directed. Please refer to the Current Medication list given to you today.  *If you need a refill on your cardiac medications before your next appointment, please call your pharmacy*   Testing/Procedures: Your physician has requested that you have an echocardiogram. Echocardiography is a painless test that uses sound waves to create images of your heart. It provides your doctor with information about the size and shape of your heart and how well your heart's chambers and valves are working. This procedure takes approximately one hour. There are no restrictions for this procedure. -- complete before end of August    Follow-Up: At [facility], you and your health needs are our priority.  As part of our continuing mission to provide you with exceptional heart care, we have created designated Provider Care Teams.  These Care Teams include your primary Cardiologist (physician) and Advanced Practice Providers (APPs -  Physician Assistants and Nurse Practitioners) who all work together to provide you with the care you need, when you need it.  We recommend signing up for the patient portal called "MyChart".  Sign up information is provided on this After Visit Summary.  MyChart is used to connect with patients for Virtual Visits (Telemedicine).  Patients are able to view lab/test results, encounter notes, upcoming appointments, etc.  Non-urgent messages can be sent to your provider as well.   To learn more about what you can do with MyChart, go to [email or web address].    Your next appointment:   3-4 months with Dr. Claiborne Billings -- after echo

## 2022-05-20 NOTE — Telephone Encounter (Signed)
Left message to call back. Discussed with Dr Claiborne Billings, ok for pt, make sure he does not take any form of nitroglycerin (not on medication list in chart). No chest pain. Make sure to notify pt if he has chest pain and goes to the ER he will need to tell them the last time that he took the Mescal . If no to all the above, ok to rx.

## 2022-05-24 ENCOUNTER — Encounter: Payer: Self-pay | Admitting: Cardiovascular Disease

## 2022-05-25 MED ORDER — TADALAFIL 20 MG PO TABS: 20.0000 mg | ORAL_TABLET | Freq: Every day | ORAL | 0 refills | Status: DC | PRN

## 2022-05-31 DIAGNOSIS — I25810 Atherosclerosis of coronary artery bypass graft(s) without angina pectoris: Secondary | ICD-10-CM | POA: Diagnosis not present

## 2022-05-31 DIAGNOSIS — M5090 Cervical disc disorder, unspecified, unspecified cervical region: Secondary | ICD-10-CM | POA: Diagnosis not present

## 2022-05-31 DIAGNOSIS — M549 Dorsalgia, unspecified: Secondary | ICD-10-CM | POA: Diagnosis not present

## 2022-06-07 DIAGNOSIS — M542 Cervicalgia: Secondary | ICD-10-CM | POA: Diagnosis not present

## 2022-06-07 DIAGNOSIS — M5090 Cervical disc disorder, unspecified, unspecified cervical region: Secondary | ICD-10-CM | POA: Diagnosis not present

## 2022-06-09 DIAGNOSIS — M5090 Cervical disc disorder, unspecified, unspecified cervical region: Secondary | ICD-10-CM | POA: Diagnosis not present

## 2022-06-09 DIAGNOSIS — M542 Cervicalgia: Secondary | ICD-10-CM | POA: Diagnosis not present

## 2022-06-14 DIAGNOSIS — M542 Cervicalgia: Secondary | ICD-10-CM | POA: Diagnosis not present

## 2022-06-14 DIAGNOSIS — M5090 Cervical disc disorder, unspecified, unspecified cervical region: Secondary | ICD-10-CM | POA: Diagnosis not present

## 2022-06-16 DIAGNOSIS — M542 Cervicalgia: Secondary | ICD-10-CM | POA: Diagnosis not present

## 2022-06-16 DIAGNOSIS — M5090 Cervical disc disorder, unspecified, unspecified cervical region: Secondary | ICD-10-CM | POA: Diagnosis not present

## 2022-06-18 DIAGNOSIS — M542 Cervicalgia: Secondary | ICD-10-CM | POA: Diagnosis not present

## 2022-06-18 DIAGNOSIS — M5090 Cervical disc disorder, unspecified, unspecified cervical region: Secondary | ICD-10-CM | POA: Diagnosis not present

## 2022-06-21 DIAGNOSIS — M5090 Cervical disc disorder, unspecified, unspecified cervical region: Secondary | ICD-10-CM | POA: Diagnosis not present

## 2022-06-21 DIAGNOSIS — M542 Cervicalgia: Secondary | ICD-10-CM | POA: Diagnosis not present

## 2022-06-30 DIAGNOSIS — M542 Cervicalgia: Secondary | ICD-10-CM | POA: Diagnosis not present

## 2022-06-30 DIAGNOSIS — M5090 Cervical disc disorder, unspecified, unspecified cervical region: Secondary | ICD-10-CM | POA: Diagnosis not present

## 2022-06-30 DIAGNOSIS — M545 Low back pain, unspecified: Secondary | ICD-10-CM | POA: Diagnosis not present

## 2022-07-02 DIAGNOSIS — M5090 Cervical disc disorder, unspecified, unspecified cervical region: Secondary | ICD-10-CM | POA: Diagnosis not present

## 2022-07-02 DIAGNOSIS — M542 Cervicalgia: Secondary | ICD-10-CM | POA: Diagnosis not present

## 2022-07-02 DIAGNOSIS — M545 Low back pain, unspecified: Secondary | ICD-10-CM | POA: Diagnosis not present

## 2022-07-02 IMAGING — DX DG SHOULDER 2+V*L*
3 series · 3 of 3 positions shown · non-contrast
Comparison: None.

CLINICAL DATA: Status post fall.  Pain

EXAM:
LEFT SHOULDER - 2+ VIEW

[shoulder grashey]
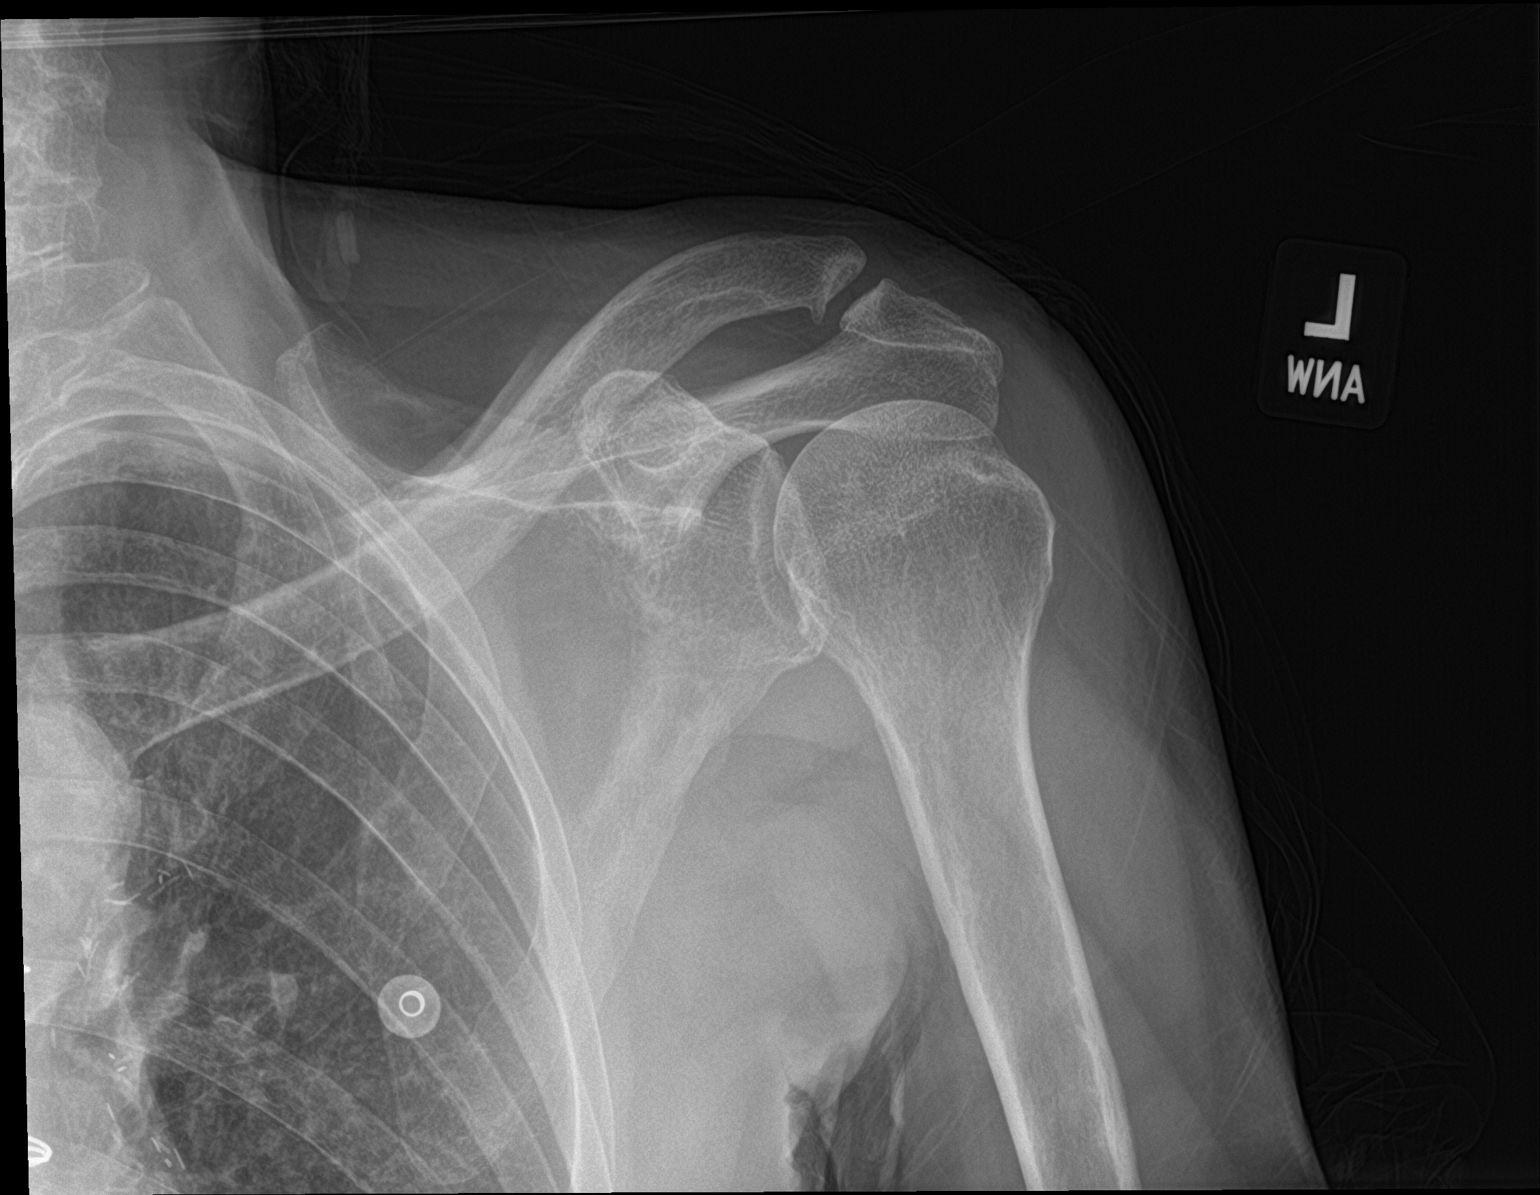

[shoulder axillary]
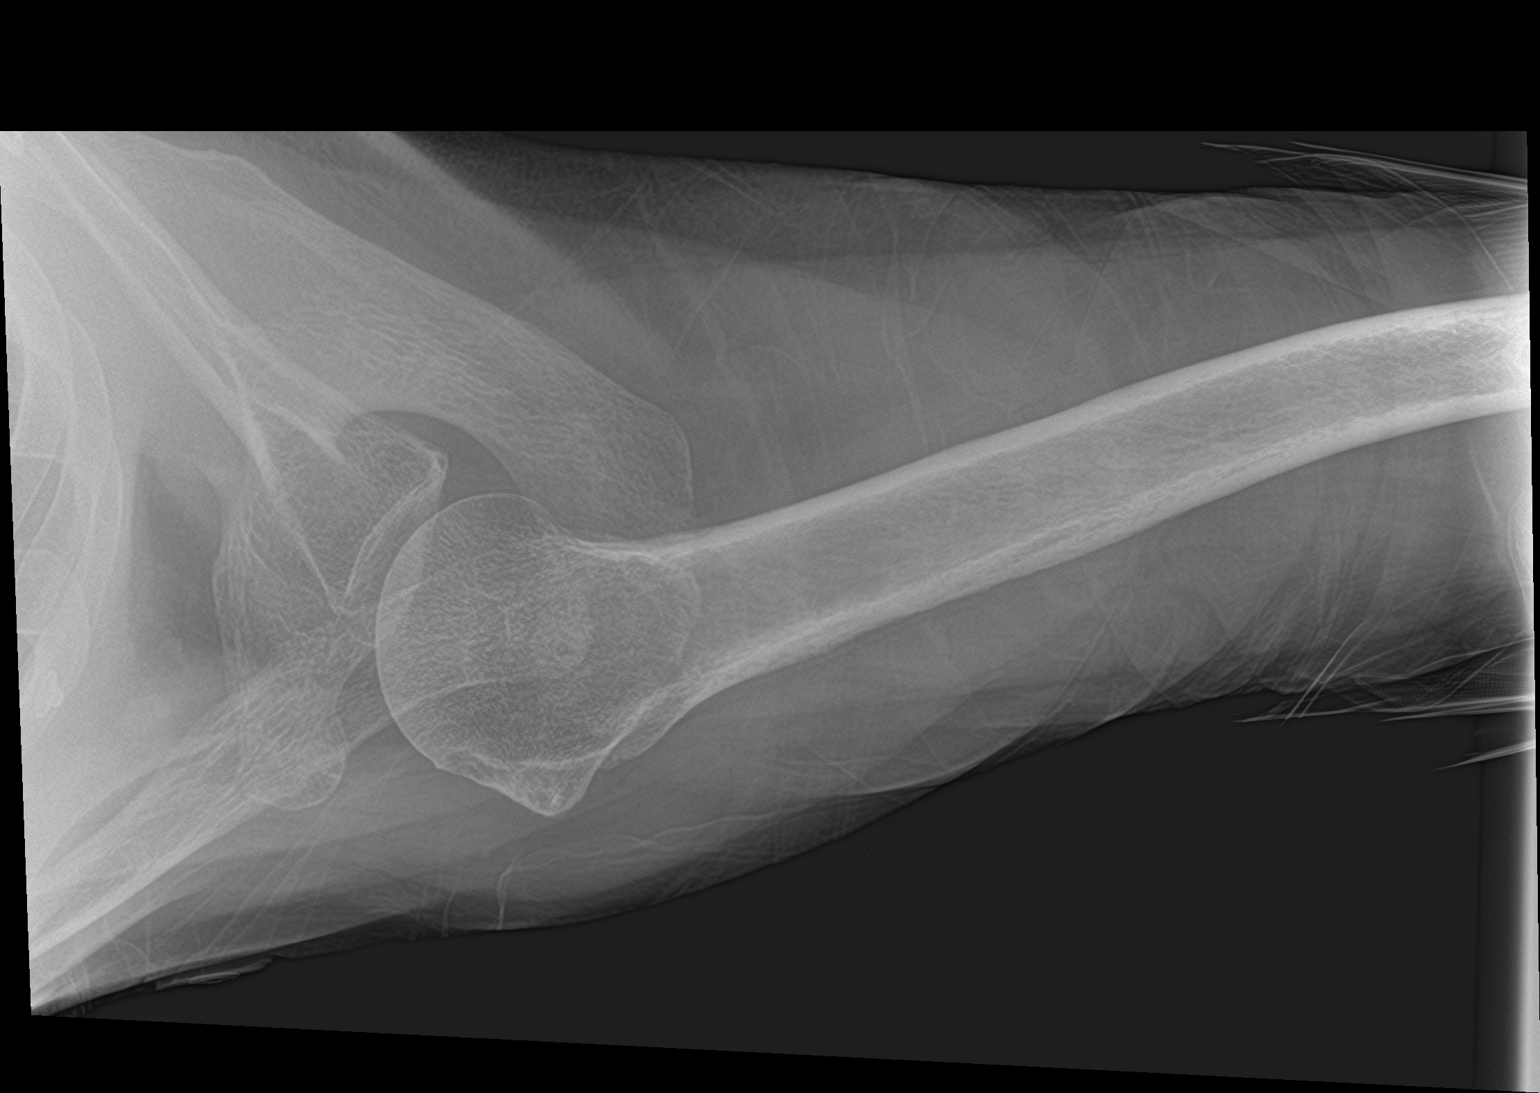

[shoulder y view]
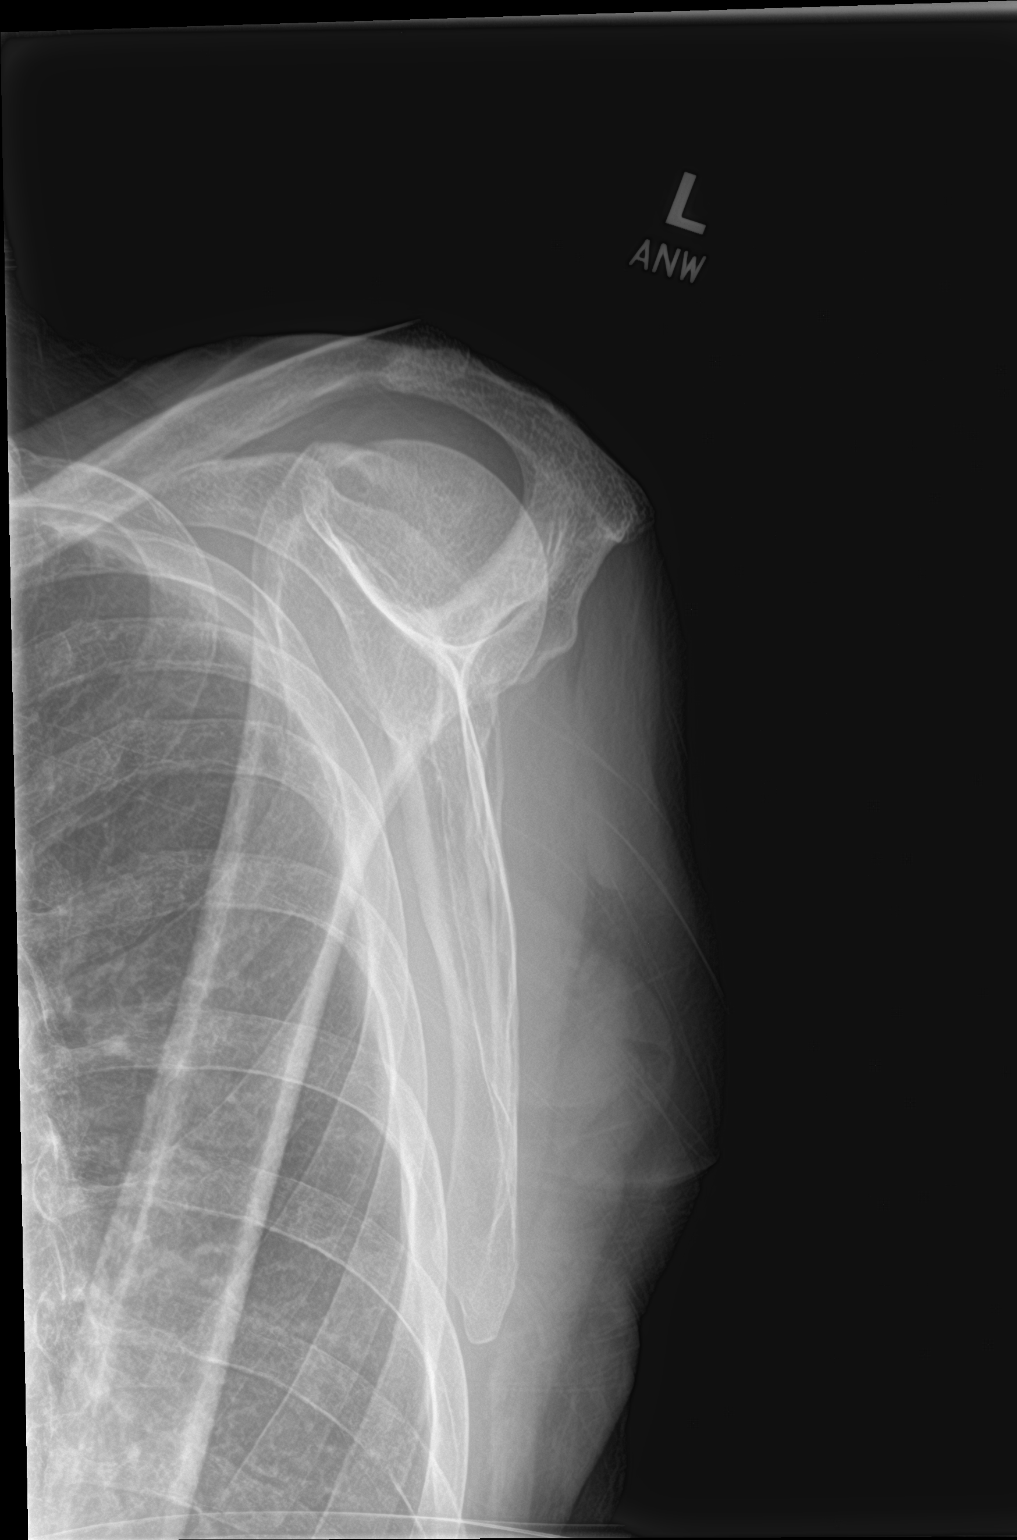

[3 of 3 positions shown; findings below may reference images not displayed]

FINDINGS: There is no evidence of fracture or dislocation. Mild degenerative
changes noted at the AC joint. Soft tissues are unremarkable.
IMPRESSION: 1. No acute findings.
2. Mild AC joint osteoarthritis.

## 2022-07-02 IMAGING — DX DG HIP (WITH OR WITHOUT PELVIS) 2-3V*L*
3 series · 3 of 3 positions shown · non-contrast
Comparison: None.

CLINICAL DATA: 80-year-old male with a history of fall

EXAM:
DG HIP (WITH OR WITHOUT PELVIS) 2-3V LEFT

[pelvis ap]
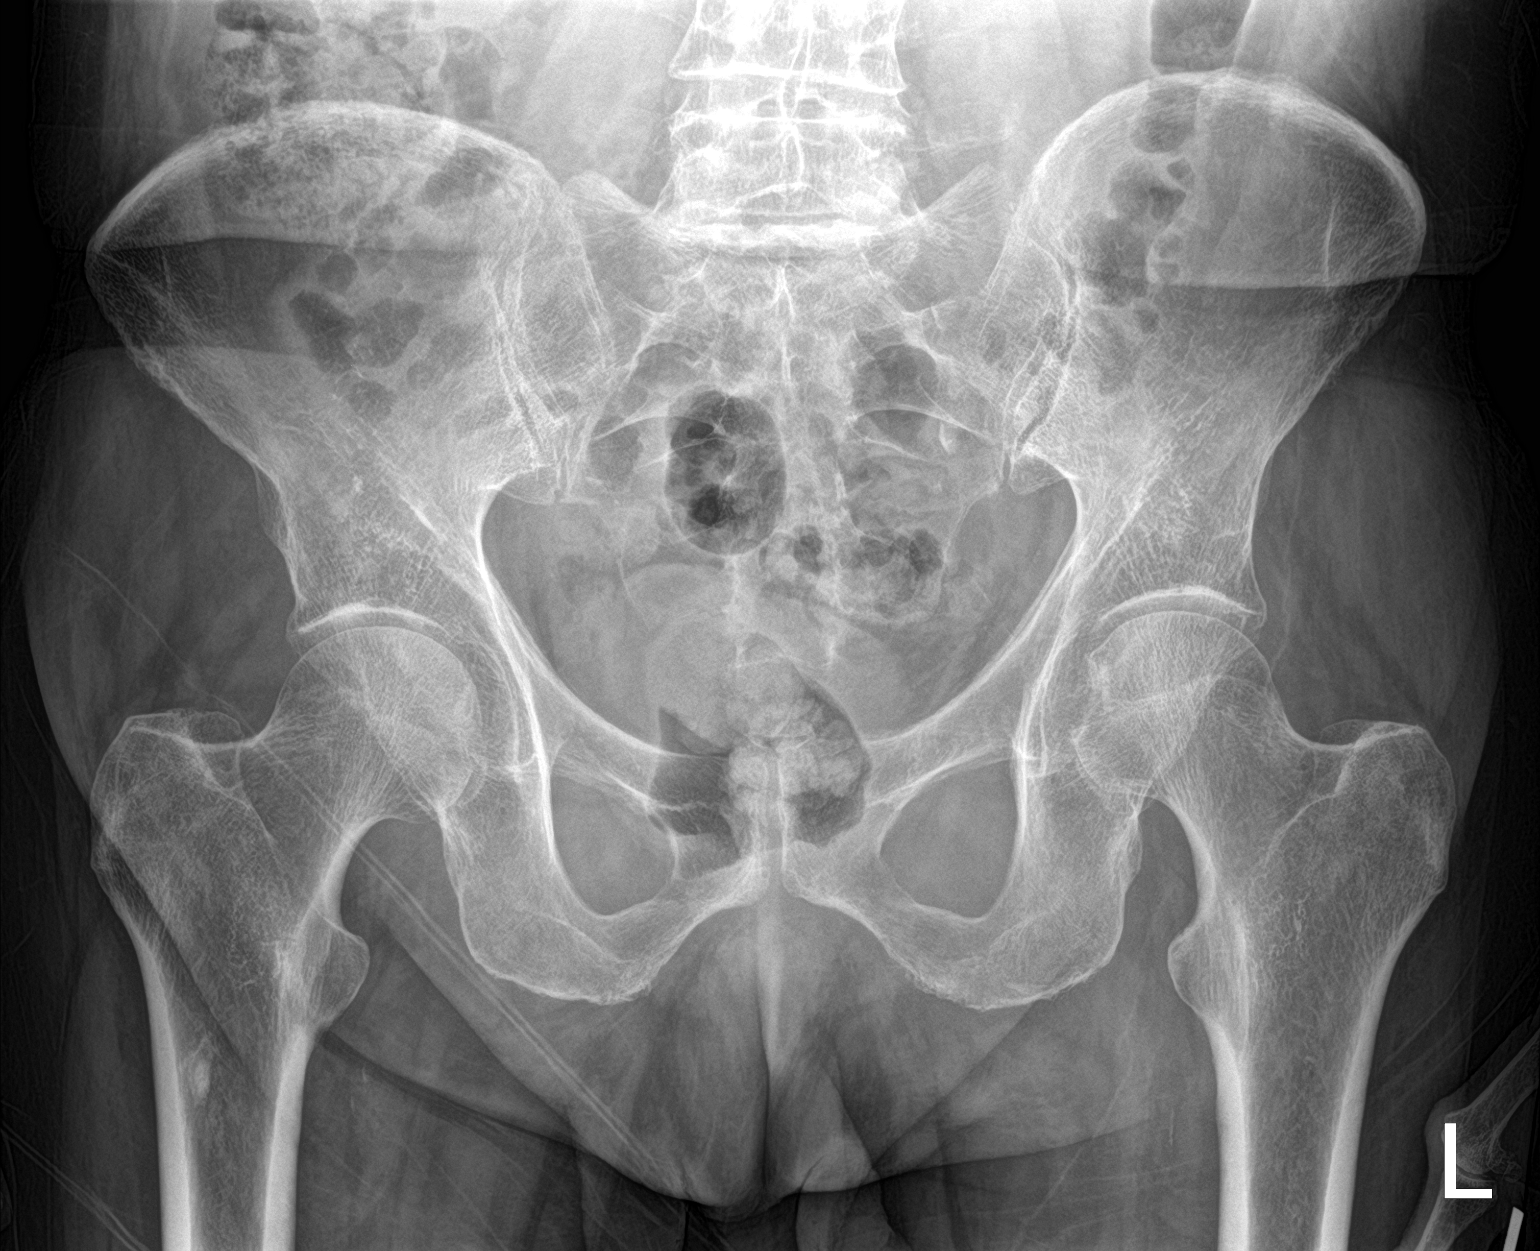

[hip ap]
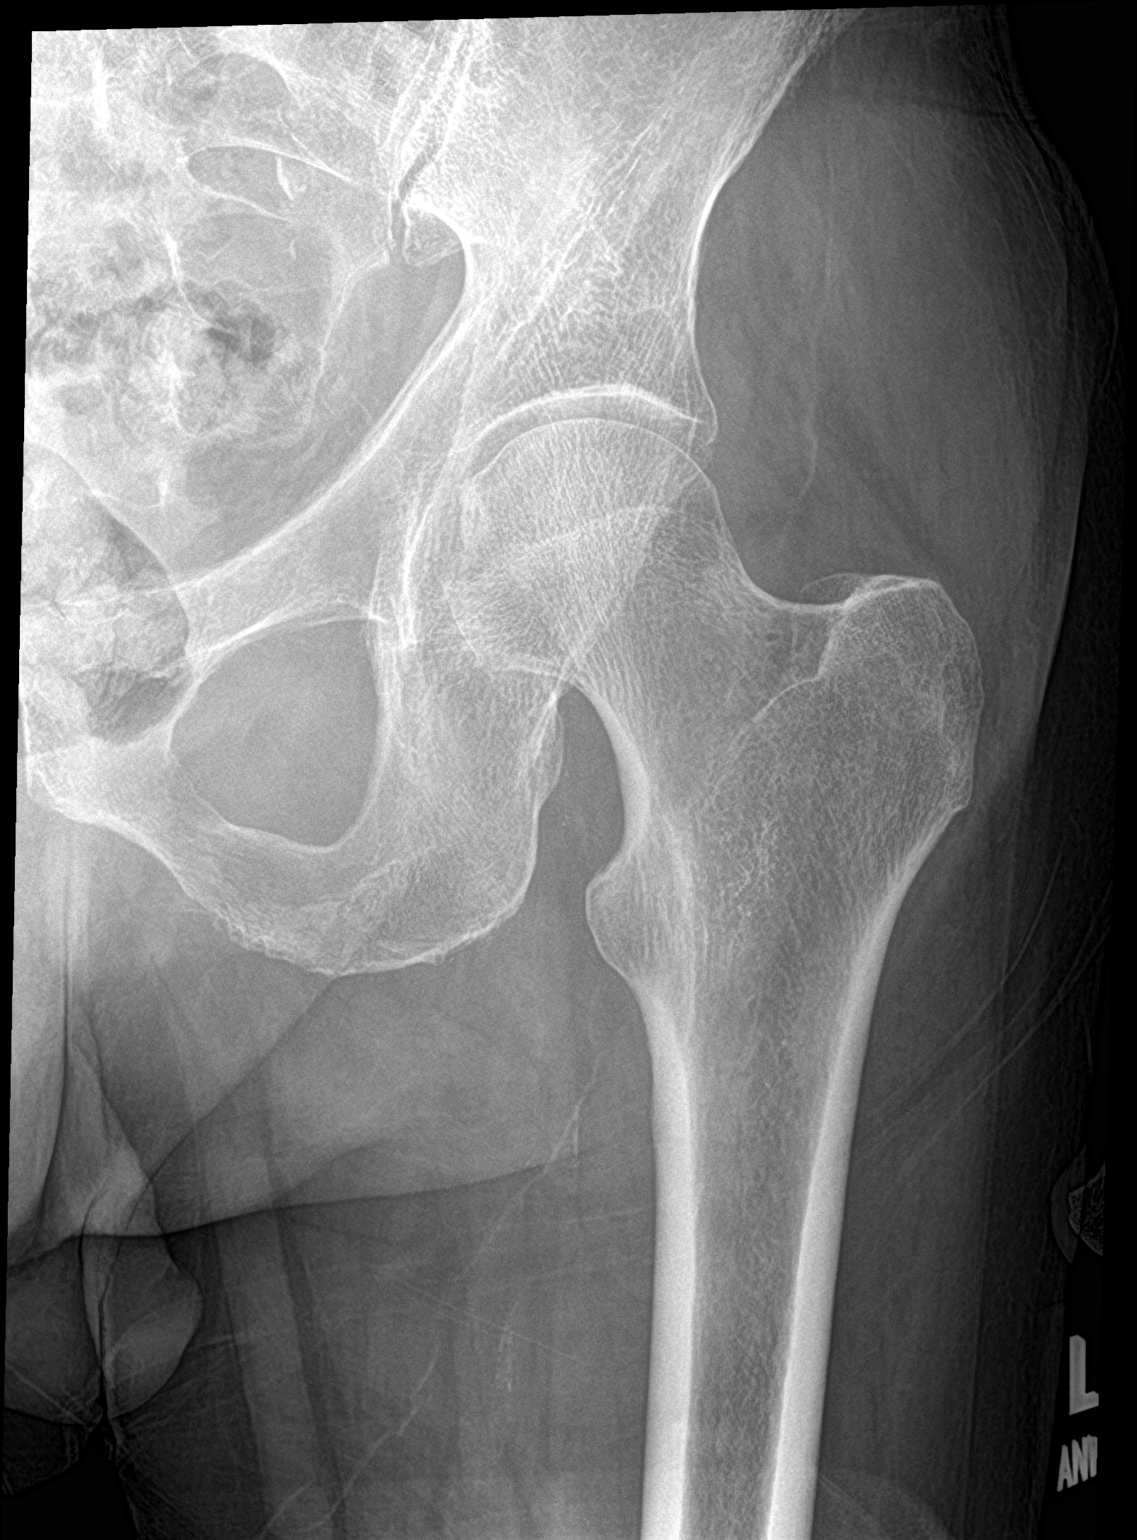

[hip lat]
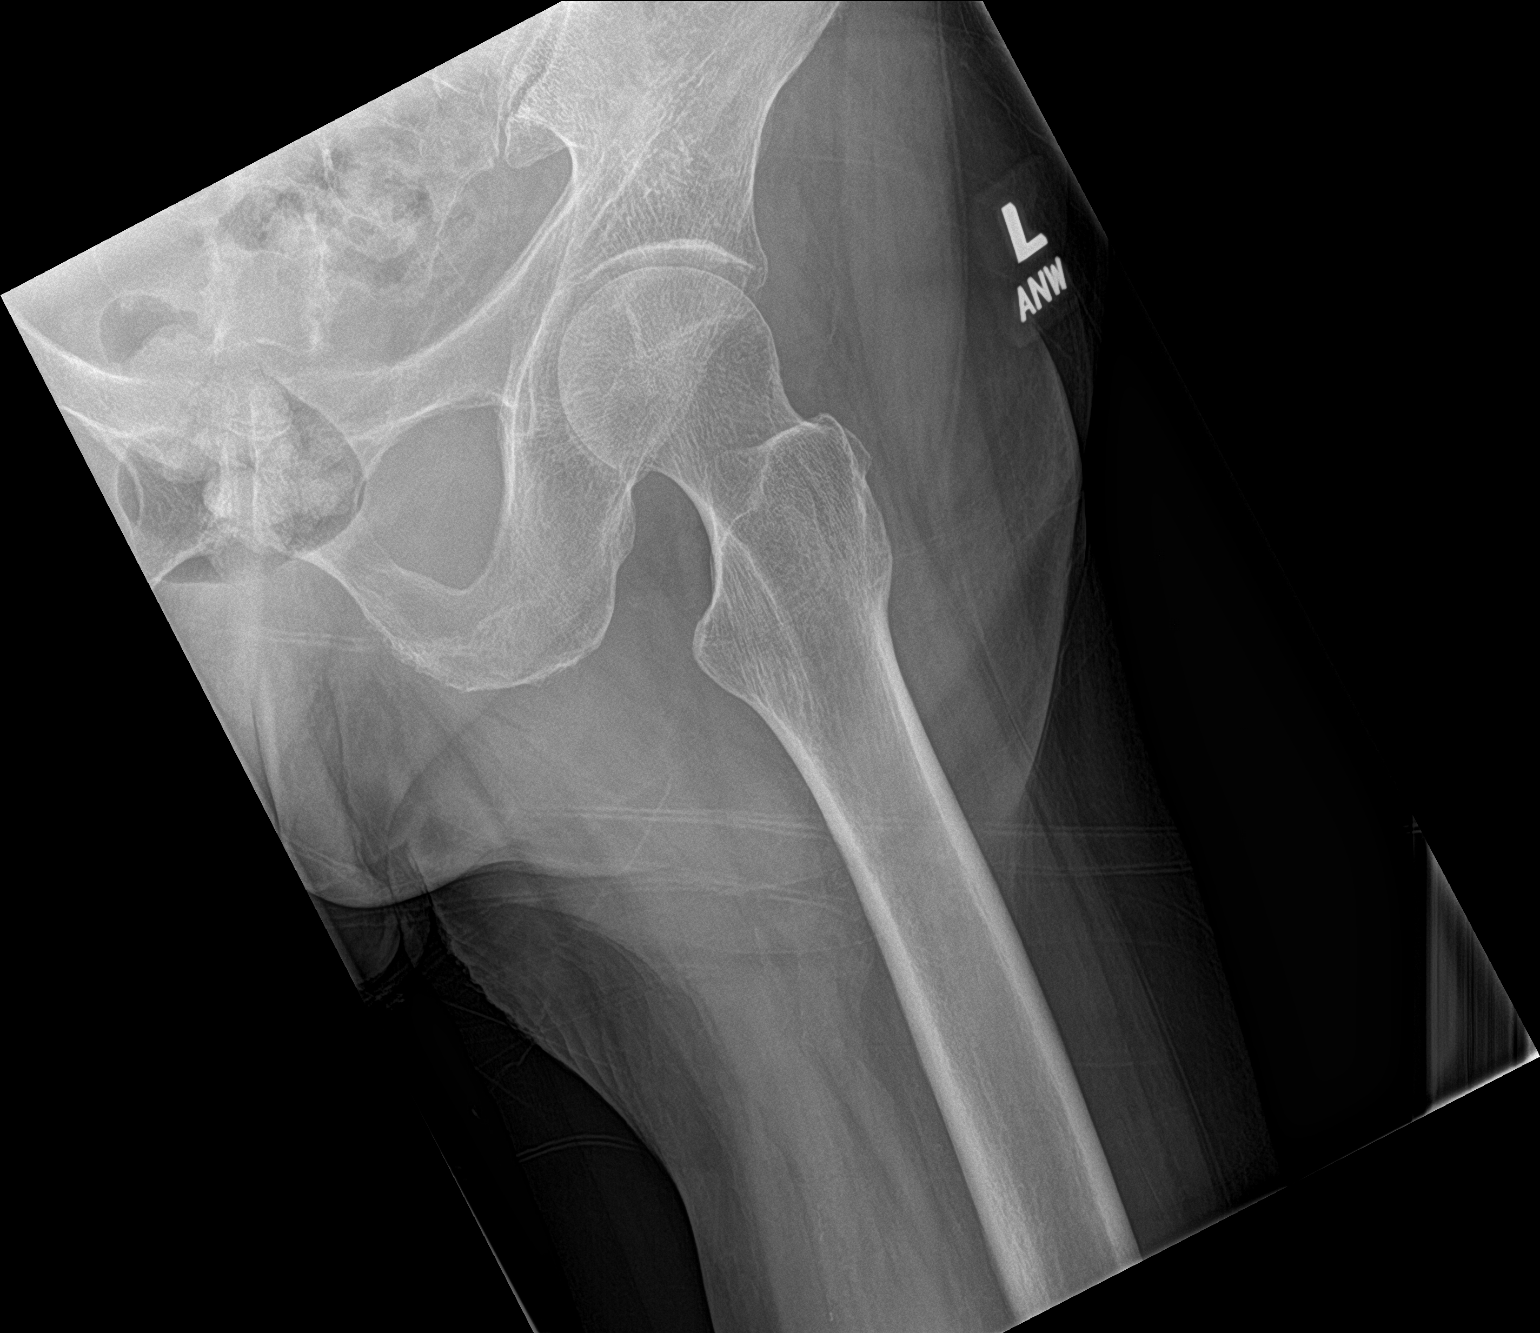

[3 of 3 positions shown; findings below may reference images not displayed]

FINDINGS: There is no evidence of hip fracture or dislocation. Minimal
degenerative changes. No radiopaque foreign body.
IMPRESSION: Negative.

## 2022-07-05 DIAGNOSIS — M542 Cervicalgia: Secondary | ICD-10-CM | POA: Diagnosis not present

## 2022-07-05 DIAGNOSIS — M545 Low back pain, unspecified: Secondary | ICD-10-CM | POA: Diagnosis not present

## 2022-07-05 DIAGNOSIS — M5090 Cervical disc disorder, unspecified, unspecified cervical region: Secondary | ICD-10-CM | POA: Diagnosis not present

## 2022-07-07 DIAGNOSIS — M542 Cervicalgia: Secondary | ICD-10-CM | POA: Diagnosis not present

## 2022-07-07 DIAGNOSIS — M545 Low back pain, unspecified: Secondary | ICD-10-CM | POA: Diagnosis not present

## 2022-07-07 DIAGNOSIS — M5090 Cervical disc disorder, unspecified, unspecified cervical region: Secondary | ICD-10-CM | POA: Diagnosis not present

## 2022-07-09 DIAGNOSIS — M542 Cervicalgia: Secondary | ICD-10-CM | POA: Diagnosis not present

## 2022-07-09 DIAGNOSIS — M5090 Cervical disc disorder, unspecified, unspecified cervical region: Secondary | ICD-10-CM | POA: Diagnosis not present

## 2022-07-09 DIAGNOSIS — M545 Low back pain, unspecified: Secondary | ICD-10-CM | POA: Diagnosis not present

## 2022-07-12 DIAGNOSIS — M545 Low back pain, unspecified: Secondary | ICD-10-CM | POA: Diagnosis not present

## 2022-07-12 DIAGNOSIS — M542 Cervicalgia: Secondary | ICD-10-CM | POA: Diagnosis not present

## 2022-07-12 DIAGNOSIS — M5090 Cervical disc disorder, unspecified, unspecified cervical region: Secondary | ICD-10-CM | POA: Diagnosis not present

## 2022-07-14 DIAGNOSIS — M5090 Cervical disc disorder, unspecified, unspecified cervical region: Secondary | ICD-10-CM | POA: Diagnosis not present

## 2022-07-14 DIAGNOSIS — M542 Cervicalgia: Secondary | ICD-10-CM | POA: Diagnosis not present

## 2022-07-14 DIAGNOSIS — M545 Low back pain, unspecified: Secondary | ICD-10-CM | POA: Diagnosis not present

## 2022-07-27 ENCOUNTER — Ambulatory Visit (HOSPITAL_COMMUNITY): Payer: Medicare PPO | Attending: Cardiovascular Disease

## 2022-07-27 DIAGNOSIS — I255 Ischemic cardiomyopathy: Secondary | ICD-10-CM | POA: Insufficient documentation

## 2022-07-27 LAB — ECHOCARDIOGRAM COMPLETE
Area-P 1/2: 2.63 cm2
S' Lateral: 2.5 cm

## 2022-07-27 MED ORDER — PERFLUTREN LIPID MICROSPHERE
1.0000 mL | INTRAVENOUS | Status: AC | PRN
  Administered 2022-07-27: 3 mL via INTRAVENOUS

## 2022-08-09 ENCOUNTER — Ambulatory Visit: Payer: Medicare PPO | Admitting: Cardiovascular Disease

## 2022-08-09 ENCOUNTER — Encounter: Payer: Self-pay | Admitting: Cardiovascular Disease

## 2022-08-09 ENCOUNTER — Encounter: Payer: Self-pay | Admitting: *Deleted

## 2022-08-09 VITALS — BP 96/62 | HR 68 | Ht 72.0 in | Wt 169.2 lb

## 2022-08-09 DIAGNOSIS — I2109 ST elevation (STEMI) myocardial infarction involving other coronary artery of anterior wall: Secondary | ICD-10-CM | POA: Diagnosis not present

## 2022-08-09 MED ORDER — POTASSIUM CHLORIDE CRYS ER 10 MEQ PO TBCR: 10.0000 meq | EXTENDED_RELEASE_TABLET | ORAL | 1 refills | Status: AC | PRN

## 2022-08-09 MED ORDER — ASPIRIN 81 MG PO TBEC: 81.0000 mg | DELAYED_RELEASE_TABLET | Freq: Every day | ORAL | 3 refills | Status: AC

## 2022-08-09 MED ORDER — FUROSEMIDE 20 MG PO TABS: 20.0000 mg | ORAL_TABLET | Freq: Every day | ORAL | 1 refills | Status: AC | PRN

## 2022-08-09 NOTE — Addendum Note (Signed)
Addended by: Patria Mane A on: 08/09/2022 01:21 PM   Modules accepted: Orders

## 2022-08-09 NOTE — Progress Notes (Signed)
[facility] Office Note    Date:  08/09/2022   ID:  Chad Washington, DOB 10/26/1941, MRN [id number]  PCP:  [name], MD  Cardiologist:  [name], MD   3 month F/U cardiology evaluation  History of Present Illness:  Chad Washington is a 81 y.o. male who is a retired Psychologist, sport and exercise at [employer] who has a history of GERD for which she was on [employer].  On June 06, 2021 he developed new onset chest/shoulder pain and called EMS who arrived at his house and his ECG was reportedly normal.  Later that evening at approximately 10:15 PM he again developed recurrent chest discomfort with arm and shoulder discomfort and presented to [facility] ER where ECG showed anterior ST segment elevation.  A code STEMI was activated.  I performed emergent cardiac catheterization and was found to have severe 95% distal left main stenosis, 95% ostial LAD stenosis, 70% stenosis in the first diagonal vessel, 95% ostial circumflex stenosis with 85% ramus stenosis and mild CAD.  Due to his critical life-threatening anatomy, I called Dr. Roxan Hockey for emergent surgical consultation and CABG revascularization.  I placed an intra-aortic balloon pump for hemodynamic stability and he was taken urgently from the catheterization laboratory for urgent surgery and had a LIMA placed to his LAD, saphenous vein graft to his diagonal and sequential saphenous vein graft to obtuse marginals 1 and 2.  On June 12, 2021 was taken back to the catheterization laboratory due to concerning EKG findings with reduced EF on echo and troponin elevation.  All grafts were widely patent.  Subsequently, he has been evaluated by Coletta Memos, NP in July and more recently October 2022 and has seen Dr. Roxan Hockey several occasions.  Due to increased heart rate, his beta-blocker dose was titrated.  An echo Doppler study on October 20, 2021 showed an EF of 35 to 40% with grade 1 diastolic dysfunction.  RV systolic function was mildly reduced.   Valves were normal.  I last saw him on May 17, 2022 at which time he denied any recurrent chest pain.  He has been seeing  Dr. Ninfa Linden for slight tear in his left rotator cuff.  He denies any palpitations.  He is now on metoprolol tartrate 100 mg twice a day furosemide 20 mg daily, and atorvastatin 40 mg.  He is on aspirin.  He takes Zyrtec for allergies and Dexilant and Singulair.  During that evaluation, recommended a follow-up echo Doppler study for reassessment of LV function with comprehensive follow-up laboratory.   A follow-up echo Doppler study in July 27, 2022.  This showed some improvement in LV function with EF now at 45 to 50% with apical akinesis and hypokinesis of the anterior and anterolateral wall.  Presently, he feels well.  He denies any symptoms of chest pain or shortness of breath.  At times he notes some very mild dizziness.  He exercises regularly at least 5 days/week for minimum of 1 hour of both using weights at a fitness center and walking.  He denies any leg swelling.  His medical regimen consists of aspirin which he apparently has been taking 325 mg.  He is on atorvastatin 40 mg for hyperlipidemia.  He has been taking furosemide 20 mg daily, metoprolol to tartrate 100 mg twice a day.  He denies any palpitations.  He remains asymptomatic and presents for evaluation.   Past Medical History:  Diagnosis Date   Asthma    allergy induced asthma   GERD (gastroesophageal  reflux disease)     Past Surgical History:  Procedure Laterality Date   COLONOSCOPY WITH PROPOFOL N/A 06/21/2017   Procedure: COLONOSCOPY WITH PROPOFOL;  Surgeon: [name], MD;  Location: [facility];  Service: Endoscopy;  Laterality: N/A;   CORONARY ARTERY BYPASS GRAFT N/A 06/06/2021   Procedure: CORONARY ARTERY BYPASS GRAFTING (CABG) TIMES FOUR, ON PUMP, USING LEFT INTERNAL MAMMARY ARTERY AND ENDOSCOPICALLY HARVESTED RIGHT GREATER SAPHENOUS VEIN;  Surgeon: [name], MD;  Location: [place];  Service: Open Heart Surgery;  Laterality: N/A;   ENDOVEIN HARVEST OF GREATER SAPHENOUS VEIN Right 06/06/2021   Procedure: ENDOVEIN HARVEST OF GREATER SAPHENOUS VEIN;  Surgeon: [name], MD;  Location: [place];  Service: Open Heart Surgery;  Laterality: Right;   EYE SURGERY Bilateral 6 yrs ago   ioc lens implants   HERNIA REPAIR     inguinal hernia left 8- 10 yrs ago, right inguinal hernia age 20   IABP INSERTION N/A 06/06/2021   Procedure: IABP Insertion;  Surgeon: [name], MD;  Location: [place] [facility];  Service: Cardiovascular;  Laterality: N/A;   LEFT HEART CATH AND CORONARY ANGIOGRAPHY N/A 06/06/2021   Procedure: LEFT HEART CATH AND CORONARY ANGIOGRAPHY;  Surgeon: [name], MD;  Location: [place] [facility];  Service: Cardiovascular;  Laterality: N/A;   LEFT HEART CATH AND CORS/GRAFTS ANGIOGRAPHY N/A 06/12/2021   Procedure: LEFT HEART CATH AND CORS/GRAFTS ANGIOGRAPHY;  Surgeon: [name], MD;  Location: [place] [facility];  Service: Cardiovascular;  Laterality: N/A;   TEE WITHOUT CARDIOVERSION  06/06/2021   Procedure: TRANSESOPHAGEAL ECHOCARDIOGRAM (TEE);  Surgeon: [name], MD;  Location: [facility] OR;  Service: Open Heart Surgery;;    Current Medications: Outpatient Medications Prior to Visit  Medication Sig Dispense Refill   aspirin EC 325 MG EC tablet Take 1 tablet (325 mg total) by mouth daily. 30 tablet 0   atorvastatin (LIPITOR) 40 MG tablet Take 1 tablet (40 mg total) by mouth daily. 30 tablet 3   azelastine (OPTIVAR) 0.05 % ophthalmic solution Place 1 drop into both eyes 2 (two) times daily.     cetirizine (ZYRTEC) 10 MG tablet Take 10 mg by mouth daily.     DEXILANT 60 MG capsule Take by mouth.     fluticasone (FLOVENT HFA) 110 MCG/ACT inhaler Inhale 1 puff into the lungs 2 (two) times daily.     furosemide (LASIX) 20 MG tablet Take 1 tablet (20 mg total) by mouth daily. 30 tablet 1   metoprolol tartrate (LOPRESSOR) 100 MG tablet Take  100 mg by mouth 2 (two) times daily.     montelukast (SINGULAIR) 10 MG tablet Take 10 mg by mouth at bedtime.     Multiple Vitamins-Minerals (CENTRUM SILVER PO) Take 1 tablet by mouth daily.     potassium chloride (KLOR-CON) 10 MEQ tablet Take 1 tablet (10 mEq total) by mouth daily. 30 tablet 1   albuterol (VENTOLIN HFA) 108 (90 Base) MCG/ACT inhaler 1-2 puffs as needed (Patient not taking: Reported on 08/09/2022)     EPINEPHrine 0.3 mg/0.3 mL IJ SOAJ injection Inject 0.3 mg into the muscle once as needed for anaphylaxis. (Patient not taking: Reported on 08/09/2022)     Meclizine HCl 25 MG CHEW 1 tablet as needed (Patient not taking: Reported on 08/09/2022)     omeprazole (PRILOSEC) 20 MG capsule Take 20 mg by mouth daily.     tadalafil (CIALIS) 20 MG tablet Take 1 tablet (20 mg  total) by mouth daily as needed for erectile dysfunction. (Patient not taking: Reported on 08/09/2022) 10 tablet 0   traMADol (ULTRAM) 50 MG tablet Take 1 tablet (50 mg total) by mouth every 6 (six) hours as needed. (Patient not taking: Reported on 08/09/2022) 12 tablet 0   No facility-administered medications prior to visit.     Allergies:   Augmentin [amoxicillin-pot clavulanate] and Latex   Social History   Socioeconomic History   Marital status: Single    Spouse name: Not on file   Number of children: Not on file   Years of education: Not on file   Highest education level: Not on file  Occupational History   Not on file  Tobacco Use   Smoking status: Never   Smokeless tobacco: Never  Vaping Use   Vaping Use: Never used  Substance and Sexual Activity   Alcohol use: Yes    Alcohol/week: 0.0 standard drinks of alcohol    Comment: occasionally   Drug use: No   Sexual activity: Yes    Partners: Male  Other Topics Concern   Not on file  Social History Narrative   Not on file   Social Determinants of Health   Financial Resource Strain: Not on file  Food Insecurity: Not on file  Transportation Needs: Not  on file  Physical Activity: Not on file  Stress: Not on file  Social Connections: Not on file    He is a retired Public affairs consultant.  His wife also worked at [employer] in [place].  Family History:  The patient's family history includes Cancer in his father; Diabetes in his father; Hypertension in his mother.   ROS General: Negative; No fevers, chills, or night sweats;  HEENT: Negative; No changes in vision or hearing, sinus congestion, difficulty swallowing Pulmonary: Negative; No cough, wheezing, shortness of breath, hemoptysis Cardiovascular: Negative; No chest pain, presyncope, syncope, palpitations GI: Negative; No nausea, vomiting, diarrhea, or abdominal pain GU: Negative; No dysuria, hematuria, or difficulty voiding Musculoskeletal: Negative; no myalgias, joint pain, or weakness Hematologic/Oncology: Negative; no easy bruising, bleeding Endocrine: Negative; no heat/cold intolerance; no diabetes Neuro: Negative; no changes in balance, headaches Skin: Negative; No rashes or skin lesions Psychiatric: Negative; No behavioral problems, depression Sleep: Negative; No snoring, daytime sleepiness, hypersomnolence, bruxism, restless legs, hypnogognic hallucinations, no cataplexy Other comprehensive 14 point system review is negative.   PHYSICAL EXAM:   VS:  BP 96/62 (BP Location: Left Arm, Patient Position: Sitting, Cuff Size: Normal)   Pulse 68   Ht 6' (1.829 m)   Wt 169 lb 3.2 oz (76.7 kg)   SpO2 93%   BMI 22.95 kg/m     Repeatblood pressure by me was 98/64 supine and 92/60 standing  Wt Readings from Last 3 Encounters:  08/09/22 169 lb 3.2 oz (76.7 kg)  05/17/22 171 lb 9.6 oz (77.8 kg)  10/05/21 171 lb 9.6 oz (77.8 kg)    General: Alert, oriented, no distress.  Skin: normal turgor, no rashes, warm and dry HEENT: Normocephalic, atraumatic. Pupils equal round and reactive to light; sclera anicteric; extraocular muscles intact;  Nose without nasal septal  hypertrophy Mouth/Parynx benign; Mallinpatti scale 3 Neck: No JVD, no carotid bruits; normal carotid upstroke Lungs: clear to ausculatation and percussion; no wheezing or rales Chest wall: without tenderness to palpitation Heart: PMI not displaced, RRR, s1 s2 normal, 1/6 systolic murmur, no diastolic murmur, no rubs, gallops, thrills, or heaves Abdomen: soft, nontender; no hepatosplenomehaly, BS+; abdominal aorta nontender and not  dilated by palpation. Back: no CVA tenderness Pulses 2+ Musculoskeletal: full range of motion, normal strength, no joint deformities Extremities: no clubbing cyanosis or edema, Homan's sign negative  Neurologic: grossly nonfocal; Cranial nerves grossly wnl Psychologic: Normal mood and affect    Studies/Labs Reviewed:   August 09, 2022 ECG (independently read by me): NSR at 62, QS V1-2 with mild T wave inversion   May 17, 2022 ECG (independently read by me): NSR at 68, QS v1-2; 0.5 mm J point elevation inferiorly; no ectopy  Recent Labs:    Latest Ref Rng & Units 12/24/2021    2:46 PM 06/15/2021    2:32 AM 06/14/2021    1:37 AM  BMP  Glucose 70 - 99 mg/dL 91  106  113   BUN 8 - 23 mg/dL _0 Creatinine 0.61 - 1.24 mg/dL 0.92  0.99  0.91   Sodium 135 - 145 mmol/L 137  136  138   Potassium 3.5 - 5.1 mmol/L 3.8  3.7  3.9   Chloride 98 - 111 mmol/L 101  101  102   CO2 22 - 32 mmol/L _1 Calcium 8.9 - 10.3 mg/dL 8.9  8.8  8.7         Latest Ref Rng & Units 06/06/2021   11:55 PM 06/06/2021   10:52 PM  Hepatic Function  Total Protein 6.5 - 8.1 g/dL 6.3  6.6   Albumin 3.5 - 5.0 g/dL 3.5  4.0   AST 15 - 41 U/L 38  39   ALT 0 - 44 U/L 29  32   Alk Phosphatase 38 - 126 U/L 39  39   Total Bilirubin 0.3 - 1.2 mg/dL 0.7  0.7        Latest Ref Rng & Units 12/24/2021    2:46 PM 06/10/2021   12:54 AM 06/09/2021    5:07 AM  CBC  WBC 4.0 - 10.5 K/uL 8.6  14.2  12.7   Hemoglobin 13.0 - 17.0 g/dL 14.6  12.5  11.2   Hematocrit 39.0 - 52.0 %  43.9  36.0  33.6   Platelets 150 - 400 K/uL 302  129  101    Lab Results  Component Value Date   MCV 95.2 12/24/2021   MCV 93.5 06/10/2021   MCV 97.1 06/09/2021   No results found for: "TSH" Lab Results  Component Value Date   HGBA1C 5.0 06/08/2021     BNP    Component Value Date/Time   BNP 764.2 (H) 06/14/2021 0137    ProBNP No results found for: "PROBNP"   Lipid Panel     Component Value Date/Time   CHOL 168 06/06/2021 2355   TRIG 46 06/06/2021 2355   HDL 37 (L) 06/06/2021 2355   CHOLHDL 4.5 06/06/2021 2355   VLDL 9 06/06/2021 2355   LDLCALC 122 (H) 06/06/2021 2355     RADIOLOGY: ECHOCARDIOGRAM COMPLETE  Result Date: 07/27/2022    ECHOCARDIOGRAM REPORT   Patient Name:   Chad Washington Date of Exam: 07/27/2022 Medical Rec #:  [id number]           Height:       72.0 in Accession #:    [id number]          Weight:       171.6 lb Date of Birth:  October 26, 1941           BSA:  1.996 m Patient Age:    11 years            BP:           107/68 mmHg Patient Gender: M                   HR:           61 bpm. Exam Location:  [place] Procedure: 2D Echo, Cardiac Doppler, Color Doppler and Intracardiac            Opacification Agent Indications:    I25.5 Ischemic cardiomyopathy  History:        Patient has prior history of Echocardiogram examinations, most                 recent 10/20/2021. Cardiomyopathy and CHF, CAD; Prior CABG.  Sonographer:    Wilford Sports Rodgers-Jones RDCS Referring Phys: [place]  1. Left ventricular ejection fraction, by estimation, is 45 to 50%. The left ventricle has mildly decreased function. The left ventricle has no regional wall motion abnormalities. Left ventricular diastolic parameters are consistent with Grade I diastolic dysfunction (impaired relaxation).  2. Right ventricular systolic function is normal. The right ventricular size is normal.  3. The mitral valve is normal in structure. Trivial mitral valve regurgitation. No  evidence of mitral stenosis.  4. The aortic valve is tricuspid. Aortic valve regurgitation is not visualized. No aortic stenosis is present.  5. Aortic dilatation noted. There is mild dilatation of the aortic root, measuring 37 mm. There is borderline dilatation of the ascending aorta, measuring 36 mm.  6. The inferior vena cava is normal in size with greater than 50% respiratory variability, suggesting right atrial pressure of 3 mmHg. FINDINGS  Left Ventricle: Left ventricular ejection fraction, by estimation, is 45 to 50%. The left ventricle has mildly decreased function. The left ventricle has no regional wall motion abnormalities. Definity contrast agent was given IV to delineate the left ventricular endocardial borders. The left ventricular internal cavity size was normal in size. There is no left ventricular hypertrophy. Left ventricular diastolic parameters are consistent with Grade I diastolic dysfunction (impaired relaxation). Normal  left ventricular filling pressure.  LV Wall Scoring: The apex is akinetic. The entire anterior septum, apical lateral segment, and apical anterior segment are hypokinetic. The anterior wall, antero-lateral wall, entire inferior wall, posterior wall, mid inferoseptal segment, and basal inferoseptal segment are normal. Right Ventricle: The right ventricular size is normal. No increase in right ventricular wall thickness. Right ventricular systolic function is normal. Left Atrium: Left atrial size was normal in size. Right Atrium: Right atrial size was normal in size. Pericardium: There is no evidence of pericardial effusion. Mitral Valve: The mitral valve is normal in structure. Trivial mitral valve regurgitation. No evidence of mitral valve stenosis. Tricuspid Valve: The tricuspid valve is normal in structure. Tricuspid valve regurgitation is trivial. No evidence of tricuspid stenosis. Aortic Valve: The aortic valve is tricuspid. Aortic valve regurgitation is not visualized. No  aortic stenosis is present. Pulmonic Valve: The pulmonic valve was normal in structure. Pulmonic valve regurgitation is not visualized. No evidence of pulmonic stenosis. Aorta: Aortic dilatation noted. There is mild dilatation of the aortic root, measuring 37 mm. There is borderline dilatation of the ascending aorta, measuring 36 mm. Venous: The inferior vena cava is normal in size with greater than 50% respiratory variability, suggesting right atrial pressure of 3 mmHg. IAS/Shunts: No atrial level shunt detected by color flow Doppler.  LEFT VENTRICLE PLAX 2D LVIDd:         4.10 cm   Diastology LVIDs:         2.50 cm   LV e' medial:    6.96 cm/s LV PW:         0.80 cm   LV E/e' medial:  8.5 LV IVS:        0.80 cm   LV e' lateral:   7.29 cm/s LVOT diam:     1.90 cm   LV E/e' lateral: 8.1 LV SV:         52 LV SV Index:   26 LVOT Area:     2.84 cm  RIGHT VENTRICLE RV Basal diam:  3.50 cm RV S prime:     6.31 cm/s TAPSE (M-mode): 1.1 cm LEFT ATRIUM             Index        RIGHT ATRIUM           Index LA diam:        4.30 cm 2.15 cm/m   RA Area:     10.40 cm LA Vol (A2C):   36.3 ml 18.18 ml/m  RA Volume:   21.30 ml  10.67 ml/m LA Vol (A4C):   30.4 ml 15.23 ml/m LA Biplane Vol: 33.0 ml 16.53 ml/m  AORTIC VALVE LVOT Vmax:   84.10 cm/s LVOT Vmean:  55.250 cm/s LVOT VTI:    0.183 m  AORTA Ao Root diam: 3.70 cm Ao Asc diam:  3.60 cm MITRAL VALVE               TRICUSPID VALVE MV Area (PHT): 2.63 cm    TR Peak grad:   16.2 mmHg MV Decel Time: 289 msec    TR Vmax:        201.00 cm/s MV E velocity: 58.95 cm/s MV A velocity: 83.85 cm/s  SHUNTS MV E/A ratio:  0.70        Systemic VTI:  0.18 m                            Systemic Diam: 1.90 cm Skeet Latch MD Electronically signed by Skeet Latch MD Signature Date/Time: 07/27/2022/6:38:42 PM    Final      Additional studies/ records that were reviewed today include:   Ramus lesion is 85% stenosed. Dist LM to Ost LAD lesion is 95% stenosed. Ost Cx lesion is 95%  stenosed. Prox LAD lesion is 95% stenosed. 1st Diag lesion is 70% stenosed. Prox RCA lesion is 30% stenosed. RPDA lesion is 50% stenosed.   Severe 95% distal left main stenosis percent proximal LAD stenosis, 70% diffuse first diagonal stenosis, 95% ostial left circumflex stenosis.  The RCA has mild luminal irregularity with narrowings of 20 and 30%.  There is a 50% ostial narrowing and a small PDA vessel.   Acute LV dysfunction with EF estimate approximately 50% with mild distal anterior lateral/apical hypocontractility.  LVEDP 23 mmHg.   Insertion of intra-aortic balloon pump for hemodynamic support prior to emergent CABG surgery this evening.   RECOMMENDATION: The patient will be taken to the operating room by Dr. Modesto Charon emergently for critical left main stenosis with subtotal LAD and ostial circumflex stenoses.       ECHO: 10/20/2021  Moderate global hypokinesis , apex akinetic. Left ventricular ejection fraction, by estimation, is 35 to 40%. The left ventricle has moderately decreased function. Left  ventricular diastolic parameters are consistent with Grade I diastolic dysfunction (impaired relaxation).  Right ventricular systolic function is mildly reduced. The right ventricular size is normal. There is normal pulmonary artery systolic pressure.  The mitral valve is normal in structure. No evidence of mitral valve regurgitation. The aortic valve is normal in structure. Aortic valve regurgitation is not visualized. No aortic stenosis is present.  The inferior vena cava is normal in size with greater than 50% respiratory variability, suggesting right atrial pressure of 3 mmHg.  Comparison(s): 06/11/21 EF 35-40%. Conclusion(s)/Recommendation(s): No significant changes echo 06/11/2021.  ASSESSMENT:    No diagnosis found.   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  He was found to have critical life-threatening anatomy on emergent cardiac catheterization performed by me and intra-aortic balloon pump was inserted and he underwent emergent CABG revascularization surgery by Dr. Roxan Hockey with placement of a LIMA graft to his LAD, saphenous vein graft to his diagonal, and sequential saphenous vein graft to his OM1 and OM 2 vessels.  His RCA had mild disease and was not bypassed.  A repeat catheterization was done 6 days later when there were concerns of ECG changes and all grafts were patent.  He has a resultant ischemic cardiomyopathy with his ejection fraction at 35 to 40% on echo October 20, 2021.  I had not seen him till May 17, 2022 in the office at that time he remained stable follow-up laboratory was recommended in addition to an echo Doppler assessment.  His echo Doppler study was done in July 27, 2022 which shows some improvement in LV function with EF now at 45 to 50% with akinesis of the apex and hypokinesis anteriorly consistent with his prior ACS/ischemic cardiomyopathy.  He had grade 1 diastolic dysfunction.  Aortic root dimension was 36 mm.  Presently, he has been taking aspirin 325 mg and I have recommended he decrease this to 80 well milligrams daily.  He has been exercising regularly 5 days/week for at least an hour and denies any chest pain or exertional dyspnea.  He denies any leg swelling and has been taking furosemide.  He also notes some mild dizziness.  His blood pressure today is low and was 98/64 supine and 92/60 standing.  I have recommended he discontinue Lasix with his supplemental potassium and only take this on an as-needed basis if there is recurrent leg swelling.  His ventricular rate is controlled in the 60s on his metoprolol dose.  In the future I would recommend transitioning him to succinate rather than tartrate but would not do this presently with his low  blood pressure.  Sees Dr. Deforest Hoyles for primary care who checks laboratory.  Target LDL is less than 70 and if he is not at target further titration of atorvastatin or addition of Zetia or possible PCSK9 inhibition may be necessary.  I will see him in 6 months for reevaluation or sooner as needed.    Medication Adjustments/Labs and Tests Ordered: Current medicines are reviewed at length with the patient today.  Concerns regarding medicines are outlined above.  Medication changes, Labs and Tests ordered today are listed in the Patient Instructions below. There are no Patient Instructions on file for this visit.   Signed, [name], MD  08/09/2022 11:27 AM    [place] [place] [address], [place], [place]  [address] Phone: [phone]    

## 2022-08-09 NOTE — Patient Instructions (Signed)
Medication Instructions:  Change furosemide (Lasix) to AS NEEDED  *If you need a refill on your cardiac medications before your next appointment, please call your pharmacy*  Follow-Up: At [facility], you and your health needs are our priority.  As part of our continuing mission to provide you with exceptional heart care, we have created designated Provider Care Teams.  These Care Teams include your primary Cardiologist (physician) and Advanced Practice Providers (APPs -  Physician Assistants and Nurse Practitioners) who all work together to provide you with the care you need, when you need it.  We recommend signing up for the patient portal called "MyChart".  Sign up information is provided on this After Visit Summary.  MyChart is used to connect with patients for Virtual Visits (Telemedicine).  Patients are able to view lab/test results, encounter notes, upcoming appointments, etc.  Non-urgent messages can be sent to your provider as well.   To learn more about what you can do with MyChart, go to [email or web address].    Your next appointment:   6 month(s)  The format for your next appointment:   In Person  Provider:   [name], MD {  Important Information About Sugar

## 2022-09-22 DIAGNOSIS — R052 Subacute cough: Secondary | ICD-10-CM | POA: Diagnosis not present

## 2022-09-22 DIAGNOSIS — J3081 Allergic rhinitis due to animal (cat) (dog) hair and dander: Secondary | ICD-10-CM | POA: Diagnosis not present

## 2022-09-22 DIAGNOSIS — J301 Allergic rhinitis due to pollen: Secondary | ICD-10-CM | POA: Diagnosis not present

## 2022-09-22 DIAGNOSIS — J3089 Other allergic rhinitis: Secondary | ICD-10-CM | POA: Diagnosis not present

## 2022-09-22 DIAGNOSIS — H1045 Other chronic allergic conjunctivitis: Secondary | ICD-10-CM | POA: Diagnosis not present

## 2022-09-28 DIAGNOSIS — C4442 Squamous cell carcinoma of skin of scalp and neck: Secondary | ICD-10-CM | POA: Diagnosis not present

## 2022-09-28 DIAGNOSIS — L570 Actinic keratosis: Secondary | ICD-10-CM | POA: Diagnosis not present

## 2022-09-28 DIAGNOSIS — D225 Melanocytic nevi of trunk: Secondary | ICD-10-CM | POA: Diagnosis not present

## 2022-09-28 DIAGNOSIS — L814 Other melanin hyperpigmentation: Secondary | ICD-10-CM | POA: Diagnosis not present

## 2022-09-28 DIAGNOSIS — L821 Other seborrheic keratosis: Secondary | ICD-10-CM | POA: Diagnosis not present

## 2022-09-28 DIAGNOSIS — L601 Onycholysis: Secondary | ICD-10-CM | POA: Diagnosis not present

## 2022-09-28 DIAGNOSIS — D492 Neoplasm of unspecified behavior of bone, soft tissue, and skin: Secondary | ICD-10-CM | POA: Diagnosis not present

## 2022-10-21 DIAGNOSIS — C4442 Squamous cell carcinoma of skin of scalp and neck: Secondary | ICD-10-CM | POA: Diagnosis not present

## 2022-10-28 DIAGNOSIS — M5090 Cervical disc disorder, unspecified, unspecified cervical region: Secondary | ICD-10-CM | POA: Diagnosis not present

## 2022-10-28 DIAGNOSIS — I509 Heart failure, unspecified: Secondary | ICD-10-CM | POA: Diagnosis not present

## 2022-10-28 DIAGNOSIS — I255 Ischemic cardiomyopathy: Secondary | ICD-10-CM | POA: Diagnosis not present

## 2022-10-28 DIAGNOSIS — K5900 Constipation, unspecified: Secondary | ICD-10-CM | POA: Diagnosis not present

## 2022-10-28 DIAGNOSIS — K589 Irritable bowel syndrome without diarrhea: Secondary | ICD-10-CM | POA: Diagnosis not present

## 2022-10-28 DIAGNOSIS — Z23 Encounter for immunization: Secondary | ICD-10-CM | POA: Diagnosis not present

## 2022-10-28 DIAGNOSIS — Z1331 Encounter for screening for depression: Secondary | ICD-10-CM | POA: Diagnosis not present

## 2022-10-28 DIAGNOSIS — Z0000 Encounter for general adult medical examination without abnormal findings: Secondary | ICD-10-CM | POA: Diagnosis not present

## 2022-10-28 DIAGNOSIS — E785 Hyperlipidemia, unspecified: Secondary | ICD-10-CM | POA: Diagnosis not present

## 2022-10-28 DIAGNOSIS — I2510 Atherosclerotic heart disease of native coronary artery without angina pectoris: Secondary | ICD-10-CM | POA: Diagnosis not present

## 2022-10-28 DIAGNOSIS — K219 Gastro-esophageal reflux disease without esophagitis: Secondary | ICD-10-CM | POA: Diagnosis not present

## 2022-11-08 DIAGNOSIS — M5090 Cervical disc disorder, unspecified, unspecified cervical region: Secondary | ICD-10-CM | POA: Diagnosis not present

## 2022-11-08 DIAGNOSIS — M6281 Muscle weakness (generalized): Secondary | ICD-10-CM | POA: Diagnosis not present

## 2022-11-08 DIAGNOSIS — M542 Cervicalgia: Secondary | ICD-10-CM | POA: Diagnosis not present

## 2022-11-10 DIAGNOSIS — M5090 Cervical disc disorder, unspecified, unspecified cervical region: Secondary | ICD-10-CM | POA: Diagnosis not present

## 2022-11-10 DIAGNOSIS — M6281 Muscle weakness (generalized): Secondary | ICD-10-CM | POA: Diagnosis not present

## 2022-11-10 DIAGNOSIS — M542 Cervicalgia: Secondary | ICD-10-CM | POA: Diagnosis not present

## 2022-11-16 DIAGNOSIS — M5090 Cervical disc disorder, unspecified, unspecified cervical region: Secondary | ICD-10-CM | POA: Diagnosis not present

## 2022-11-16 DIAGNOSIS — M6281 Muscle weakness (generalized): Secondary | ICD-10-CM | POA: Diagnosis not present

## 2022-11-16 DIAGNOSIS — M542 Cervicalgia: Secondary | ICD-10-CM | POA: Diagnosis not present

## 2022-11-19 DIAGNOSIS — M6281 Muscle weakness (generalized): Secondary | ICD-10-CM | POA: Diagnosis not present

## 2022-11-19 DIAGNOSIS — M5090 Cervical disc disorder, unspecified, unspecified cervical region: Secondary | ICD-10-CM | POA: Diagnosis not present

## 2022-11-19 DIAGNOSIS — M542 Cervicalgia: Secondary | ICD-10-CM | POA: Diagnosis not present

## 2022-11-23 DIAGNOSIS — M6281 Muscle weakness (generalized): Secondary | ICD-10-CM | POA: Diagnosis not present

## 2022-11-23 DIAGNOSIS — M542 Cervicalgia: Secondary | ICD-10-CM | POA: Diagnosis not present

## 2022-11-23 DIAGNOSIS — M5090 Cervical disc disorder, unspecified, unspecified cervical region: Secondary | ICD-10-CM | POA: Diagnosis not present

## 2022-11-25 DIAGNOSIS — L578 Other skin changes due to chronic exposure to nonionizing radiation: Secondary | ICD-10-CM | POA: Diagnosis not present

## 2022-11-25 DIAGNOSIS — D0422 Carcinoma in situ of skin of left ear and external auricular canal: Secondary | ICD-10-CM | POA: Diagnosis not present

## 2022-11-25 DIAGNOSIS — L570 Actinic keratosis: Secondary | ICD-10-CM | POA: Diagnosis not present

## 2022-11-26 DIAGNOSIS — M5090 Cervical disc disorder, unspecified, unspecified cervical region: Secondary | ICD-10-CM | POA: Diagnosis not present

## 2022-11-26 DIAGNOSIS — M542 Cervicalgia: Secondary | ICD-10-CM | POA: Diagnosis not present

## 2022-11-26 DIAGNOSIS — M6281 Muscle weakness (generalized): Secondary | ICD-10-CM | POA: Diagnosis not present

## 2022-11-30 DIAGNOSIS — M6281 Muscle weakness (generalized): Secondary | ICD-10-CM | POA: Diagnosis not present

## 2022-11-30 DIAGNOSIS — M542 Cervicalgia: Secondary | ICD-10-CM | POA: Diagnosis not present

## 2022-11-30 DIAGNOSIS — M5090 Cervical disc disorder, unspecified, unspecified cervical region: Secondary | ICD-10-CM | POA: Diagnosis not present

## 2022-12-02 DIAGNOSIS — M6281 Muscle weakness (generalized): Secondary | ICD-10-CM | POA: Diagnosis not present

## 2022-12-02 DIAGNOSIS — M5090 Cervical disc disorder, unspecified, unspecified cervical region: Secondary | ICD-10-CM | POA: Diagnosis not present

## 2022-12-02 DIAGNOSIS — M542 Cervicalgia: Secondary | ICD-10-CM | POA: Diagnosis not present

## 2022-12-08 DIAGNOSIS — M5090 Cervical disc disorder, unspecified, unspecified cervical region: Secondary | ICD-10-CM | POA: Diagnosis not present

## 2022-12-08 DIAGNOSIS — M542 Cervicalgia: Secondary | ICD-10-CM | POA: Diagnosis not present

## 2022-12-08 DIAGNOSIS — M6281 Muscle weakness (generalized): Secondary | ICD-10-CM | POA: Diagnosis not present

## 2022-12-10 DIAGNOSIS — M6281 Muscle weakness (generalized): Secondary | ICD-10-CM | POA: Diagnosis not present

## 2022-12-10 DIAGNOSIS — M542 Cervicalgia: Secondary | ICD-10-CM | POA: Diagnosis not present

## 2022-12-10 DIAGNOSIS — M5090 Cervical disc disorder, unspecified, unspecified cervical region: Secondary | ICD-10-CM | POA: Diagnosis not present

## 2022-12-24 DIAGNOSIS — J40 Bronchitis, not specified as acute or chronic: Secondary | ICD-10-CM | POA: Diagnosis not present

## 2023-02-07 ENCOUNTER — Encounter: Payer: Self-pay | Admitting: Cardiovascular Disease

## 2023-02-07 ENCOUNTER — Ambulatory Visit: Payer: Medicare PPO | Attending: Cardiovascular Disease | Admitting: Cardiovascular Disease

## 2023-02-07 DIAGNOSIS — K219 Gastro-esophageal reflux disease without esophagitis: Secondary | ICD-10-CM | POA: Diagnosis not present

## 2023-02-07 DIAGNOSIS — J4520 Mild intermittent asthma, uncomplicated: Secondary | ICD-10-CM

## 2023-02-07 DIAGNOSIS — I5189 Other ill-defined heart diseases: Secondary | ICD-10-CM

## 2023-02-07 DIAGNOSIS — I5043 Acute on chronic combined systolic (congestive) and diastolic (congestive) heart failure: Secondary | ICD-10-CM

## 2023-02-07 DIAGNOSIS — I255 Ischemic cardiomyopathy: Secondary | ICD-10-CM

## 2023-02-07 DIAGNOSIS — E785 Hyperlipidemia, unspecified: Secondary | ICD-10-CM

## 2023-02-07 DIAGNOSIS — I2109 ST elevation (STEMI) myocardial infarction involving other coronary artery of anterior wall: Secondary | ICD-10-CM

## 2023-02-07 DIAGNOSIS — Z951 Presence of aortocoronary bypass graft: Secondary | ICD-10-CM

## 2023-02-07 NOTE — Progress Notes (Signed)
[facility] Office Note    Date:  02/12/2023   ID:  Chad Washington, DOB 04-Jan-1941, MRN [id number]  PCP:  [name], MD  Cardiologist:  [name], MD   6 month F/U cardiology evaluation  History of Present Illness:  Chad Washington is a 82 y.o. male who is a retired Psychologist, sport and exercise at [employer] who has a history of GERD for which she was on [employer].  On June 06, 2021 he developed new onset chest/shoulder pain and called EMS who arrived at his house and his ECG was reportedly normal.  Later that evening at approximately 10:15 PM he again developed recurrent chest discomfort with arm and shoulder discomfort and presented to [facility] where ECG showed anterior ST segment elevation.  A code STEMI was activated.  I performed emergent cardiac catheterization and was found to have severe 95% distal left main stenosis, 95% ostial LAD stenosis, 70% stenosis in the first diagonal vessel, 95% ostial circumflex stenosis with 85% ramus stenosis and mild CAD.  Due to his critical life-threatening anatomy, I called Dr. Roxan Hockey for emergent surgical consultation and CABG revascularization.  I placed an intra-aortic balloon pump for hemodynamic stability and he was taken urgently from the catheterization laboratory for urgent surgery and had a LIMA placed to his LAD, saphenous vein graft to his diagonal and sequential saphenous vein graft to obtuse marginals 1 and 2.  On June 12, 2021 was taken back to the catheterization laboratory due to concerning EKG findings with reduced EF on echo and troponin elevation.  All grafts were widely patent.  Subsequently, he has been evaluated by Coletta Memos, NP in July and more recently October 2022 and has seen Dr. Roxan Hockey several occasions.  Due to increased heart rate, his beta-blocker dose was titrated.  An echo Doppler study on October 20, 2021 showed an EF of 35 to 40% with grade 1 diastolic dysfunction.  RV systolic function was mildly reduced.   Valves were normal.  I saw him on May 17, 2022 at which time he denied any recurrent chest pain.  He has been seeing  Dr. Ninfa Linden for slight tear in his left rotator cuff.  He denies any palpitations.  He is now on metoprolol tartrate 100 mg twice a day furosemide 20 mg daily, and atorvastatin 40 mg.  He is on aspirin.  He takes Zyrtec for allergies and Dexilant and Singulair.  During that evaluation, recommended a follow-up echo Doppler study for reassessment of LV function with comprehensive follow-up laboratory.  A follow-up echo Doppler study on July 27, 2022 showed some improvement in LV function with EF now at 45 to 50% with apical akinesis and hypokinesis of the anterior and anterolateral wall.  There was grade 1 diastolic dysfunction.  I last saw him on August 09, 2022.  He felt well and denied any symptoms of chest pain or shortness of breath.  At times he notes some very mild dizziness.  He exercises regularly at least 5 days/week for minimum of 1 hour of both using weights at a fitness center and walking.  He denies any leg swelling.  His medical regimen consists of aspirin which he apparently has been taking 325 mg.  He is on atorvastatin 40 mg for hyperlipidemia.  He has been taking furosemide 20 mg daily, metoprolol to tartrate 100 mg twice a day.  He denies any palpitations.  During that evaluation, his blood pressure was low at 98/64 and I recommended he discontinue Lasix with supplemental  potassium and only take this on an as-needed basis for recurrent leg swelling.  Since I last saw him, he has continued to remain asymptomatic.  He is followed by Dr. Deforest Hoyles at [facility].  He lives at [facility] in independent living.  He stays active.  He continues to be on atorvastatin 40 mg for hyperlipidemia.  He is on metoprolol tartrate 100 mg twice a day.  He takes Dexilant 60 mg daily.  He uses Arnuity Ellipta 1 inhalation in addition to montelukast and Flovent inhaler therapy for  his lungs.  He presents for evaluation.   Past Medical History:  Diagnosis Date   Asthma    allergy induced asthma   GERD (gastroesophageal reflux disease)     Past Surgical History:  Procedure Laterality Date   COLONOSCOPY WITH PROPOFOL N/A 06/21/2017   Procedure: COLONOSCOPY WITH PROPOFOL;  Surgeon: [name], MD;  Location: [facility];  Service: Endoscopy;  Laterality: N/A;   CORONARY ARTERY BYPASS GRAFT N/A 06/06/2021   Procedure: CORONARY ARTERY BYPASS GRAFTING (CABG) TIMES FOUR, ON PUMP, USING LEFT INTERNAL MAMMARY ARTERY AND ENDOSCOPICALLY HARVESTED RIGHT GREATER SAPHENOUS VEIN;  Surgeon: [name], MD;  Location: [facility];  Service: Open Heart Surgery;  Laterality: N/A;   ENDOVEIN HARVEST OF GREATER SAPHENOUS VEIN Right 06/06/2021   Procedure: ENDOVEIN HARVEST OF GREATER SAPHENOUS VEIN;  Surgeon: [name], MD;  Location: [place];  Service: Open Heart Surgery;  Laterality: Right;   EYE SURGERY Bilateral 6 yrs ago   ioc lens implants   HERNIA REPAIR     inguinal hernia left 8- 10 yrs ago, right inguinal hernia age 5   IABP INSERTION N/A 06/06/2021   Procedure: IABP Insertion;  Surgeon: [name], MD;  Location: [facility];  Service: Cardiovascular;  Laterality: N/A;   LEFT HEART CATH AND CORONARY ANGIOGRAPHY N/A 06/06/2021   Procedure: LEFT HEART CATH AND CORONARY ANGIOGRAPHY;  Surgeon: [name], MD;  Location: [place] [facility];  Service: Cardiovascular;  Laterality: N/A;   LEFT HEART CATH AND CORS/GRAFTS ANGIOGRAPHY N/A 06/12/2021   Procedure: LEFT HEART CATH AND CORS/GRAFTS ANGIOGRAPHY;  Surgeon: [name], MD;  Location: [facility];  Service: Cardiovascular;  Laterality: N/A;   TEE WITHOUT CARDIOVERSION  06/06/2021   Procedure: TRANSESOPHAGEAL ECHOCARDIOGRAM (TEE);  Surgeon: [name], MD;  Location: [facility] OR;  Service: Open Heart Surgery;;    Current Medications: Outpatient Medications Prior to Visit   Medication Sig Dispense Refill   ARNUITY ELLIPTA 200 MCG/ACT AEPB Inhale 1 puff into the lungs daily.     aspirin EC 81 MG tablet Take 1 tablet (81 mg total) by mouth daily. 90 tablet 3   atorvastatin (LIPITOR) 40 MG tablet Take 1 tablet (40 mg total) by mouth daily. 30 tablet 3   azelastine (OPTIVAR) 0.05 % ophthalmic solution Place 1 drop into both eyes 2 (two) times daily.     Azelastine HCl 137 MCG/SPRAY SOLN Place 1 spray into both nostrils at bedtime.     cetirizine (ZYRTEC) 10 MG tablet Take 10 mg by mouth daily.     DEXILANT 60 MG capsule Take 60 mg by mouth daily before breakfast.     fluticasone (FLONASE) 50 MCG/ACT nasal spray Place 1 spray into both nostrils daily.     fluticasone (FLOVENT HFA) 110 MCG/ACT inhaler Inhale 1 puff into the lungs 2 (two) times daily.     metoprolol tartrate (LOPRESSOR) 100 MG tablet Take 100  mg by mouth 2 (two) times daily.     montelukast (SINGULAIR) 10 MG tablet Take 10 mg by mouth at bedtime.     Multiple Vitamins-Minerals (CENTRUM SILVER PO) Take 1 tablet by mouth daily.     EPINEPHrine 0.3 mg/0.3 mL IJ SOAJ injection Inject 0.3 mg into the muscle once as needed for anaphylaxis. (Patient not taking: Reported on 02/07/2023)     furosemide (LASIX) 20 MG tablet Take 1 tablet (20 mg total) by mouth daily as needed. (Patient not taking: Reported on 02/07/2023) 30 tablet 1   potassium chloride (KLOR-CON M) 10 MEQ tablet Take 1 tablet (10 mEq total) by mouth as needed (with lasix). (Patient not taking: Reported on 02/07/2023) 30 tablet 1   No facility-administered medications prior to visit.     Allergies:   Augmentin [amoxicillin-pot clavulanate]   Social History   Socioeconomic History   Marital status: Single    Spouse name: Not on file   Number of children: Not on file   Years of education: Not on file   Highest education level: Not on file  Occupational History   Not on file  Tobacco Use   Smoking status: Never   Smokeless tobacco: Never   Vaping Use   Vaping Use: Never used  Substance and Sexual Activity   Alcohol use: Yes    Alcohol/week: 0.0 standard drinks of alcohol    Comment: occasionally   Drug use: No   Sexual activity: Yes    Partners: Male  Other Topics Concern   Not on file  Social History Narrative   Not on file   Social Determinants of Health   Financial Resource Strain: Not on file  Food Insecurity: Not on file  Transportation Needs: Not on file  Physical Activity: Not on file  Stress: Not on file  Social Connections: Not on file    He is a retired Public affairs consultant.  His wife also worked at [employer] in [place].  Family History:  The patient's family history includes Cancer in his father; Diabetes in his father; Hypertension in his mother.   ROS General: Negative; No fevers, chills, or night sweats;  HEENT: Negative; No changes in vision or hearing, sinus congestion, difficulty swallowing Pulmonary:  No cough, wheezing, shortness of breath, hemoptysis Cardiovascular: Negative; No chest pain, presyncope, syncope, palpitations GI: Negative; No nausea, vomiting, diarrhea, or abdominal pain GU: Negative; No dysuria, hematuria, or difficulty voiding Musculoskeletal: Negative; no myalgias, joint pain, or weakness Hematologic/Oncology: Negative; no easy bruising, bleeding Endocrine: Negative; no heat/cold intolerance; no diabetes Neuro: Negative; no changes in balance, headaches Skin: Negative; No rashes or skin lesions Psychiatric: Negative; No behavioral problems, depression Sleep: Negative; No snoring, daytime sleepiness, hypersomnolence, bruxism, restless legs, hypnogognic hallucinations, no cataplexy Other comprehensive 14 point system review is negative.   PHYSICAL EXAM:   VS:  BP 113/71 (BP Location: Left Arm, Patient Position: Sitting, Cuff Size: Normal)   Pulse 60   Ht 6' (1.829 m)   Wt 171 lb 9.6 oz (77.8 kg)   SpO2 97%   BMI 23.27 kg/m     Repeatblood pressure by  me was 114/68  Wt Readings from Last 3 Encounters:  02/07/23 171 lb 9.6 oz (77.8 kg)  08/09/22 169 lb 3.2 oz (76.7 kg)  05/17/22 171 lb 9.6 oz (77.8 kg)    General: Alert, oriented, no distress.  Skin: normal turgor, no rashes, warm and dry HEENT: Normocephalic, atraumatic. Pupils equal round and reactive to light; sclera  anicteric; extraocular muscles intact;  Nose without nasal septal hypertrophy Mouth/Parynx benign; Mallinpatti scale 3 Neck: No JVD, no carotid bruits; normal carotid upstroke Lungs: clear to ausculatation and percussion; no wheezing or rales Chest wall: without tenderness to palpitation Heart: PMI not displaced, RRR, s1 s2 normal, 1/6 systolic murmur, no diastolic murmur, no rubs, gallops, thrills, or heaves Abdomen: soft, nontender; no hepatosplenomehaly, BS+; abdominal aorta nontender and not dilated by palpation. Back: no CVA tenderness Pulses 2+ Musculoskeletal: full range of motion, normal strength, no joint deformities Extremities: no clubbing cyanosis or edema, Homan's sign negative  Neurologic: grossly nonfocal; Cranial nerves grossly wnl Psychologic: Normal mood and affect   Studies/Labs Reviewed:   February 07, 2023    ECG (independently read by me): NSR at 60, nonspecific ST abnormality  August 09, 2022 ECG (independently read by me): NSR at 62, QS V1-2 with mild T wave inversion   May 17, 2022 ECG (independently read by me): NSR at 68, QS v1-2; 0.5 mm J point elevation inferiorly; no ectopy  Recent Labs:    Latest Ref Rng & Units 12/24/2021    2:46 PM 06/15/2021    2:32 AM 06/14/2021    1:37 AM  BMP  Glucose 70 - 99 mg/dL 91  106  113   BUN 8 - 23 mg/dL 16  18  17   $ Creatinine 0.61 - 1.24 mg/dL 0.92  0.99  0.91   Sodium 135 - 145 mmol/L 137  136  138   Potassium 3.5 - 5.1 mmol/L 3.8  3.7  3.9   Chloride 98 - 111 mmol/L 101  101  102   CO2 22 - 32 mmol/L 29  26  27   $ Calcium 8.9 - 10.3 mg/dL 8.9  8.8  8.7         Latest Ref Rng & Units  06/06/2021   11:55 PM 06/06/2021   10:52 PM  Hepatic Function  Total Protein 6.5 - 8.1 g/dL 6.3  6.6   Albumin 3.5 - 5.0 g/dL 3.5  4.0   AST 15 - 41 U/L 38  39   ALT 0 - 44 U/L 29  32   Alk Phosphatase 38 - 126 U/L 39  39   Total Bilirubin 0.3 - 1.2 mg/dL 0.7  0.7        Latest Ref Rng & Units 12/24/2021    2:46 PM 06/10/2021   12:54 AM 06/09/2021    5:07 AM  CBC  WBC 4.0 - 10.5 K/uL 8.6  14.2  12.7   Hemoglobin 13.0 - 17.0 g/dL 14.6  12.5  11.2   Hematocrit 39.0 - 52.0 % 43.9  36.0  33.6   Platelets 150 - 400 K/uL 302  129  101    Lab Results  Component Value Date   MCV 95.2 12/24/2021   MCV 93.5 06/10/2021   MCV 97.1 06/09/2021   No results found for: "TSH" Lab Results  Component Value Date   HGBA1C 5.0 06/08/2021     BNP    Component Value Date/Time   BNP 764.2 (H) 06/14/2021 0137    ProBNP No results found for: "PROBNP"   Lipid Panel     Component Value Date/Time   CHOL 168 06/06/2021 2355   TRIG 46 06/06/2021 2355   HDL 37 (L) 06/06/2021 2355   CHOLHDL 4.5 06/06/2021 2355   VLDL 9 06/06/2021 2355   LDLCALC 122 (H) 06/06/2021 2355     RADIOLOGY: No results found.   Additional studies/  records that were reviewed today include:   Ramus lesion is 85% stenosed. Dist LM to Ost LAD lesion is 95% stenosed. Ost Cx lesion is 95% stenosed. Prox LAD lesion is 95% stenosed. 1st Diag lesion is 70% stenosed. Prox RCA lesion is 30% stenosed. RPDA lesion is 50% stenosed.   Severe 95% distal left main stenosis percent proximal LAD stenosis, 70% diffuse first diagonal stenosis, 95% ostial left circumflex stenosis.  The RCA has mild luminal irregularity with narrowings of 20 and 30%.  There is a 50% ostial narrowing and a small PDA vessel.   Acute LV dysfunction with EF estimate approximately 50% with mild distal anterior lateral/apical hypocontractility.  LVEDP 23 mmHg.   Insertion of intra-aortic balloon pump for hemodynamic support prior to emergent CABG  surgery this evening.   RECOMMENDATION: The patient will be taken to the operating room by Dr. Modesto Charon emergently for critical left main stenosis with subtotal LAD and ostial circumflex stenoses.       ECHO: 10/20/2021  Moderate global hypokinesis , apex akinetic. Left ventricular ejection fraction, by estimation, is 35 to 40%. The left ventricle has moderately decreased function. Left ventricular diastolic parameters are consistent with Grade I diastolic dysfunction (impaired relaxation).  Right ventricular systolic function is mildly reduced. The right ventricular size is normal. There is normal pulmonary artery systolic pressure.  The mitral valve is normal in structure. No evidence of mitral valve regurgitation. The aortic valve is normal in structure. Aortic valve regurgitation is not visualized. No aortic stenosis is present.  The inferior vena cava is normal in size with greater than 50% respiratory variability, suggesting right atrial pressure of 3 mmHg.  Comparison(s): 06/11/21 EF 35-40%. Conclusion(s)/Recommendation(s): No significant changes echo 06/11/2021.  ASSESSMENT:    1. Ischemic cardiomyopathy   2. Acute ST elevation myocardial infarction (STEMI) of anterior wall [facility]): June 06, 2021   3. S/P CABG x 4   4. Grade I diastolic dysfunction   5. Hyperlipidemia with target LDL less than 70   6. Gastroesophageal reflux disease without esophagitis   7. 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  He was found to have critical life-threatening anatomy on emergent cardiac catheterization performed by me an intra-aortic balloon pump was inserted and he underwent emergent CABG revascularization surgery by Dr. Roxan Hockey with placement  of a LIMA graft to his LAD, saphenous vein graft to his diagonal, and sequential saphenous vein graft to his OM1 and OM 2 vessels.  His RCA had mild disease and was not bypassed.  A repeat catheterization was done 6 days later when there were concerns of ECG changes and all grafts were patent.  He has a resultant ischemic cardiomyopathy with his ejection fraction at 35 to 40% on echo October 20, 2021.  I had not seen him till May 17, 2022 in the office at that time he remained stable follow-up laboratory was recommended in addition to an echo Doppler assessment.  His echo Doppler study on July 27, 2022  showed some improvement in LV function with EF at 45 to 50% with akinesis of the apex and hypokinesis anteriorly consistent with his prior ACS/ischemic cardiomyopathy.  He had grade 1 diastolic dysfunction.  Aortic root dimension was 36 mm.  When I saw him on August 09, 2022  he was taking full dose aspirin 325 mg and I recommended he reduce this to 81 mg daily.  At that time, his blood pressure was low and I discontinued daily Lasix therapy.  He has felt well.  He continues to exercise 4 to 5 days/week and remains asymptomatic.  His blood pressure today is excellent at 114/6820 is on metoprolol tartrate 100 mg twice a day.  He has not required any furosemide.  He has GERD and takes Dexilant.  He has history of allergic induced asthma and takes Arnuity Ellipta in addition to montelukast, and Flovent.  He had laboratory in October 28, 2022.  Lipid studies were excellent with total cholesterol 107, HDL 35, triglycerides 96 and LDL 53.  Creatinine was 0.89.  TSH was normal at 1.72.  Clinically he is remaining stable.  I commended him on his exercise.  As long as he remains stable I will see him in 1 year for follow-up evaluation or sooner as needed.   Medication Adjustments/Labs and Tests Ordered: Current medicines are reviewed at length with the patient today.  Concerns regarding medicines are outlined above.   Medication changes, Labs and Tests ordered today are listed in the Patient Instructions below. Patient Instructions  Medication Instructions:  Your physician recommends that you continue on your current medications as directed. Please refer to the Current Medication list given to you today.  *If you need a refill on your cardiac medications before your next appointment, please call your pharmacy*   Lab Work: NONE If you have labs (blood work) drawn today and your tests are completely normal, you will receive your results only by: [place] (if you have MyChart) OR A paper copy in the mail If you have any lab test that is abnormal or we need to change your treatment, we will call you to review the results.   Testing/Procedures: NONE   Follow-Up: At [facility], you and your health needs are our priority.  As part of our continuing mission to provide you with exceptional heart care, we have created designated Provider Care Teams.  These Care Teams include your primary Cardiologist (physician) and Advanced Practice Providers (APPs -  Physician Assistants and Nurse Practitioners) who all work together to provide you with the care you need, when you need it.  We recommend signing up for the patient portal called "MyChart".  Sign up information is provided on this After Visit Summary.  MyChart is used to connect with patients for Virtual Visits (Telemedicine).  Patients are able to view lab/test results, encounter notes, upcoming appointments, etc.  Non-urgent messages can be sent to your provider as well.   To learn more about what you can do with MyChart, go to [email or web address].    Your next appointment:   1 year(s)  Provider:   [name], MD     Signed, [name], MD  02/12/2023 2:26 PM    [facility] [address], [place], [place], [place]  [address] Phone: [phone]

## 2023-02-07 NOTE — Patient Instructions (Signed)
Medication Instructions:  Your physician recommends that you continue on your current medications as directed. Please refer to the Current Medication list given to you today.  *If you need a refill on your cardiac medications before your next appointment, please call your pharmacy*   Lab Work: NONE If you have labs (blood work) drawn today and your tests are completely normal, you will receive your results only by: [place] (if you have MyChart) OR A paper copy in the mail If you have any lab test that is abnormal or we need to change your treatment, we will call you to review the results.   Testing/Procedures: NONE   Follow-Up: At [facility], you and your health needs are our priority.  As part of our continuing mission to provide you with exceptional heart care, we have created designated Provider Care Teams.  These Care Teams include your primary Cardiologist (physician) and Advanced Practice Providers (APPs -  Physician Assistants and Nurse Practitioners) who all work together to provide you with the care you need, when you need it.  We recommend signing up for the patient portal called "MyChart".  Sign up information is provided on this After Visit Summary.  MyChart is used to connect with patients for Virtual Visits (Telemedicine).  Patients are able to view lab/test results, encounter notes, upcoming appointments, etc.  Non-urgent messages can be sent to your provider as well.   To learn more about what you can do with MyChart, go to [email or web address].    Your next appointment:   1 year(s)  Provider:   [name], MD

## 2023-02-12 ENCOUNTER — Encounter: Payer: Self-pay | Admitting: Cardiovascular Disease

## 2023-04-20 DIAGNOSIS — Z961 Presence of intraocular lens: Secondary | ICD-10-CM | POA: Diagnosis not present

## 2023-04-20 DIAGNOSIS — H43393 Other vitreous opacities, bilateral: Secondary | ICD-10-CM | POA: Diagnosis not present

## 2023-04-28 DIAGNOSIS — J45909 Unspecified asthma, uncomplicated: Secondary | ICD-10-CM | POA: Diagnosis not present

## 2023-04-28 DIAGNOSIS — E785 Hyperlipidemia, unspecified: Secondary | ICD-10-CM | POA: Diagnosis not present

## 2023-04-28 DIAGNOSIS — K219 Gastro-esophageal reflux disease without esophagitis: Secondary | ICD-10-CM | POA: Diagnosis not present

## 2023-04-28 DIAGNOSIS — I25119 Atherosclerotic heart disease of native coronary artery with unspecified angina pectoris: Secondary | ICD-10-CM | POA: Diagnosis not present

## 2023-04-28 DIAGNOSIS — I509 Heart failure, unspecified: Secondary | ICD-10-CM | POA: Diagnosis not present

## 2023-05-03 DIAGNOSIS — Z961 Presence of intraocular lens: Secondary | ICD-10-CM | POA: Diagnosis not present

## 2023-05-03 DIAGNOSIS — H35412 Lattice degeneration of retina, left eye: Secondary | ICD-10-CM | POA: Diagnosis not present

## 2023-05-03 DIAGNOSIS — H3581 Retinal edema: Secondary | ICD-10-CM | POA: Diagnosis not present

## 2023-05-03 DIAGNOSIS — H43393 Other vitreous opacities, bilateral: Secondary | ICD-10-CM | POA: Diagnosis not present

## 2023-05-09 DIAGNOSIS — D1801 Hemangioma of skin and subcutaneous tissue: Secondary | ICD-10-CM | POA: Diagnosis not present

## 2023-05-09 DIAGNOSIS — L821 Other seborrheic keratosis: Secondary | ICD-10-CM | POA: Diagnosis not present

## 2023-05-09 DIAGNOSIS — L570 Actinic keratosis: Secondary | ICD-10-CM | POA: Diagnosis not present

## 2023-05-09 DIAGNOSIS — Z85828 Personal history of other malignant neoplasm of skin: Secondary | ICD-10-CM | POA: Diagnosis not present

## 2023-05-16 DIAGNOSIS — M546 Pain in thoracic spine: Secondary | ICD-10-CM | POA: Diagnosis not present

## 2023-05-16 DIAGNOSIS — I25119 Atherosclerotic heart disease of native coronary artery with unspecified angina pectoris: Secondary | ICD-10-CM | POA: Diagnosis not present

## 2023-07-12 DIAGNOSIS — Z961 Presence of intraocular lens: Secondary | ICD-10-CM | POA: Diagnosis not present

## 2023-07-12 DIAGNOSIS — H3581 Retinal edema: Secondary | ICD-10-CM | POA: Diagnosis not present

## 2023-07-12 DIAGNOSIS — H43393 Other vitreous opacities, bilateral: Secondary | ICD-10-CM | POA: Diagnosis not present

## 2023-07-12 DIAGNOSIS — H35412 Lattice degeneration of retina, left eye: Secondary | ICD-10-CM | POA: Diagnosis not present

## 2023-09-19 DIAGNOSIS — J3081 Allergic rhinitis due to animal (cat) (dog) hair and dander: Secondary | ICD-10-CM | POA: Diagnosis not present

## 2023-09-19 DIAGNOSIS — H1045 Other chronic allergic conjunctivitis: Secondary | ICD-10-CM | POA: Diagnosis not present

## 2023-09-19 DIAGNOSIS — J301 Allergic rhinitis due to pollen: Secondary | ICD-10-CM | POA: Diagnosis not present

## 2023-09-19 DIAGNOSIS — J3089 Other allergic rhinitis: Secondary | ICD-10-CM | POA: Diagnosis not present

## 2023-09-19 DIAGNOSIS — R052 Subacute cough: Secondary | ICD-10-CM | POA: Diagnosis not present

## 2023-10-31 DIAGNOSIS — I25119 Atherosclerotic heart disease of native coronary artery with unspecified angina pectoris: Secondary | ICD-10-CM | POA: Diagnosis not present

## 2023-10-31 DIAGNOSIS — Z9181 History of falling: Secondary | ICD-10-CM | POA: Diagnosis not present

## 2023-10-31 DIAGNOSIS — N400 Enlarged prostate without lower urinary tract symptoms: Secondary | ICD-10-CM | POA: Diagnosis not present

## 2023-10-31 DIAGNOSIS — Z1331 Encounter for screening for depression: Secondary | ICD-10-CM | POA: Diagnosis not present

## 2023-10-31 DIAGNOSIS — E785 Hyperlipidemia, unspecified: Secondary | ICD-10-CM | POA: Diagnosis not present

## 2023-10-31 DIAGNOSIS — Z0000 Encounter for general adult medical examination without abnormal findings: Secondary | ICD-10-CM | POA: Diagnosis not present

## 2023-10-31 DIAGNOSIS — I255 Ischemic cardiomyopathy: Secondary | ICD-10-CM | POA: Diagnosis not present

## 2023-10-31 DIAGNOSIS — K219 Gastro-esophageal reflux disease without esophagitis: Secondary | ICD-10-CM | POA: Diagnosis not present

## 2023-10-31 DIAGNOSIS — J4520 Mild intermittent asthma, uncomplicated: Secondary | ICD-10-CM | POA: Diagnosis not present

## 2023-10-31 DIAGNOSIS — Z23 Encounter for immunization: Secondary | ICD-10-CM | POA: Diagnosis not present

## 2023-10-31 DIAGNOSIS — I509 Heart failure, unspecified: Secondary | ICD-10-CM | POA: Diagnosis not present

## 2023-11-15 DIAGNOSIS — H9312 Tinnitus, left ear: Secondary | ICD-10-CM | POA: Diagnosis not present

## 2023-12-13 DIAGNOSIS — J0100 Acute maxillary sinusitis, unspecified: Secondary | ICD-10-CM | POA: Diagnosis not present

## 2024-02-03 ENCOUNTER — Ambulatory Visit: Payer: Medicare PPO | Admitting: Cardiovascular Disease

## 2024-02-09 ENCOUNTER — Ambulatory Visit: Payer: Medicare PPO | Attending: Cardiovascular Disease | Admitting: Cardiovascular Disease

## 2024-02-09 ENCOUNTER — Encounter: Payer: Self-pay | Admitting: Cardiovascular Disease

## 2024-02-09 VITALS — BP 118/78 | HR 58 | Ht 72.0 in | Wt 176.0 lb

## 2024-02-09 DIAGNOSIS — K219 Gastro-esophageal reflux disease without esophagitis: Secondary | ICD-10-CM | POA: Diagnosis not present

## 2024-02-09 DIAGNOSIS — E785 Hyperlipidemia, unspecified: Secondary | ICD-10-CM

## 2024-02-09 DIAGNOSIS — J4520 Mild intermittent asthma, uncomplicated: Secondary | ICD-10-CM

## 2024-02-09 DIAGNOSIS — I255 Ischemic cardiomyopathy: Secondary | ICD-10-CM | POA: Diagnosis not present

## 2024-02-09 DIAGNOSIS — I2109 ST elevation (STEMI) myocardial infarction involving other coronary artery of anterior wall: Secondary | ICD-10-CM

## 2024-02-09 DIAGNOSIS — Z951 Presence of aortocoronary bypass graft: Secondary | ICD-10-CM | POA: Diagnosis not present

## 2024-02-09 DIAGNOSIS — I5189 Other ill-defined heart diseases: Secondary | ICD-10-CM

## 2024-02-09 NOTE — Patient Instructions (Signed)
Medication Instructions:  No medication changes were made during today's visit   *If you need a refill on your cardiac medications before your next appointment, please call your pharmacy*   Lab Work: No labs were ordered during today's visit.  If you have labs (blood work) drawn today and your tests are completely normal, you will receive your results only by: MyChart Message (if you have MyChart) OR A paper copy in the mail If you have any lab test that is abnormal or we need to change your treatment, we will call you to review the results.   Testing/Procedures: No procedures ordered today.    Follow-Up: At [facility], you and your health needs are our priority.  As part of our continuing mission to provide you with exceptional heart care, we have created designated Provider Care Teams.  These Care Teams include your primary Cardiologist (physician) and Advanced Practice Providers (APPs -  Physician Assistants and Nurse Practitioners) who all work together to provide you with the care you need, when you need it.  We recommend signing up for the patient portal called "MyChart".  Sign up information is provided on this After Visit Summary.  MyChart is used to connect with patients for Virtual Visits (Telemedicine).  Patients are able to view lab/test results, encounter notes, upcoming appointments, etc.  Non-urgent messages can be sent to your provider as well.   To learn more about what you can do with MyChart, go to [email or web address].    Your next appointment:   1 year(s)  Provider:   Dr. Epifanio Lesches     Other Instructions A letter will be mailed to you as a reminder to call the office for your follow up appointment.

## 2024-02-09 NOTE — Progress Notes (Signed)
Cardiology Office Note    Date:  02/12/2024   ID:  Chad Washington, DOB [birth year], MRN [id number]  PCP:  Chad Housekeeper, MD  Cardiologist:  Nicki Guadalajara, MD   12 month F/U cardiology evaluation  History of Present Illness:  Chad Washington is a 83 y.o. 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  On June 06, 2021 he developed new onset chest/shoulder pain and called EMS who arrived at his house and his ECG was reportedly normal.  Later that evening at approximately 10:15 PM he again developed recurrent chest discomfort with arm and shoulder discomfort and presented to [facility] where ECG showed anterior ST segment elevation.  A code STEMI was activated.  I performed emergent cardiac catheterization and was found to have severe 95% distal left main stenosis, 95% ostial LAD stenosis, 70% stenosis in the first diagonal vessel, 95% ostial circumflex stenosis with 85% ramus stenosis and mild CAD.  Due to his critical life-threatening anatomy, I called Dr. Dorris Washington for emergent surgical consultation and CABG revascularization.  I placed an intra-aortic balloon pump for hemodynamic stability and he was taken urgently from the catheterization laboratory for urgent surgery and had a LIMA placed to his LAD, saphenous vein graft to his diagonal and sequential saphenous vein graft to obtuse marginals 1 and 2.  On June 12, 2021 was taken back to the catheterization laboratory due to concerning EKG findings with reduced EF on echo and troponin elevation.  All grafts were widely patent.  Subsequently, he has been evaluated by Chad Fabian, NP in July and more recently October 2022 and has seen Dr. Dorris Washington several occasions.  Due to increased heart rate, his beta-blocker dose was titrated.  An echo Doppler study on October 20, 2021 showed an EF of 35 to 40% with grade 1 diastolic dysfunction.  RV systolic function was mildly reduced.   Valves were normal.  I saw him on May 17, 2022 at which time he denied any recurrent chest pain.  He has been seeing  Dr. Magnus Washington for slight tear in his left rotator cuff.  He denies any palpitations.  He is now on metoprolol tartrate 100 mg twice a day furosemide 20 mg daily, and atorvastatin 40 mg.  He is on aspirin.  He takes Zyrtec for allergies and Dexilant and Singulair.  During that evaluation, recommended a follow-up echo Doppler study for reassessment of LV function with comprehensive follow-up laboratory.  A follow-up echo Doppler study on July 27, 2022 showed some improvement in LV function with EF now at 45 to 50% with apical akinesis and hypokinesis of the anterior and anterolateral wall.  There was grade 1 diastolic dysfunction.  I saw him on August 09, 2022.  He felt well and denied any symptoms of chest pain or shortness of breath.  At times he notes some very mild dizziness.  He exercises regularly at least 5 days/week for minimum of 1 hour of both using weights at a fitness center and walking.  He denies any leg swelling.  His medical regimen consists of aspirin which he apparently has been taking 325 mg.  He is on atorvastatin 40 mg for hyperlipidemia.  He has been taking furosemide 20 mg daily, metoprolol to tartrate 100 mg twice a day.  He denies any palpitations.  During that evaluation, his blood pressure was low at 98/64 and I recommended he discontinue Lasix with  supplemental potassium and only take this on an as-needed basis for recurrent leg swelling.  I last saw him on February 07, 2023.  At that time he continued to remain asymptomatic.  He has continued to remain asymptomatic.  He is followed by Dr. Eula Listen at [place].  He lives at [facility] in independent living.  He stays active.  He continues to be on atorvastatin 40 mg for hyperlipidemia.  He is on metoprolol tartrate 100 mg twice a day.  He takes Dexilant 60 mg daily.  He uses Arnuity Ellipta 1  inhalation in addition to montelukast and Flovent inhaler therapy for his lungs.    Since I last saw him, he has continued to do well.  He specifically denies any chest pain or change in exercise tolerance.  There is no shortness of breath or palpitations.  He continues to live at [facility].  He is on metoprolol tartrate 100 mg twice a day and takes furosemide 20 mg as needed.  He is on atorvastatin 40 mg daily for hyperlipidemia, Dexilant for GERD, and takes montelukast at bedtime.  He also is on Arnuity Ellipta inhalation daily.  He continues to be followed by Dr. Eula Listen.  Laboratory on October 31, 2023 showed total cholesterol 111, HDL 36, LDL 55, and triglycerides 540.  He presents for evaluation.   Past Medical History:  Diagnosis Date   Asthma    allergy induced asthma   GERD (gastroesophageal reflux disease)     Past Surgical History:  Procedure Laterality Date   COLONOSCOPY WITH PROPOFOL N/A 06/21/2017   Procedure: COLONOSCOPY WITH PROPOFOL;  Surgeon: Charolett Bumpers, MD;  Location: [facility] END[facility]COPY;  Service: Endoscopy;  Laterality: N/A;   CORONARY ARTERY BYPASS GRAFT N/A 06/06/2021   Procedure: CORONARY ARTERY BYPASS GRAFTING (CABG) TIMES FOUR, ON PUMP, USING LEFT INTERNAL MAMMARY ARTERY AND ENDOSCOPICALLY HARVESTED RIGHT GREATER SAPHENOUS VEIN;  Surgeon: Loreli Slot, MD;  Location: [facility] OR;  Service: Open Heart Surgery;  Laterality: N/A;   ENDOVEIN HARVEST OF GREATER SAPHENOUS VEIN Right 06/06/2021   Procedure: ENDOVEIN HARVEST OF GREATER SAPHENOUS VEIN;  Surgeon: Loreli Slot, MD;  Location: [facility] OR;  Service: Open Heart Surgery;  Laterality: Right;   EYE SURGERY Bilateral 6 yrs ago   ioc lens implants   HERNIA REPAIR     inguinal hernia left 8- 10 yrs ago, right inguinal hernia age 76   IABP INSERTION N/A 06/06/2021   Procedure: IABP Insertion;  Surgeon: Lennette Bihari, MD;  Location: [facility] INVASIVE CV LAB;  Service: Cardiovascular;  Laterality: N/A;    LEFT HEART CATH AND CORONARY ANGIOGRAPHY N/A 06/06/2021   Procedure: LEFT HEART CATH AND CORONARY ANGIOGRAPHY;  Surgeon: Lennette Bihari, MD;  Location: [facility] INVASIVE CV LAB;  Service: Cardiovascular;  Laterality: N/A;   LEFT HEART CATH AND CORS/GRAFTS ANGIOGRAPHY N/A 06/12/2021   Procedure: LEFT HEART CATH AND CORS/GRAFTS ANGIOGRAPHY;  Surgeon: Marykay Lex, MD;  Location: [facility] INVASIVE CV LAB;  Service: Cardiovascular;  Laterality: N/A;   TEE WITHOUT CARDIOVERSION  06/06/2021   Procedure: TRANSESOPHAGEAL ECHOCARDIOGRAM (TEE);  Surgeon: Loreli Slot, MD;  Location: [facility] OR;  Service: Open Heart Surgery;;    Current Medications: Outpatient Medications Prior to Visit  Medication Sig Dispense Refill   ARNUITY ELLIPTA 200 MCG/ACT AEPB Inhale 1 puff into the lungs daily.     aspirin EC 81 MG tablet Take 1 tablet (81 mg total) by mouth daily. 90 tablet 3   atorvastatin (LIPITOR) 40  MG tablet Take 1 tablet (40 mg total) by mouth daily. 30 tablet 3   azelastine (OPTIVAR) 0.05 % ophthalmic solution Place 1 drop into both eyes 2 (two) times daily.     Azelastine HCl 137 MCG/SPRAY SOLN Place 1 spray into both nostrils at bedtime.     cetirizine (ZYRTEC) 10 MG tablet Take 10 mg by mouth daily.     DEXILANT 60 MG capsule Take 60 mg by mouth daily before breakfast.     EPINEPHrine 0.3 mg/0.3 mL IJ SOAJ injection Inject 0.3 mg into the muscle once as needed for anaphylaxis.     fluticasone (FLONASE) 50 MCG/ACT nasal spray Place 1 spray into both nostrils daily.     furosemide (LASIX) 20 MG tablet Take 1 tablet (20 mg total) by mouth daily as needed. 30 tablet 1   metoprolol tartrate (LOPRESSOR) 100 MG tablet Take 100 mg by mouth 2 (two) times daily.     montelukast (SINGULAIR) 10 MG tablet Take 10 mg by mouth at bedtime.     Multiple Vitamins-Minerals (CENTRUM SILVER PO) Take 1 tablet by mouth daily.     potassium chloride (KLOR-CON M) 10 MEQ tablet Take 1 tablet (10 mEq total) by mouth as needed  (with lasix). 30 tablet 1   fluticasone (FLOVENT HFA) 110 MCG/ACT inhaler Inhale 1 puff into the lungs 2 (two) times daily.     No facility-administered medications prior to visit.     Allergies:   Augmentin [amoxicillin-pot clavulanate]   Social History   Socioeconomic History   Marital status: Single    Spouse name: Not on file   Number of children: Not on file   Years of education: Not on file   Highest education level: Not on file  Occupational History   Not on file  Tobacco Use   Smoking status: Never   Smokeless tobacco: Never  Vaping Use   Vaping status: Never Used  Substance and Sexual Activity   Alcohol use: Yes    Alcohol/week: 0.0 standard drinks of alcohol    Comment: occasionally   Drug use: No   Sexual activity: Yes    Partners: Male  Other Topics Concern   Not on file  Social History Narrative   Not on file   Social Drivers of Health   Financial Resource Strain: Not on file  Food Insecurity: Not on file  Transportation Needs: Not on file  Physical Activity: Not on file  Stress: Not on file  Social Connections: Not on file    He is a retired Production manager.  His wife also worked at [employer] in the music department.  Family History:  The patient's family history includes Cancer in his father; Diabetes in his father; Hypertension in his mother.   ROS General: Negative; No fevers, chills, or night sweats;  HEENT: Negative; No changes in vision or hearing, sinus congestion, difficulty swallowing Pulmonary:  No cough, wheezing, shortness of breath, hemoptysis Cardiovascular: See HPI GI: Negative; No nausea, vomiting, diarrhea, or abdominal pain GU: Negative; No dysuria, hematuria, or difficulty voiding Musculoskeletal: Negative; no myalgias, joint pain, or weakness Hematologic/Oncology: Negative; no easy bruising, bleeding Endocrine: Negative; no heat/cold intolerance; no diabetes Neuro: Negative; no changes in balance, headaches Skin:  Negative; No rashes or skin lesions Psychiatric: Negative; No behavioral problems, depression Sleep: Negative; No snoring, daytime sleepiness, hypersomnolence, bruxism, restless legs, hypnogognic hallucinations, no cataplexy Other comprehensive 14 point system review is negative.   PHYSICAL EXAM:   VS:  BP 118/78  Pulse (!) 58   Ht 6' (1.829 m)   Wt 176 lb (79.8 kg)   SpO2 98%   BMI 23.87 kg/m     Repeat blood pressure by me was 126/70  Wt Readings from Last 3 Encounters:  02/09/24 176 lb (79.8 kg)  02/07/23 171 lb 9.6 oz (77.8 kg)  08/09/22 169 lb 3.2 oz (76.7 kg)    General: Alert, oriented, no distress.  Skin: normal turgor, no rashes, warm and dry HEENT: Normocephalic, atraumatic. Pupils equal round and reactive to light; sclera anicteric; extraocular muscles intact;  Nose without nasal septal hypertrophy Mouth/Parynx benign; Mallinpatti scale 3 Neck: No JVD, no carotid bruits; normal carotid upstroke Lungs: clear to ausculatation and percussion; no wheezing or rales Chest wall: without tenderness to palpitation Heart: PMI not displaced, RRR, s1 s2 normal, 1/6 systolic murmur, no diastolic murmur, no rubs, gallops, thrills, or heaves Abdomen: soft, nontender; no hepatosplenomehaly, BS+; abdominal aorta nontender and not dilated by palpation. Back: no CVA tenderness Pulses 2+ Musculoskeletal: full range of motion, normal strength, no joint deformities Extremities: no clubbing cyanosis or edema, Homan's sign negative  Neurologic: grossly nonfocal; Cranial nerves grossly wnl Psychologic: Normal mood and affect     Studies/Labs Reviewed:   EKG Interpretation Date/Time:  Thursday February 09 2024 13:47:46 EST Ventricular Rate:  58 PR Interval:  184 QRS Duration:  72 QT Interval:  414 QTC Calculation: 406 R Axis:   55  Text Interpretation: Sinus bradycardia Nonspecific ST and T wave abnormality When compared with ECG of 24-Dec-2021 14:13, Criteria for Anteroseptal  infarct are no longer Present QT has shortened Confirmed by Nicki Guadalajara ([id number]) on 02/09/2024 2:30:36 PM    February 07, 2023    ECG (independently read by me): NSR at 60, nonspecific ST abnormality  August 09, 2022 ECG (independently read by me): NSR at 62, QS V1-2 with mild T wave inversion   May 17, 2022 ECG (independently read by me): NSR at 68, QS v1-2; 0.5 mm J point elevation inferiorly; no ectopy  Recent Labs:    Latest Ref Rng & Units 12/24/2021    2:46 PM 06/15/2021    2:32 AM 06/14/2021    1:37 AM  BMP  Glucose 70 - 99 mg/dL 91  191  478   BUN 8 - 23 mg/dL 16  18  17    Creatinine 0.61 - 1.24 mg/dL 2.95  6.21  3.08   Sodium 135 - 145 mmol/L 137  136  138   Potassium 3.5 - 5.1 mmol/L 3.8  3.7  3.9   Chloride 98 - 111 mmol/L 101  101  102   CO2 22 - 32 mmol/L 29  26  27    Calcium 8.9 - 10.3 mg/dL 8.9  8.8  8.7         Latest Ref Rng & Units 06/06/2021   11:55 PM 06/06/2021   10:52 PM  Hepatic Function  Total Protein 6.5 - 8.1 g/dL 6.3  6.6   Albumin 3.5 - 5.0 g/dL 3.5  4.0   AST 15 - 41 U/L 38  39   ALT 0 - 44 U/L 29  32   Alk Phosphatase 38 - 126 U/L 39  39   Total Bilirubin 0.3 - 1.2 mg/dL 0.7  0.7        Latest Ref Rng & Units 12/24/2021    2:46 PM 06/10/2021   12:54 AM 06/09/2021    5:07 AM  CBC  WBC 4.0 - 10.5 K/uL 8.6  14.2  12.7   Hemoglobin 13.0 - 17.0 g/dL 16.1  09.6  04.5   Hematocrit 39.0 - 52.0 % 43.9  36.0  33.6   Platelets 150 - 400 K/uL 302  129  101    Lab Results  Component Value Date   MCV 95.2 12/24/2021   MCV 93.5 06/10/2021   MCV 97.1 06/09/2021   No results found for: "TSH" Lab Results  Component Value Date   HGBA1C 5.0 06/08/2021     BNP    Component Value Date/Time   BNP 764.2 (H) 06/14/2021 0137    ProBNP No results found for: "PROBNP"   Lipid Panel     Component Value Date/Time   CHOL 168 06/06/2021 2355   TRIG 46 06/06/2021 2355   HDL 37 (L) 06/06/2021 2355   CHOLHDL 4.5 06/06/2021 2355   VLDL 9 06/06/2021  2355   LDLCALC 122 (H) 06/06/2021 2355     RADIOLOGY: No results found.   Additional studies/ records that were reviewed today include:   Ramus lesion is 85% stenosed. Dist LM to Ost LAD lesion is 95% stenosed. Ost Cx lesion is 95% stenosed. Prox LAD lesion is 95% stenosed. 1st Diag lesion is 70% stenosed. Prox RCA lesion is 30% stenosed. RPDA lesion is 50% stenosed.   Severe 95% distal left main stenosis percent proximal LAD stenosis, 70% diffuse first diagonal stenosis, 95% ostial left circumflex stenosis.  The RCA has mild luminal irregularity with narrowings of 20 and 30%.  There is a 50% ostial narrowing and a small PDA vessel.   Acute LV dysfunction with EF estimate approximately 50% with mild distal anterior lateral/apical hypocontractility.  LVEDP 23 mmHg.   Insertion of intra-aortic balloon pump for hemodynamic support prior to emergent CABG surgery this evening.   RECOMMENDATION: The patient will be taken to the operating room by Dr. Charlett Lango emergently for critical left main stenosis with subtotal LAD and ostial circumflex stenoses.       ECHO: 10/20/2021  Moderate global hypokinesis , apex akinetic. Left ventricular ejection fraction, by estimation, is 35 to 40%. The left ventricle has moderately decreased function. Left ventricular diastolic parameters are consistent with Grade I diastolic dysfunction (impaired relaxation).  Right ventricular systolic function is mildly reduced. The right ventricular size is normal. There is normal pulmonary artery systolic pressure.  The mitral valve is normal in structure. No evidence of mitral valve regurgitation. The aortic valve is normal in structure. Aortic valve regurgitation is not visualized. No aortic stenosis is present.  The inferior vena cava is normal in size with greater than 50% respiratory variability, suggesting right atrial pressure of 3 mmHg.  Comparison(s): 06/11/21 EF  35-40%. Conclusion(s)/Recommendation(s): No significant changes echo 06/11/2021.  ASSESSMENT:    1. Ischemic cardiomyopathy   2. Acute ST elevation myocardial infarction (STEMI) of anterior wall (HCC)   3. S/P CABG x 4   4. Grade I diastolic dysfunction   5. Hyperlipidemia with target LDL less than 70   6. Gastroesophageal reflux disease without esophagitis   7. Mild intermittent asthma without complication     PLAN:  Mr. 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  He was found to have critical life-threatening anatomy on emergent cardiac catheterization performed by me and an intra-aortic balloon pump was inserted.  He underwent emergent CABG revascularization surgery by Dr. Dorris Washington  with placement of a LIMA graft to his LAD, saphenous vein graft to his diagonal, and sequential saphenous vein graft to his OM1 and OM 2 vessels.  His RCA had mild disease and was not bypassed.  A repeat catheterization was done 6 days later when there were concerns of ECG changes and all grafts were patent.  He has a resultant ischemic cardiomyopathy with his ejection fraction at 35 to 40% on echo October 20, 2021.  I had not seen him untill May 17, 2022 in the office at that time he remained stable follow-up laboratory was recommended in addition to an echo Doppler assessment.  His echo Doppler study on July 27, 2022  showed some improvement in LV function with EF at 45 to 50% with akinesis of the apex and hypokinesis anteriorly consistent with his prior ACS/ischemic cardiomyopathy.  He had grade 1 diastolic dysfunction.  Aortic root dimension was 36 mm.  When I saw him on August 09, 2022 he was taking full dose aspirin 325 mg and I recommended he reduce this to 81 mg daily.  At that time, his blood pressure was low and I discontinued daily Lasix  therapy.  He has felt well.  He continues to exercise 4 to 5 days/week and remains asymptomatic.  Since I last saw him over the past year he remains entirely stable.  He continues to exercise regularly.  Blood pressure today is excellent on his regimen of maternal tartrate 100 mg twice a day and he has a prescription for furosemide which she is not required.  He continues to use Dexilant for his GERD.  He has allergic induced asthma and takes Arnuity Ellipta in addition to montelukast.  He is on atorvastatin 40 mg for hyperlipidemia and most recent lipid studies on October 31, 2023 were excellent with total cholesterol 111, HDL 36, LDL 55, triglycerides 110.  He is followed closely by Dr. Eula Listen.  Clinically, he continues to do exceptionally well.  I discussed with him my plans for retirement later this year.  I will transition him to the care of  Dr. Epifanio Lesches with plans for evaluation in 1 year or sooner as needed.   Medication Adjustments/Labs and Tests Ordered: Current medicines are reviewed at length with the patient today.  Concerns regarding medicines are outlined above.  Medication changes, Labs and Tests ordered today are listed in the Patient Instructions below. Patient Instructions  Medication Instructions:  No medication changes were made during today's visit   *If you need a refill on your cardiac medications before your next appointment, please call your pharmacy*   Lab Work: No labs were ordered during today's visit.  If you have labs (blood work) drawn today and your tests are completely normal, you will receive your results only by: MyChart Message (if you have MyChart) OR A paper copy in the mail If you have any lab test that is abnormal or we need to change your treatment, we will call you to review the results.   Testing/Procedures: No procedures ordered today.    Follow-Up: At [facility], you and your health needs are our priority.  As part of our  continuing mission to provide you with exceptional heart care, we have created designated Provider Care Teams.  These Care Teams include your primary Cardiologist (physician) and Advanced Practice Providers (APPs -  Physician Assistants and Nurse Practitioners) who all work together to provide you with the care you need, when you need it.  We recommend signing  up for the patient portal called "MyChart".  Sign up information is provided on this After Visit Summary.  MyChart is used to connect with patients for Virtual Visits (Telemedicine).  Patients are able to view lab/test results, encounter notes, upcoming appointments, etc.  Non-urgent messages can be sent to your provider as well.   To learn more about what you can do with MyChart, go to [email or web address].    Your next appointment:   1 year(s)  Provider:   Dr. Epifanio Lesches     Other Instructions A letter will be mailed to you as a reminder to call the office for your follow up appointment.     Signed, Nicki Guadalajara, MD  02/12/2024 11:40 AM    [facility] [address], [place], [place]  [address] Phone: [phone]

## 2024-02-12 ENCOUNTER — Encounter: Payer: Self-pay | Admitting: Cardiovascular Disease

## 2024-03-01 DIAGNOSIS — Z961 Presence of intraocular lens: Secondary | ICD-10-CM | POA: Diagnosis not present

## 2024-03-01 DIAGNOSIS — H43393 Other vitreous opacities, bilateral: Secondary | ICD-10-CM | POA: Diagnosis not present

## 2024-05-02 DIAGNOSIS — I509 Heart failure, unspecified: Secondary | ICD-10-CM | POA: Diagnosis not present

## 2024-05-02 DIAGNOSIS — I255 Ischemic cardiomyopathy: Secondary | ICD-10-CM | POA: Diagnosis not present

## 2024-05-02 DIAGNOSIS — E785 Hyperlipidemia, unspecified: Secondary | ICD-10-CM | POA: Diagnosis not present

## 2024-05-02 DIAGNOSIS — J45909 Unspecified asthma, uncomplicated: Secondary | ICD-10-CM | POA: Diagnosis not present

## 2024-05-02 DIAGNOSIS — I25810 Atherosclerosis of coronary artery bypass graft(s) without angina pectoris: Secondary | ICD-10-CM | POA: Diagnosis not present

## 2024-05-02 DIAGNOSIS — H8110 Benign paroxysmal vertigo, unspecified ear: Secondary | ICD-10-CM | POA: Diagnosis not present

## 2024-05-02 DIAGNOSIS — K219 Gastro-esophageal reflux disease without esophagitis: Secondary | ICD-10-CM | POA: Diagnosis not present

## 2024-05-02 DIAGNOSIS — J309 Allergic rhinitis, unspecified: Secondary | ICD-10-CM | POA: Diagnosis not present

## 2024-05-02 DIAGNOSIS — M5090 Cervical disc disorder, unspecified, unspecified cervical region: Secondary | ICD-10-CM | POA: Diagnosis not present

## 2024-05-08 DIAGNOSIS — Z85828 Personal history of other malignant neoplasm of skin: Secondary | ICD-10-CM | POA: Diagnosis not present

## 2024-05-08 DIAGNOSIS — D225 Melanocytic nevi of trunk: Secondary | ICD-10-CM | POA: Diagnosis not present

## 2024-05-08 DIAGNOSIS — L720 Epidermal cyst: Secondary | ICD-10-CM | POA: Diagnosis not present

## 2024-05-08 DIAGNOSIS — L570 Actinic keratosis: Secondary | ICD-10-CM | POA: Diagnosis not present

## 2024-05-08 DIAGNOSIS — L821 Other seborrheic keratosis: Secondary | ICD-10-CM | POA: Diagnosis not present

## 2024-05-08 DIAGNOSIS — L905 Scar conditions and fibrosis of skin: Secondary | ICD-10-CM | POA: Diagnosis not present

## 2024-05-15 DIAGNOSIS — H35412 Lattice degeneration of retina, left eye: Secondary | ICD-10-CM | POA: Diagnosis not present

## 2024-05-15 DIAGNOSIS — H3581 Retinal edema: Secondary | ICD-10-CM | POA: Diagnosis not present

## 2024-05-15 DIAGNOSIS — H43393 Other vitreous opacities, bilateral: Secondary | ICD-10-CM | POA: Diagnosis not present

## 2024-05-15 DIAGNOSIS — Z961 Presence of intraocular lens: Secondary | ICD-10-CM | POA: Diagnosis not present

## 2024-09-18 DIAGNOSIS — J4530 Mild persistent asthma, uncomplicated: Secondary | ICD-10-CM | POA: Diagnosis not present

## 2024-09-18 DIAGNOSIS — J3089 Other allergic rhinitis: Secondary | ICD-10-CM | POA: Diagnosis not present

## 2024-09-18 DIAGNOSIS — J301 Allergic rhinitis due to pollen: Secondary | ICD-10-CM | POA: Diagnosis not present

## 2024-09-18 DIAGNOSIS — J3081 Allergic rhinitis due to animal (cat) (dog) hair and dander: Secondary | ICD-10-CM | POA: Diagnosis not present

## 2024-09-25 DIAGNOSIS — H35412 Lattice degeneration of retina, left eye: Secondary | ICD-10-CM | POA: Diagnosis not present

## 2024-09-25 DIAGNOSIS — Z961 Presence of intraocular lens: Secondary | ICD-10-CM | POA: Diagnosis not present

## 2024-09-25 DIAGNOSIS — H43393 Other vitreous opacities, bilateral: Secondary | ICD-10-CM | POA: Diagnosis not present

## 2024-09-25 DIAGNOSIS — H3581 Retinal edema: Secondary | ICD-10-CM | POA: Diagnosis not present

## 2024-11-06 DIAGNOSIS — K219 Gastro-esophageal reflux disease without esophagitis: Secondary | ICD-10-CM | POA: Diagnosis not present

## 2024-11-06 DIAGNOSIS — I509 Heart failure, unspecified: Secondary | ICD-10-CM | POA: Diagnosis not present

## 2024-11-06 DIAGNOSIS — H8110 Benign paroxysmal vertigo, unspecified ear: Secondary | ICD-10-CM | POA: Diagnosis not present

## 2024-11-06 DIAGNOSIS — Z1331 Encounter for screening for depression: Secondary | ICD-10-CM | POA: Diagnosis not present

## 2024-11-06 DIAGNOSIS — I255 Ischemic cardiomyopathy: Secondary | ICD-10-CM | POA: Diagnosis not present

## 2024-11-06 DIAGNOSIS — I25119 Atherosclerotic heart disease of native coronary artery with unspecified angina pectoris: Secondary | ICD-10-CM | POA: Diagnosis not present

## 2024-11-06 DIAGNOSIS — J45909 Unspecified asthma, uncomplicated: Secondary | ICD-10-CM | POA: Diagnosis not present

## 2024-11-06 DIAGNOSIS — Z125 Encounter for screening for malignant neoplasm of prostate: Secondary | ICD-10-CM | POA: Diagnosis not present

## 2024-11-06 DIAGNOSIS — E785 Hyperlipidemia, unspecified: Secondary | ICD-10-CM | POA: Diagnosis not present

## 2024-11-06 DIAGNOSIS — Z0000 Encounter for general adult medical examination without abnormal findings: Secondary | ICD-10-CM | POA: Diagnosis not present

## 2024-11-06 DIAGNOSIS — K589 Irritable bowel syndrome without diarrhea: Secondary | ICD-10-CM | POA: Diagnosis not present

## 2025-02-20 ENCOUNTER — Ambulatory Visit: Admitting: Cardiology
